# Patient Record
Sex: Male | Born: 1960 | Race: White | Hispanic: No | Smoking: Current some day smoker
Health system: Southern US, Community
[De-identification: ages and names within clinical notes are randomized; demographics above are authoritative.]

## PROBLEM LIST (undated history)

## (undated) DIAGNOSIS — G5600 Carpal tunnel syndrome, unspecified upper limb: Secondary | ICD-10-CM

## (undated) DIAGNOSIS — M1990 Unspecified osteoarthritis, unspecified site: Secondary | ICD-10-CM

## (undated) DIAGNOSIS — M5136 Other intervertebral disc degeneration, lumbar region: Secondary | ICD-10-CM

## (undated) DIAGNOSIS — M79606 Pain in leg, unspecified: Secondary | ICD-10-CM

## (undated) DIAGNOSIS — K219 Gastro-esophageal reflux disease without esophagitis: Secondary | ICD-10-CM

## (undated) DIAGNOSIS — F329 Major depressive disorder, single episode, unspecified: Secondary | ICD-10-CM

## (undated) DIAGNOSIS — G8929 Other chronic pain: Secondary | ICD-10-CM

## (undated) DIAGNOSIS — R519 Headache, unspecified: Secondary | ICD-10-CM

## (undated) DIAGNOSIS — I10 Essential (primary) hypertension: Secondary | ICD-10-CM

## (undated) DIAGNOSIS — M51369 Other intervertebral disc degeneration, lumbar region without mention of lumbar back pain or lower extremity pain: Secondary | ICD-10-CM

## (undated) DIAGNOSIS — G629 Polyneuropathy, unspecified: Secondary | ICD-10-CM

## (undated) DIAGNOSIS — K3184 Gastroparesis: Secondary | ICD-10-CM

## (undated) DIAGNOSIS — F32A Depression, unspecified: Secondary | ICD-10-CM

## (undated) DIAGNOSIS — E785 Hyperlipidemia, unspecified: Secondary | ICD-10-CM

## (undated) DIAGNOSIS — I639 Cerebral infarction, unspecified: Secondary | ICD-10-CM

## (undated) DIAGNOSIS — R51 Headache: Secondary | ICD-10-CM

## (undated) HISTORY — DX: Other chronic pain: G89.29

## (undated) HISTORY — DX: Carpal tunnel syndrome, unspecified upper limb: G56.00

## (undated) HISTORY — PX: CHOLECYSTECTOMY: SHX55

## (undated) HISTORY — DX: Pain in leg, unspecified: M79.606

## (undated) HISTORY — PX: BACK SURGERY: SHX140

## (undated) HISTORY — PX: ANKLE FRACTURE SURGERY: SHX122

## (undated) HISTORY — DX: Hyperlipidemia, unspecified: E78.5

---

## 2004-01-02 ENCOUNTER — Ambulatory Visit (HOSPITAL_COMMUNITY): Admission: RE | Admit: 2004-01-02 | Discharge: 2004-01-02 | Payer: Self-pay | Admitting: Orthopedic Surgery

## 2004-01-16 ENCOUNTER — Encounter: Admission: RE | Admit: 2004-01-16 | Discharge: 2004-01-16 | Payer: Self-pay | Admitting: Orthopedic Surgery

## 2004-02-06 ENCOUNTER — Encounter: Admission: RE | Admit: 2004-02-06 | Discharge: 2004-02-06 | Payer: Self-pay | Admitting: Orthopedic Surgery

## 2004-04-18 ENCOUNTER — Encounter: Admission: RE | Admit: 2004-04-18 | Discharge: 2004-04-18 | Payer: Self-pay | Admitting: Neurosurgery

## 2004-05-14 ENCOUNTER — Ambulatory Visit (HOSPITAL_COMMUNITY): Admission: AD | Admit: 2004-05-14 | Discharge: 2004-05-15 | Payer: Self-pay | Admitting: Neurosurgery

## 2004-06-16 ENCOUNTER — Emergency Department (HOSPITAL_COMMUNITY): Admission: EM | Admit: 2004-06-16 | Discharge: 2004-06-17 | Payer: Self-pay | Admitting: Emergency Medicine

## 2004-06-26 ENCOUNTER — Encounter: Admission: RE | Admit: 2004-06-26 | Discharge: 2004-06-26 | Payer: Self-pay | Admitting: Neurosurgery

## 2004-06-28 ENCOUNTER — Encounter: Admission: RE | Admit: 2004-06-28 | Discharge: 2004-06-28 | Payer: Self-pay | Admitting: Neurosurgery

## 2004-09-18 ENCOUNTER — Encounter: Admission: RE | Admit: 2004-09-18 | Discharge: 2004-09-18 | Payer: Self-pay | Admitting: Neurosurgery

## 2005-01-23 ENCOUNTER — Emergency Department (HOSPITAL_COMMUNITY): Admission: EM | Admit: 2005-01-23 | Discharge: 2005-01-23 | Payer: Self-pay | Admitting: Emergency Medicine

## 2006-08-17 IMAGING — RF DG LUMBAR SPINE 2-3V
1 series · 2 of 2 positions shown · non-contrast
Comparison: none

CLINICAL DATA: L4-5 laminectomy.  PLIF.  
 LUMBAR SPINE ? 2 VIEWS
 Two intraoperative spot fluoroscopic images were obtained and submitted postoperative.  During the film, Dr. Nelago positioned a clamp over the right aspect of the spine for localization.  The frontal projection shows an open surgical wound with retractors in place, bilateral pedicle screws, and interbody graft.  The lateral project demonstrates both pedicle screws in place.
 IMPRESSION
 L4-5 diskectomy with bilateral transfacet and pedicle screws extending into the L5 vertebral body.

[Series 0: selected · 2 of 2 slices shown]
[im 1/2]
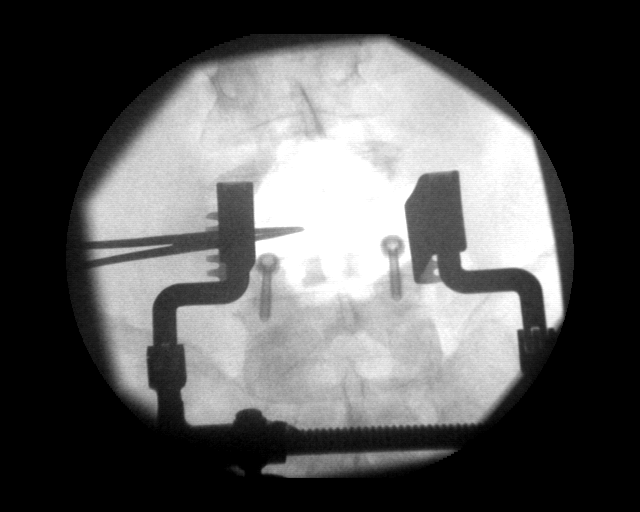
[im 2/2]
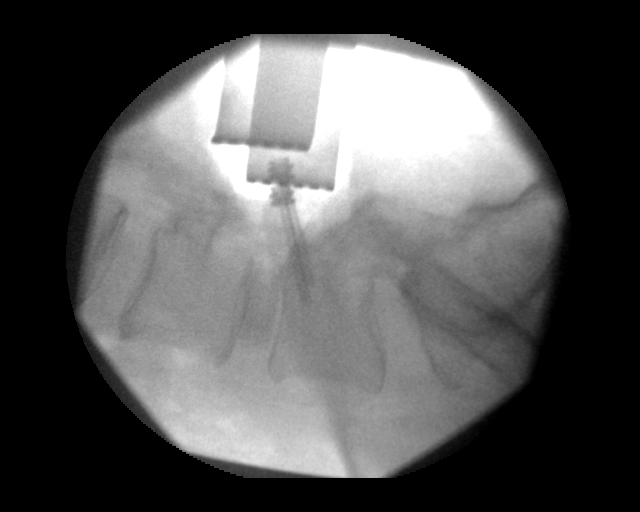

[2 of 2 positions shown; findings below may reference images not displayed]

## 2009-04-08 HISTORY — PX: ESOPHAGOGASTRODUODENOSCOPY: SHX1529

## 2009-04-08 HISTORY — PX: COLONOSCOPY: SHX174

## 2010-09-29 ENCOUNTER — Encounter: Payer: Self-pay | Admitting: Orthopedic Surgery

## 2011-04-17 ENCOUNTER — Other Ambulatory Visit: Payer: Self-pay | Admitting: Orthopedic Surgery

## 2011-04-17 DIAGNOSIS — M545 Low back pain: Secondary | ICD-10-CM

## 2011-04-17 DIAGNOSIS — M79605 Pain in left leg: Secondary | ICD-10-CM

## 2011-04-22 MED ORDER — DIAZEPAM 2 MG PO TABS
10.0000 mg | ORAL_TABLET | Freq: Once | ORAL | Status: AC
  Administered 2011-04-23: 10 mg via ORAL

## 2011-04-23 ENCOUNTER — Ambulatory Visit
Admission: RE | Admit: 2011-04-23 | Discharge: 2011-04-23 | Disposition: A | Discharge status: Home / Self Care | Payer: BC Managed Care – PPO | Source: Ambulatory Visit | Attending: Orthopedic Surgery | Admitting: Orthopedic Surgery

## 2011-04-23 DIAGNOSIS — M79605 Pain in left leg: Secondary | ICD-10-CM

## 2011-04-23 DIAGNOSIS — M545 Low back pain: Secondary | ICD-10-CM

## 2011-04-23 MED ORDER — IOHEXOL 180 MG/ML  SOLN
15.0000 mL | Freq: Once | INTRAMUSCULAR | Status: AC | PRN
  Administered 2011-04-23: 15 mL via INTRATHECAL

## 2011-04-23 MED ORDER — ONDANSETRON HCL 4 MG/2ML IJ SOLN
4.0000 mg | Freq: Once | INTRAMUSCULAR | Status: AC
  Administered 2011-04-23: 4 mg via INTRAMUSCULAR

## 2011-04-23 MED ORDER — HYDROMORPHONE HCL 2 MG/ML IJ SOLN
1.0000 mg | Freq: Once | INTRAMUSCULAR | Status: AC
  Administered 2011-04-23: 1.5 mg via INTRAMUSCULAR

## 2011-06-13 ENCOUNTER — Encounter (HOSPITAL_COMMUNITY)
Admission: RE | Admit: 2011-06-13 | Discharge: 2011-06-13 | Disposition: A | Discharge status: Home / Self Care | Payer: BC Managed Care – PPO | Source: Ambulatory Visit | Attending: Orthopedic Surgery | Admitting: Orthopedic Surgery

## 2011-06-13 ENCOUNTER — Other Ambulatory Visit (HOSPITAL_COMMUNITY): Payer: Self-pay | Admitting: Orthopedic Surgery

## 2011-06-13 DIAGNOSIS — S32009K Unspecified fracture of unspecified lumbar vertebra, subsequent encounter for fracture with nonunion: Secondary | ICD-10-CM

## 2011-06-13 LAB — DIFFERENTIAL
Eosinophils Absolute: 0.2 10*3/uL (ref 0.0–0.7)
Lymphs Abs: 2.6 10*3/uL (ref 0.7–4.0)
Monocytes Relative: 10 % (ref 3–12)
Neutro Abs: 5 10*3/uL (ref 1.7–7.7)
Neutrophils Relative %: 58 % (ref 43–77)

## 2011-06-13 LAB — COMPREHENSIVE METABOLIC PANEL
ALT: 39 U/L (ref 0–53)
Albumin: 3.8 g/dL (ref 3.5–5.2)
Alkaline Phosphatase: 91 U/L (ref 39–117)
BUN: 9 mg/dL (ref 6–23)
Potassium: 5 mEq/L (ref 3.5–5.1)
Sodium: 140 mEq/L (ref 135–145)
Total Protein: 7 g/dL (ref 6.0–8.3)

## 2011-06-13 LAB — URINALYSIS, ROUTINE W REFLEX MICROSCOPIC
Glucose, UA: >1000 mg/dL — AB
Leukocytes, UA: NEGATIVE
pH: 6 (ref 5.0–8.0)

## 2011-06-13 LAB — CBC
Hemoglobin: 14.7 g/dL (ref 13.0–17.0)
MCH: 29.8 pg (ref 26.0–34.0)
MCV: 84 fL (ref 78.0–100.0)
RBC: 4.94 MIL/uL (ref 4.22–5.81)

## 2011-06-13 LAB — TYPE AND SCREEN: Antibody Screen: NEGATIVE

## 2011-06-13 LAB — SURGICAL PCR SCREEN: MRSA, PCR: NEGATIVE

## 2011-06-13 LAB — URINE MICROSCOPIC-ADD ON

## 2011-06-17 ENCOUNTER — Inpatient Hospital Stay (HOSPITAL_COMMUNITY): Payer: BC Managed Care – PPO

## 2011-06-17 ENCOUNTER — Inpatient Hospital Stay (HOSPITAL_COMMUNITY)
Admission: RE | Admit: 2011-06-17 | Discharge: 2011-06-21 | DRG: 756 | Disposition: A | Discharge status: Home / Self Care | Payer: BC Managed Care – PPO | Source: Ambulatory Visit | Attending: Orthopedic Surgery | Admitting: Orthopedic Surgery

## 2011-06-17 DIAGNOSIS — Z794 Long term (current) use of insulin: Secondary | ICD-10-CM

## 2011-06-17 DIAGNOSIS — T84498A Other mechanical complication of other internal orthopedic devices, implants and grafts, initial encounter: Principal | ICD-10-CM | POA: Diagnosis present

## 2011-06-17 DIAGNOSIS — K219 Gastro-esophageal reflux disease without esophagitis: Secondary | ICD-10-CM | POA: Diagnosis present

## 2011-06-17 DIAGNOSIS — Y831 Surgical operation with implant of artificial internal device as the cause of abnormal reaction of the patient, or of later complication, without mention of misadventure at the time of the procedure: Secondary | ICD-10-CM | POA: Diagnosis present

## 2011-06-17 DIAGNOSIS — Z01810 Encounter for preprocedural cardiovascular examination: Secondary | ICD-10-CM

## 2011-06-17 DIAGNOSIS — E119 Type 2 diabetes mellitus without complications: Secondary | ICD-10-CM | POA: Diagnosis present

## 2011-06-17 DIAGNOSIS — Y92009 Unspecified place in unspecified non-institutional (private) residence as the place of occurrence of the external cause: Secondary | ICD-10-CM

## 2011-06-17 DIAGNOSIS — Z01818 Encounter for other preprocedural examination: Secondary | ICD-10-CM

## 2011-06-17 DIAGNOSIS — Z79899 Other long term (current) drug therapy: Secondary | ICD-10-CM

## 2011-06-17 DIAGNOSIS — I10 Essential (primary) hypertension: Secondary | ICD-10-CM | POA: Diagnosis present

## 2011-06-17 DIAGNOSIS — F17200 Nicotine dependence, unspecified, uncomplicated: Secondary | ICD-10-CM | POA: Diagnosis present

## 2011-06-17 DIAGNOSIS — Z01812 Encounter for preprocedural laboratory examination: Secondary | ICD-10-CM

## 2011-06-17 LAB — GLUCOSE, CAPILLARY
Glucose-Capillary: 263 mg/dL — ABNORMAL HIGH (ref 70–99)
Glucose-Capillary: 161 mg/dL — ABNORMAL HIGH (ref 70–99)

## 2011-06-18 LAB — GLUCOSE, CAPILLARY: Glucose-Capillary: 244 mg/dL — ABNORMAL HIGH (ref 70–99)

## 2011-06-18 LAB — HEMOGLOBIN A1C: Mean Plasma Glucose: 269 mg/dL — ABNORMAL HIGH (ref <117)

## 2011-06-19 LAB — GLUCOSE, CAPILLARY
Glucose-Capillary: 207 mg/dL — ABNORMAL HIGH (ref 70–99)
Glucose-Capillary: 198 mg/dL — ABNORMAL HIGH (ref 70–99)
Glucose-Capillary: 209 mg/dL — ABNORMAL HIGH (ref 70–99)

## 2011-06-20 LAB — GLUCOSE, CAPILLARY
Glucose-Capillary: 182 mg/dL — ABNORMAL HIGH (ref 70–99)
Glucose-Capillary: 160 mg/dL — ABNORMAL HIGH (ref 70–99)
Glucose-Capillary: 156 mg/dL — ABNORMAL HIGH (ref 70–99)

## 2011-06-20 NOTE — Op Note (Signed)
NAMEDICKSON, BARFIELD NO.:  [id number]  MEDICAL RECORD NO.:  [id number]  LOCATION:  [id number]                         FACILITY:  [facility]  PHYSICIAN:  Dimas Alexandria, MD      DATE OF BIRTH:  1961/01/06  DATE OF PROCEDURE:  06/17/2011 DATE OF DISCHARGE:                              OPERATIVE REPORT   SURGEON:  Dimas Alexandria, MD  ASSISTANT:  Gerri Lins, PA-C  PREPROCEDURE DIAGNOSIS:  Pseudoarthrosis L4-5, status post facet joint fusion.  POSTPROCEDURE DIAGNOSIS:  Pseudoarthrosis L4-5, status post facet joint fusion.  PROCEDURE:  Removal of previously placed facet screws (unusual service based upon the fact the facet screws were broken and required considerable effort to remove), L4-5 posterior fusion utilizing pedicle screws, rods, autogenous local bone graft, and infuse.  OPERATIVE NOTE:  The patient was placed under general endotracheal anesthesia.  Vancomycin was infused intravenously.  Sequential compression devices were placed on both lower extremities.  Neuro monitoring electrodes were attached by the technician.  Foley catheter was placed in the bladder.  He was positioned prone on a Jackson frame. Care was taken to position the upper extremities so as to avoid hyperflexion and abduction of the shoulders and so as to avoid hyperflexion of the elbows.  Upper extremities were padded with foam axilla to hands.  Thighs, knees, shins, and ankles were supported on pillows.  Hair was clipped from the lumbar area.  The previously made incision was marked with a skin marker.  The lumbar area was prepped with DuraPrep and draped in rectangular fashion.  The drapes were secured with Ioban.  A time-out was held during which the usual information was confirmed/discussed.  Subcutaneous tissue in line with the incision was injected with a mixture of 0.25% Marcaine and 1% lidocaine with epinephrine.  Elliptical incision was made around the previous incision and  was excised. Dissection was carried down to the spinous process above and below the level of the previous fusion.  There was dense scar.  The scar and paraspinal muscles were mobilized bilaterally to reveal hypotrophic bone in the region of the previous facet joints.  The screw heads were not initially visible.  Bone was removed using osteotomes and a rongeur until the heads of both screws, which entered the inferior articular processes bilaterally of L4, were visualized.  We then removed the screws, but in fact it was apparent, not on x-rays taken preoperatively, that the screws had broken where they traversed the facet joint from inferior into superior articular process.  This meant that the distal part of each screw, the fully threaded section, was encased in bone in the inferior articular process and pedicle of the L5 vertebra bilaterally.  Considerable effort was made in removing the screws.  Bone overlying the broken shaft of the screw was removed using high-speed bur and osteotomes and then bone from around the threads removed.  The screwswere cannulated.  On the patient's left side, I was able to insert an easy out into the hole in the screw and to back it out.  On the other side, the easy out broke off in the cannulated screw.  I was able, after removing more bone  around the circumference of the threaded section of the screw to remove it with another __________ version of an easy out.  We then began the process of pedicle screw placement.  This was extremely difficult at L5 bilaterally because of the distortion of the anatomy brought about by the overgrowth of bone around the facet joint. Ultimately a screw hole was placed on the left side at the junction of the superior articular process of L5 and the transverse process, really actually more by identifying the base of the transverse process.  At L4 proximally, it was not difficult to identify the L4 transverse process and the  screw hole was made at the base of the superior articular process and the hole angled somewhat proximally.  Both holes were carefully palpated with a ball-tip probe to make sure that they were circumferentially intact.  They were sounded for depth and screw length was chosen on the basis of the depth measurement.  Each screw hole was tapped with a 6.5 mm tap and then 6.5 mm K2 and pedicle screws inserted, with the length based on our depth measurements.  Each screw was then stimulated and EMG activity distally was elicited at current level which made the likelihood of direct screw thread nerve contact extremely unlikely.  We then contoured a titanium rod and attached it between the screws and torqued the facet screws.  I then moved to the right side of the table where essentially the same procedure was carried out.  The anatomy was again difficult at the L5 level because of the previously placed facet screw and the necessity of removing a great deal of bone from around it in order to extricate it. The pedicle screw holes were made in the L5 and L4 pedicles again, and carefully palpated with a ball-tip probe to make sure they were circumferentially intact and sounded for depth.  Each hole was tapped with a 6.5 mm tap and a 6.5 mm K2M pedicle screw of appropriate length inserted.  Again these screws were stimulated and the current required to elicit distal EMG activity was in a safe zone.  The rod was contoured, coupled, and the couplings torqued.  In the course of removing the facet screws, a fairly radical facet joint preparation have been carried out suitable for fusion.  A good deal of bone graft was harvested and put through a bone mill.  I did aspirate 10 mL of bone marrow from the right posterior iliac crest and mixed it with the milled autograft.  We then placed half of a small package of BMP on either side in the interpedicular area and placed graft on top of that. Gelfoam was  then placed over the graft to prevent it migrating into the wound.  Cross-table lateral radiograph showed satisfactory position of screws. The screw on the left at L5 is directed somewhat inferiorly, but I feel confident that the position is adequate based upon the EMG testing.  On multiple occasions throughout the procedure, antibiotic solution was irrigated through the wound.  A 15-gauge Blake drain was placed subfascially and brought out through the skin to the right side where it was secured with a 2-0 nylon in basket weave fashion.  The thoracolumbar fascia was then closed using interrupted #1 Vicryl sutures.  An 8 inch Hemovac drain was placed in the subcutaneous layer, brought out through the skin to the right side and secured with a 2-0 nylon suture.  The subcutaneous fat was then closed using inverted Vicryl sutures.  The skin was closed using a subcuticular 3-0 undyed Vicryl suture in running fashion.  The skin edges were reinforced with Dermabond.  Mepilex dressing was placed. The sponge and needle counts were correct.  My estimation of blood loss is about 500 mL.  There were no intraoperative complications.  At the time of dictation, I have not yet examined the patient in the recovery room, but will do so as soon as the dictation is complete.     Dimas Alexandria, MD     MT/MEDQ  D:  06/17/2011  T:  06/17/2011  Job:  [id number]  Electronically Signed by Dimas Alexandria MD on 06/20/2011 02:15:39 PM

## 2011-06-21 LAB — GLUCOSE, CAPILLARY

## 2011-06-27 NOTE — Discharge Summary (Signed)
  William Schroeder, William Schroeder NO.:  [id number]  MEDICAL RECORD NO.:  [id number]  LOCATION:  [id number]                         FACILITY:  [facility]  PHYSICIAN:  Dimas Alexandria, MD      DATE OF BIRTH:  [birth year]  DATE OF ADMISSION:  06/17/2011 DATE OF DISCHARGE:  06/21/2011                              DISCHARGE SUMMARY   DISCHARGE DIAGNOSIS:  Pseudoarthrosis L4-5 status post prior fusion.  This man was taken to the operating room the day of admission where a repair pseudoarthrosis and revision of fixation at L4-5 was carried out. Postoperatively, his symptoms improved, not withstanding his postoperative pain.  He had an ileus which persisted until about the third day at which point he had bowel sounds and a very small amount of flatus.  He has had a bowel movement today and hence may go home.  He is ambulatory with a walker.  His incision is stable.  His condition is stable and improved.  He will be seen in the office in 4 weeks' time.  He is to avoid bending and lifting.  He is to report any persistent wound drainage or fever to me. He is being supplied for prescriptions for fentanyl patch, 25 mcg, 1 q.3 days for 1 month; gabapentin 600 mg p.o. b.i.d., 1 month supply repeatable x2; methocarbamol 500 mg, 1-2 every 6 hours as necessary for spasm, 100 tablets; hydrocodone/acetaminophen 10/325 1-2 q.4 hours p.r.n. for breakthrough pain, 120 tablets supplied.     Dimas Alexandria, MD     MT/MEDQ  D:  06/21/2011  T:  06/21/2011  Job:  [id number]  Electronically Signed by Dimas Alexandria MD on 06/27/2011 06:00:16 AM

## 2011-12-29 ENCOUNTER — Other Ambulatory Visit: Payer: Self-pay | Admitting: Orthopedic Surgery

## 2011-12-29 DIAGNOSIS — M549 Dorsalgia, unspecified: Secondary | ICD-10-CM

## 2012-01-01 ENCOUNTER — Ambulatory Visit
Admission: RE | Admit: 2012-01-01 | Discharge: 2012-01-01 | Disposition: A | Discharge status: Home / Self Care | Payer: BC Managed Care – PPO | Source: Ambulatory Visit | Attending: Orthopedic Surgery | Admitting: Orthopedic Surgery

## 2012-01-01 VITALS — BP 160/88 | HR 87

## 2012-01-01 DIAGNOSIS — M549 Dorsalgia, unspecified: Secondary | ICD-10-CM

## 2012-01-01 MED ORDER — DIAZEPAM 5 MG PO TABS
10.0000 mg | ORAL_TABLET | Freq: Once | ORAL | Status: AC
  Administered 2012-01-01: 10 mg via ORAL

## 2012-01-01 MED ORDER — IOHEXOL 180 MG/ML  SOLN
15.0000 mL | Freq: Once | INTRAMUSCULAR | Status: AC | PRN
  Administered 2012-01-01: 15 mL via INTRATHECAL

## 2012-01-01 NOTE — Discharge Instructions (Signed)

## 2012-01-16 ENCOUNTER — Encounter (HOSPITAL_BASED_OUTPATIENT_CLINIC_OR_DEPARTMENT_OTHER)
Admission: RE | Disposition: A | Discharge status: Home / Self Care | Payer: Self-pay | Source: Ambulatory Visit | Attending: Orthopedic Surgery

## 2012-01-16 ENCOUNTER — Ambulatory Visit (HOSPITAL_BASED_OUTPATIENT_CLINIC_OR_DEPARTMENT_OTHER)
Admission: RE | Admit: 2012-01-16 | Discharge: 2012-01-16 | Disposition: A | Discharge status: Home / Self Care | Payer: BC Managed Care – PPO | Source: Ambulatory Visit | Attending: Orthopedic Surgery | Admitting: Orthopedic Surgery

## 2012-01-16 ENCOUNTER — Encounter (HOSPITAL_BASED_OUTPATIENT_CLINIC_OR_DEPARTMENT_OTHER): Payer: Self-pay | Admitting: *Deleted

## 2012-01-16 ENCOUNTER — Ambulatory Visit (HOSPITAL_COMMUNITY): Payer: BC Managed Care – PPO

## 2012-01-16 DIAGNOSIS — M48061 Spinal stenosis, lumbar region without neurogenic claudication: Secondary | ICD-10-CM | POA: Insufficient documentation

## 2012-01-16 DIAGNOSIS — M4807 Spinal stenosis, lumbosacral region: Secondary | ICD-10-CM

## 2012-01-16 DIAGNOSIS — Z981 Arthrodesis status: Secondary | ICD-10-CM | POA: Insufficient documentation

## 2012-01-16 HISTORY — PX: STERIOD INJECTION: SHX5046

## 2012-01-16 HISTORY — DX: Gastro-esophageal reflux disease without esophagitis: K21.9

## 2012-01-16 HISTORY — DX: Unspecified osteoarthritis, unspecified site: M19.90

## 2012-01-16 HISTORY — DX: Essential (primary) hypertension: I10

## 2012-01-16 SURGERY — MINOR STEROID INJECTION
Anesthesia: LOCAL | Site: Back | Laterality: Right | Wound class: Clean

## 2012-01-16 MED ORDER — IOHEXOL 300 MG/ML  SOLN
INTRAMUSCULAR | Status: DC | PRN
  Administered 2012-01-16: 3 mL via EPIDURAL

## 2012-01-16 MED ORDER — METHYLPREDNISOLONE ACETATE 40 MG/ML IJ SUSP
INTRAMUSCULAR | Status: DC | PRN
  Administered 2012-01-16: 17:00:00 via EPIDURAL

## 2012-01-16 SURGICAL SUPPLY — 17 items
BANDAGE ADHESIVE 1X3 (GAUZE/BANDAGES/DRESSINGS) ×2 IMPLANT
CHLORAPREP W/TINT 26ML (MISCELLANEOUS) ×2 IMPLANT
GLOVE BIO SURGEON STRL SZ7 (GLOVE) ×2 IMPLANT
GLOVE SS BIOGEL STRL SZ 8.5 (GLOVE) ×1 IMPLANT
GLOVE SUPERSENSE BIOGEL SZ 8.5 (GLOVE) ×2
NDL SAFETY ECLIPSE 18X1.5 (NEEDLE) IMPLANT
NDL SPNL 22GX3.5 QUINCKE BK (NEEDLE) ×1 IMPLANT
NDL SPNL 22GX5 LNG QUINC BK (NEEDLE) IMPLANT
NDL SPNL 22GX7 QUINCKE BK (NEEDLE) IMPLANT
NEEDLE HYPO 18GX1.5 SHARP (NEEDLE)
NEEDLE SPNL 22GX3.5 QUINCKE BK (NEEDLE) ×2 IMPLANT
NEEDLE SPNL 22GX5 LNG QUINC BK (NEEDLE) IMPLANT
NEEDLE SPNL 22GX7 QUINCKE BK (NEEDLE) IMPLANT
SYR 3ML 23GX1 SAFETY (SYRINGE) ×2 IMPLANT
SYR 5ML LL (SYRINGE) ×2 IMPLANT
SYR CONTROL 10ML LL (SYRINGE) IMPLANT
TOWEL OR 17X24 6PK STRL BLUE (TOWEL DISPOSABLE) ×2 IMPLANT

## 2012-01-16 NOTE — Op Note (Signed)
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 The lumbar area was prepped with chlorhexidine. 2 mL of preservative-free plain quarter percent bupivacaine were mixed with 2 cc of plain preservative-free 1% lidocaine and 40 mg of Depo-Medrol and 5 cc syringe. 3 cc of Omnipaque were drawn into a 3 cc syringe.  Biplane fluoroscopic guidance, the tip of a 22-gauge spinal needle was placed at the axes of the right L3-4 neural foramen. Omnipaque was injected and flowed into the epidural space. 1.5 cc of injectate was delivered. The needle was withdrawn and repositioned at the exit some of the right L5-S1 neural foramen. Omnipaque was injected and flowed distally, but not medially. The tip was repositioned and eventually flow established, as demonstrated by an epidural  blus. 1.5 cc of injectate was delivered. The needle was withdrawn. Digital pressure was applied to the needle tract which bled a small amount. There are no complications.  Injections were done proximally and distal to the L4-5 level because of the posterolateral fusion making it impossible to inject directly L4-5.

## 2012-01-19 ENCOUNTER — Encounter (HOSPITAL_BASED_OUTPATIENT_CLINIC_OR_DEPARTMENT_OTHER): Payer: Self-pay | Admitting: Orthopedic Surgery

## 2012-04-08 ENCOUNTER — Telehealth (HOSPITAL_COMMUNITY): Payer: Self-pay | Admitting: Dietician

## 2012-04-08 NOTE — Telephone Encounter (Signed)
Records reviewed. Pt last attended diabetes class at [facility] on Tuesday, 09/21/2007 at 10:00 AM.  There have been no recent visits for one-on-one counseling with the RD or attendance at [facility] diabetes class. There are no future appointments scheduled.

## 2012-04-08 NOTE — Telephone Encounter (Signed)
Received fax from [place] from [facility] inquiring if pt has been scheduled for an appointment.

## 2012-04-08 NOTE — Telephone Encounter (Signed)
Faxed information to [place] at [facility].

## 2012-07-27 ENCOUNTER — Other Ambulatory Visit (HOSPITAL_COMMUNITY): Payer: Self-pay | Admitting: Family Medicine

## 2012-07-27 DIAGNOSIS — R109 Unspecified abdominal pain: Secondary | ICD-10-CM

## 2012-07-30 ENCOUNTER — Ambulatory Visit (HOSPITAL_COMMUNITY)
Admission: RE | Admit: 2012-07-30 | Discharge: 2012-07-30 | Disposition: A | Discharge status: Home / Self Care | Payer: BC Managed Care – PPO | Source: Ambulatory Visit | Attending: Family Medicine | Admitting: Family Medicine

## 2012-07-30 DIAGNOSIS — R109 Unspecified abdominal pain: Secondary | ICD-10-CM

## 2012-08-02 ENCOUNTER — Ambulatory Visit (INDEPENDENT_AMBULATORY_CARE_PROVIDER_SITE_OTHER): Payer: BC Managed Care – PPO | Admitting: Gastroenterology

## 2012-08-02 ENCOUNTER — Encounter: Payer: Self-pay | Admitting: Gastroenterology

## 2012-08-02 VITALS — BP 158/89 | HR 89 | Temp 98.2°F | Ht 70.0 in | Wt 216.4 lb

## 2012-08-02 DIAGNOSIS — R630 Anorexia: Secondary | ICD-10-CM

## 2012-08-02 DIAGNOSIS — R1013 Epigastric pain: Secondary | ICD-10-CM

## 2012-08-02 DIAGNOSIS — Z800 Family history of malignant neoplasm of digestive organs: Secondary | ICD-10-CM

## 2012-08-02 DIAGNOSIS — R194 Change in bowel habit: Secondary | ICD-10-CM

## 2012-08-02 DIAGNOSIS — R198 Other specified symptoms and signs involving the digestive system and abdomen: Secondary | ICD-10-CM

## 2012-08-02 DIAGNOSIS — R6881 Early satiety: Secondary | ICD-10-CM

## 2012-08-02 DIAGNOSIS — F1120 Opioid dependence, uncomplicated: Secondary | ICD-10-CM

## 2012-08-02 DIAGNOSIS — F1190 Opioid use, unspecified, uncomplicated: Secondary | ICD-10-CM | POA: Insufficient documentation

## 2012-08-02 DIAGNOSIS — K5900 Constipation, unspecified: Secondary | ICD-10-CM | POA: Insufficient documentation

## 2012-08-02 MED ORDER — PEG 3350-KCL-NA BICARB-NACL 420 G PO SOLR: 4000.0000 mL | ORAL | Status: DC

## 2012-08-02 MED ORDER — DEXLANSOPRAZOLE 60 MG PO CPDR: 60.0000 mg | DELAYED_RELEASE_CAPSULE | Freq: Every day | ORAL | Status: DC

## 2012-08-02 MED ORDER — POLYETHYLENE GLYCOL 3350 17 GM/SCOOP PO POWD: ORAL | Status: DC

## 2012-08-02 NOTE — Assessment & Plan Note (Signed)
Two month h/o significant change in bowels with worsening constipation. One BM weekly at the most. Likely due to change in his pain medications. Colonoscopy in near future. Phenergan to augment conscious sedation.  I have discussed the risks, alternatives, benefits with regards to but not limited to the risk of reaction to medication, bleeding, infection, perforation and the patient is agreeable to proceed. Written consent to be obtained.  Start Miralax 17g BID until soft stool, then daily.

## 2012-08-02 NOTE — Progress Notes (Signed)
Faxed to PCP

## 2012-08-02 NOTE — Progress Notes (Signed)
Primary Care Physician:  Leonides Grills, MD  Primary Gastroenterologist:  Garfield Cornea, MD   Chief Complaint  Patient presents with  . Abdominal Pain    HPI:  William Schroeder is a 51 y.o. male here at the request of Dr. Orson Ape for further evaluation of 2 month h/o epigastric pain and change in bowels. He states he was switched to oxycodone in 04/2012 from hydrocodone. He has h/o two back surgeries and after the last one in 2012 he has had chronic leg pain. He reports being on narcotics for year. Over the past couple of months, he has had abrupt change in bowels. He has only one BM weekly. Really not taking anything regularly for constipation. Took "double" dose of Miralax yesterday and had small BM this AM. Pain in epigastric pain/luq sore all the time but worse at other. Sometimes worse with food. No heartburn/indigestion. Not hungry. Early satiety. No dysphagia. No vomiting. Eats a lot of cereal because he tolerates it best. Start Reglan last week. Only taking at bedtime. Did not start Dexilant as prescribed. Denies NSAIDs/ASA use. Brother/sister both pancreatic cancer in their late 69s. No melena, brbpr.   Last colonoscopy/EGD in [place]. Had polyps. Good sedation.   Intentional weight loss over past 8 years since his first back surgery. Used to weight 297lb. 250lb last year. Now 216lb.   Current Outpatient Prescriptions  Medication Sig Dispense Refill  . gabapentin (NEURONTIN) 600 MG tablet Take 600 mg by mouth 2 (two) times daily.      . insulin glargine (LANTUS) 100 UNIT/ML injection Inject 80 Units into the skin at bedtime.      Marland Kitchen lisinopril (PRINIVIL,ZESTRIL) 20 MG tablet Take 20 mg by mouth daily.      . metFORMIN (GLUCOPHAGE) 500 MG tablet Take 500 mg by mouth 2 (two) times daily with a meal.      . metoCLOPramide (REGLAN) 10 MG tablet Take 10 mg by mouth 4 (four) times daily.       Marland Kitchen oxyCODONE (ROXICODONE) 15 MG immediate release tablet Take 15 mg by mouth every 6 (six) hours as  needed.       . simvastatin (ZOCOR) 40 MG tablet Take 40 mg by mouth daily.        Allergies as of 08/02/2012  . (No Known Allergies)    Past Medical History  Diagnosis Date  . Hypertension   . Diabetes mellitus   . GERD (gastroesophageal reflux disease)     not on Nexium for years  . Arthritis   . Chronic leg pain     since last back surgery  . Hyperlipidemia     Past Surgical History  Procedure Date  . Steriod injection 01/16/2012    Procedure: MINOR STEROID INJECTION;  Surgeon: Lowella Grip, MD;  Location: [facility];  Service: Orthopedics;  Laterality: Right;  Epidural Steroid Injection  Right Lumbar 3-4 and Right Lumbar 5 - Sacral 1  . Gallbladder surgery   . Back surgery     two, last one 06/2011    Family History  Problem Relation Age of Onset  . Colon cancer Neg Hx   . Pancreatic cancer Sister   . Pancreatic cancer Brother   . Diabetes Mother   . Heart disease Mother   . Kidney disease Mother   . Diabetes Other     9 siblings    History   Social History  . Marital Status: Married    Spouse Name: N/A  Number of Children: 2  . Years of Education: N/A   Occupational History  . saw [employer]    Social History Main Topics  . Smoking status: Former Smoker -- 1.0 packs/day for 20 years    Types: Cigarettes    Quit date: 12/31/1988  . Smokeless tobacco: Current User  . Alcohol Use: No  . Drug Use: No  . Sexually Active: Not on file   Other Topics Concern  . Not on file   Social History Narrative  . No narrative on file      ROS:  General: Negative for anorexia, weight loss, fever, chills, fatigue, weakness. Eyes: Negative for vision changes.  ENT: Negative for hoarseness, difficulty swallowing , nasal congestion. CV: Negative for chest pain, angina, palpitations, dyspnea on exertion, peripheral edema.  Respiratory: Negative for dyspnea at rest, dyspnea on exertion, cough, sputum, wheezing.  GI: See history  of present illness. GU:  Negative for dysuria, hematuria, urinary incontinence, urinary frequency, nocturnal urination.  MS: Chronic leg/low back pain.  Derm: Negative for rash or itching.  Neuro: Negative for weakness, abnormal sensation, seizure, frequent headaches, memory loss, confusion.  Psych: Negative for anxiety, depression, suicidal ideation, hallucinations.  Endo: Negative for unusual weight change.  Heme: Negative for bruising or bleeding. Allergy: Negative for rash or hives.    Physical Examination:  BP 158/89  Pulse 89  Temp 98.2 F (36.8 C) (Temporal)  Ht 5\' 10"  (1.778 m)  Wt 216 lb 6.4 oz (98.158 kg)  BMI 31.05 kg/m2   General: Well-nourished, well-developed in no acute distress.  Head: Normocephalic, atraumatic.   Eyes: Conjunctiva pink, no icterus. Mouth: Oropharyngeal mucosa moist and pink , no lesions erythema or exudate. Neck: Supple without thyromegaly, masses, or lymphadenopathy.  Lungs: Clear to auscultation bilaterally.  Heart: Regular rate and rhythm, no murmurs rubs or gallops.  Abdomen: Bowel sounds are normal, mild RLQ and epigastric tenderness, nondistended, no hepatosplenomegaly or masses, no abdominal bruits or    hernia , no rebound or guarding.   Rectal: defer Extremities: No lower extremity edema. No clubbing or deformities.  Neuro: Alert and oriented x 4 , grossly normal neurologically.  Skin: Warm and dry, no rash or jaundice.   Psych: Alert and cooperative, normal mood and affect.  Labs: none  Imaging Studies: US Abdomen Complete  07/30/2012  *RADIOLOGY REPORT*  Clinical Data:  Abdominal pain  ABDOMINAL ULTRASOUND COMPLETE  Comparison:  None.  Findings:  Gallbladder:  Surgically absent.  Common Bile Duct:  Within normal limits in caliber.  Liver: Diffusely increased in echogenicity without focal mass.  IVC:  Appears normal.  Pancreas:  No abnormality identified.  Spleen:  Within normal limits in size and echotexture.  Right kidney:  Normal  in size and parenchymal echogenicity.  No evidence of mass or hydronephrosis.  Left kidney:  Normal in size and parenchymal echogenicity.  No evidence of mass or hydronephrosis.  Abdominal Aorta:  Atherosclerotic plaque is noted.  Maximal diameter is 2.5 cm.  IMPRESSION: No acute intra-abdominal pathology.  Diffuse increased echogenicity of the liver likely due to diffuse hepatic steatosis.   Original Report Authenticated By: Marybelle Killings, M.D.

## 2012-08-02 NOTE — Assessment & Plan Note (Addendum)
Two month h/o epigastric pain, early satiety, anorexia. Noted since change from hydrocodone to oxycodone for chronic leg/back pain. On Reglan (once daily) for one week without any noted difference but really not on therapeutic dose. Patient has not been on PPI recently, used to take Nexium chronically. No recent NSAIDS/ASA. He has FH of pancreatic cancer in two siblings in their 78s, which really concernes him.   Recommend EGD for further evaluation. Phenergan 25mg  IV 30 mins before procedure to augment conscious sedation. Will retrieve copy of last EGD/TCS as patient reports he had adequate sedation at that time.  I have discussed the risks, alternatives, benefits with regards to but not limited to the risk of reaction to medication, bleeding, infection, perforation and the patient is agreeable to proceed. Written consent to be obtained.  May ultimately need CT A/P, await EGD findings.  Stop Reglan. CMET, lipase, CBC. Dexilant 60mg  daily before breakfast. Samples provided.

## 2012-08-02 NOTE — Patient Instructions (Signed)
We have scheduled you for an upper endoscopy and colonoscopy with Dr. Gala Romney. Please see separate instructions. Today take her bowel prep, you will need to take half dose of your insulin and metformin. According to medication list you provided today, this would indicate that you should drop your Lantus to 40 units at bedtime, Glucophage to 250 mg twice a day on the day of the bowel prep.  Start MiraLax for your constipation. You should take 17 g or 1 capful twice daily until your bowel movements are soft, then you may back down to once daily dosing. If the medication is not working please let us know as soon as possible.  Please take Dexilant 60mg  once daily before breakfast at least until your procedures. We have provided you will samples today.   Please stop Reglan (metoclopramide) for now.

## 2012-08-03 ENCOUNTER — Encounter: Payer: Self-pay | Admitting: Gastroenterology

## 2012-08-03 ENCOUNTER — Ambulatory Visit: Payer: BC Managed Care – PPO | Admitting: Urology

## 2012-08-03 NOTE — Progress Notes (Addendum)
Reviewed copy of EGD/colonoscopy by Dr. Laural Golden done in 04/2009. Updated PSH. Indication for procedure listed as h/o colonic adenocarcinoma with last exam in 2002.   Spoke with patient. He denies any h/o colon cancer. States he had polyps. We will try to track down 2002 colonoscopy and path in case he had carcinoma in situ and unaware of this.   Called [facility] and [facility] no colonoscopy found around 2002.

## 2012-08-04 ENCOUNTER — Encounter (HOSPITAL_COMMUNITY): Payer: Self-pay | Admitting: Pharmacy Technician

## 2012-08-07 LAB — COMPREHENSIVE METABOLIC PANEL
AST: 17 U/L (ref 0–37)
Alkaline Phosphatase: 95 U/L (ref 39–117)
BUN: 11 mg/dL (ref 6–23)
Creat: 0.83 mg/dL (ref 0.50–1.35)
Glucose, Bld: 257 mg/dL — ABNORMAL HIGH (ref 70–99)
Total Bilirubin: 0.4 mg/dL (ref 0.3–1.2)

## 2012-08-07 LAB — CBC WITH DIFFERENTIAL/PLATELET
Basophils Relative: 1 % (ref 0–1)
Eosinophils Relative: 1 % (ref 0–5)
Hemoglobin: 15.4 g/dL (ref 13.0–17.0)
Lymphocytes Relative: 30 % (ref 12–46)
MCH: 29.2 pg (ref 26.0–34.0)
Monocytes Relative: 2 % — ABNORMAL LOW (ref 3–12)
Neutrophils Relative %: 66 % (ref 43–77)
Platelets: 289 10*3/uL (ref 150–400)
RBC: 5.27 MIL/uL (ref 4.22–5.81)
WBC: 12.2 10*3/uL — ABNORMAL HIGH (ref 4.0–10.5)

## 2012-08-10 NOTE — Progress Notes (Signed)
Quick Note:  WBC up just a little. Has he had any fever or worsening of abdominal pain? Repeat CBC in four weeks.  TCS/EGD as planned. ______

## 2012-08-11 ENCOUNTER — Other Ambulatory Visit: Payer: Self-pay | Admitting: Gastroenterology

## 2012-08-11 MED ORDER — LUBIPROSTONE 24 MCG PO CAPS: 24.0000 ug | ORAL_CAPSULE | Freq: Two times a day (BID) | ORAL | Status: DC

## 2012-08-11 NOTE — Progress Notes (Signed)
Quick Note:  Don't forget to order the CBC in four weeks.  I sent RX for Amitiza 53mcg BID to [facility]. He should start Amitiza BID. Once good BMs, then taper off Miralax and use it only prn. ______

## 2012-08-12 ENCOUNTER — Other Ambulatory Visit: Payer: Self-pay | Admitting: Gastroenterology

## 2012-08-12 ENCOUNTER — Other Ambulatory Visit: Payer: Self-pay

## 2012-08-12 DIAGNOSIS — D72829 Elevated white blood cell count, unspecified: Secondary | ICD-10-CM

## 2012-08-17 MED ORDER — SODIUM CHLORIDE 0.45 % IV SOLN
INTRAVENOUS | Status: DC
  Administered 2012-08-18: 08:00:00 via INTRAVENOUS

## 2012-08-18 ENCOUNTER — Other Ambulatory Visit: Payer: Self-pay | Admitting: Internal Medicine

## 2012-08-18 ENCOUNTER — Encounter (HOSPITAL_COMMUNITY): Payer: Self-pay | Admitting: *Deleted

## 2012-08-18 ENCOUNTER — Encounter (HOSPITAL_COMMUNITY)
Admission: RE | Disposition: A | Discharge status: Home / Self Care | Payer: Self-pay | Source: Ambulatory Visit | Attending: Internal Medicine

## 2012-08-18 ENCOUNTER — Ambulatory Visit (HOSPITAL_COMMUNITY)
Admission: RE | Admit: 2012-08-18 | Discharge: 2012-08-18 | Disposition: A | Discharge status: Home / Self Care | Payer: BC Managed Care – PPO | Source: Ambulatory Visit | Attending: Internal Medicine | Admitting: Internal Medicine

## 2012-08-18 DIAGNOSIS — R194 Change in bowel habit: Secondary | ICD-10-CM

## 2012-08-18 DIAGNOSIS — K219 Gastro-esophageal reflux disease without esophagitis: Secondary | ICD-10-CM | POA: Insufficient documentation

## 2012-08-18 DIAGNOSIS — R1013 Epigastric pain: Secondary | ICD-10-CM

## 2012-08-18 DIAGNOSIS — R933 Abnormal findings on diagnostic imaging of other parts of digestive tract: Secondary | ICD-10-CM

## 2012-08-18 DIAGNOSIS — K449 Diaphragmatic hernia without obstruction or gangrene: Secondary | ICD-10-CM | POA: Insufficient documentation

## 2012-08-18 DIAGNOSIS — K5900 Constipation, unspecified: Secondary | ICD-10-CM

## 2012-08-18 DIAGNOSIS — R198 Other specified symptoms and signs involving the digestive system and abdomen: Secondary | ICD-10-CM | POA: Insufficient documentation

## 2012-08-18 DIAGNOSIS — R630 Anorexia: Secondary | ICD-10-CM

## 2012-08-18 DIAGNOSIS — K5730 Diverticulosis of large intestine without perforation or abscess without bleeding: Secondary | ICD-10-CM

## 2012-08-18 DIAGNOSIS — R6881 Early satiety: Secondary | ICD-10-CM

## 2012-08-18 DIAGNOSIS — E119 Type 2 diabetes mellitus without complications: Secondary | ICD-10-CM | POA: Insufficient documentation

## 2012-08-18 DIAGNOSIS — I10 Essential (primary) hypertension: Secondary | ICD-10-CM | POA: Insufficient documentation

## 2012-08-18 DIAGNOSIS — R634 Abnormal weight loss: Secondary | ICD-10-CM

## 2012-08-18 DIAGNOSIS — Z86010 Personal history of colonic polyps: Secondary | ICD-10-CM

## 2012-08-18 DIAGNOSIS — Z800 Family history of malignant neoplasm of digestive organs: Secondary | ICD-10-CM

## 2012-08-18 DIAGNOSIS — F1190 Opioid use, unspecified, uncomplicated: Secondary | ICD-10-CM

## 2012-08-18 HISTORY — PX: COLONOSCOPY WITH ESOPHAGOGASTRODUODENOSCOPY (EGD): SHX5779

## 2012-08-18 LAB — GLUCOSE, CAPILLARY

## 2012-08-18 SURGERY — COLONOSCOPY WITH ESOPHAGOGASTRODUODENOSCOPY (EGD)
Anesthesia: Moderate Sedation

## 2012-08-18 MED ORDER — PROMETHAZINE HCL 25 MG/ML IJ SOLN
INTRAMUSCULAR | Status: AC
  Filled 2012-08-18: qty 1

## 2012-08-18 MED ORDER — SODIUM CHLORIDE 0.9 % IJ SOLN
INTRAMUSCULAR | Status: AC
  Filled 2012-08-18: qty 10

## 2012-08-18 MED ORDER — PROMETHAZINE HCL 25 MG/ML IJ SOLN
25.0000 mg | Freq: Once | INTRAMUSCULAR | Status: AC
  Administered 2012-08-18: 25 mg via INTRAVENOUS

## 2012-08-18 MED ORDER — STERILE WATER FOR IRRIGATION IR SOLN
Status: DC | PRN
  Administered 2012-08-18: 09:00:00

## 2012-08-18 MED ORDER — MIDAZOLAM HCL 5 MG/5ML IJ SOLN
INTRAMUSCULAR | Status: DC | PRN
  Administered 2012-08-18 (×3): 1 mg via INTRAVENOUS
  Administered 2012-08-18: 2 mg via INTRAVENOUS
  Administered 2012-08-18: 1 mg via INTRAVENOUS

## 2012-08-18 MED ORDER — MEPERIDINE HCL 100 MG/ML IJ SOLN
INTRAMUSCULAR | Status: AC
  Filled 2012-08-18: qty 2

## 2012-08-18 MED ORDER — MEPERIDINE HCL 100 MG/ML IJ SOLN
INTRAMUSCULAR | Status: DC | PRN
  Administered 2012-08-18: 50 mg via INTRAVENOUS
  Administered 2012-08-18 (×2): 25 mg via INTRAVENOUS

## 2012-08-18 MED ORDER — MIDAZOLAM HCL 5 MG/5ML IJ SOLN
INTRAMUSCULAR | Status: AC
  Filled 2012-08-18: qty 10

## 2012-08-18 MED ORDER — BUTAMBEN-TETRACAINE-BENZOCAINE 2-2-14 % EX AERO
INHALATION_SPRAY | CUTANEOUS | Status: DC | PRN
  Administered 2012-08-18: 2 via TOPICAL

## 2012-08-18 NOTE — Op Note (Signed)
[facility] [address] [place], [address]   ENDOSCOPY PROCEDURE REPORT  PATIENT: William, Schroeder  MR#: [id number] BIRTHDATE: 1961/01/02 , 30  yrs. old GENDER: Male ENDOSCOPIST: R.  Garfield Cornea, MD FACP FACG REFERRED BY:  Elsie Lincoln, M.D. PROCEDURE DATE:  08/18/2012 PROCEDURE:     EGD with esophageal and gastric biopsy  INDICATIONS:    Early satiety; weight loss. GERD. Reported hemoglobin is A1c recently 9 and 13 (per patient report)  INFORMED CONSENT:   The risks, benefits, limitations, alternatives and imponderables have been discussed.  The potential for biopsy, esophogeal dilation, etc. have also been reviewed.  Questions have been answered.  All parties agreeable.  Please see the history and physical in the medical record for more information.  MEDICATIONS:   Versed 5 mg IV and Demerol 75 mg IV in divided doses. Cetacaine spray.  DESCRIPTION OF PROCEDURE:   The [id number]  endoscope was introduced through the mouth and advanced to the second portion of the duodenum without difficulty or limitations.  The mucosal surfaces were surveyed very carefully during advancement of the scope and upon withdrawal.  Retroflexion view of the proximal stomach and esophagogastric junction was performed.      FINDINGS: 3 cm "tongue"of salmon-colored epithelium coming up above the GE junction. No esophagitis. Tubular esophagus patent throughout its course. Stomach empty. Small hiatal hernia. Gastric erosions involving the antrum and the body.Pylorus patent. Examination bulb and second portion revealed bulbar erosions only. No ulcer or infiltrating process seen.  THERAPEUTIC / DIAGNOSTIC MANEUVERS PERFORMED:  The abnormal distal esophagus was biopsied for histologic study. Subsequently, the abnormal gastric because was also biopsied.   COMPLICATIONS:  None  IMPRESSION:  Abnormal distal esophagus-query short segment Barrett's esophagus-status post  biopsy. Hiatal hernia. Gastric and bulbar erosions-status post biopsy.  RECOMMENDATIONS:  Followup on pathology.  See colonoscopy report.   _______________________________ R. Garfield Cornea, MD FACP [facility] eSigned:  R. Garfield Cornea, MD FACP [facility] 08/18/2012 9:18 AM     CC:

## 2012-08-18 NOTE — Interval H&P Note (Signed)
History and Physical Interval Note:  08/18/2012 8:42 AM  William Schroeder  has presented today for surgery, with the diagnosis of EPIGASTRIC PAIN, CHANGE IN BOWEL HABITS, ANOREXIA , EARLY SATIETY  The various methods of treatment have been discussed with the patient and family. After consideration of risks, benefits and other options for treatment, the patient has consented to  Procedure(s) (LRB) with comments: COLONOSCOPY WITH ESOPHAGOGASTRODUODENOSCOPY (EGD) (N/A) - 8:30/GIVE PHENERGAN 25MG  IV 30MINS PRIOR TO PROCEDURE as a surgical intervention .  The patient's history has been reviewed, patient examined, no change in status, stable for surgery.  I have reviewed the patient's chart and labs.  Questions were answered to the patient's satisfaction.     Manus Rudd  As above. Patient reports last hemoglobin A1c at 9 the one before that 13 EGD and colonoscopy per plan.The risks, benefits, limitations, imponderables and alternatives regarding both EGD and colonoscopy have been reviewed with the patient. Questions have been answered. All parties agreeable.

## 2012-08-18 NOTE — Op Note (Signed)
[facility] [address] [place], [address]   COLONOSCOPY PROCEDURE REPORT  PATIENT: William, Schroeder  MR#:         [id number] BIRTHDATE: Jan 12, 1961 , 38  yrs. old GENDER: Male ENDOSCOPIST: R.  Garfield Cornea, MD FACP FACG REFERRED BY:  Elsie Lincoln, M.D. PROCEDURE DATE:  08/18/2012 PROCEDURE:     Colonoscopy-diagnostic  INDICATIONS: Change in bowel habits-history of colonic polyps  INFORMED CONSENT:  The risks, benefits, alternatives and imponderables including but not limited to bleeding, perforation as well as the possibility of a missed lesion have been reviewed.  The potential for biopsy, lesion removal, etc. have also been discussed.  Questions have been answered.  All parties agreeable. Please see the history and physical in the medical record for more information.   Medications:  Versed 6 mg IV and Demerol 100 mg IV in divided doses. DESCRIPTION OF PROCEDURE:  After a digital rectal exam was performed, the [id number] and Pentax Colonoscope [id number] colonoscope was advanced from the anus through the rectum and colon to the area of the cecum, ileocecal valve and appendiceal orifice. The cecum was deeply intubated.  These structures were well-seen and photographed for the record.  From the level of the cecum and ileocecal valve, the scope was slowly and cautiously withdrawn. The mucosal surfaces were carefully surveyed utilizing scope tip deflection to facilitate fold flattening as needed.  The scope was pulled down into the rectum where a thorough examination including retroflexion was performed.    FINDINGS:  Adequate preparation. Normal rectum. Few scattered pancolonic diverticula; the remainder of the colonic mucosa appeared normal. The distal 10 cm of terminal ileal mucosa appeared normal.  THERAPEUTIC / DIAGNOSTIC MANEUVERS PERFORMED:  None  COMPLICATIONS: None  CECAL WITHDRAWAL TIME:  14 minutes  IMPRESSION:  Colonic  diverticulosis-few  RECOMMENDATIONS: Repeat colonoscopy for surveillance purposes in 5 years. See EGD report. We'll stop Amitiza; begin Linzess 290 mcg capsule daily.   _______________________________ eSigned:  R. Garfield Cornea, MD FACP [facility] 08/18/2012 9:50 AM   CC:    PATIENT NAME:  William, Schroeder MR#: [id number]

## 2012-08-18 NOTE — H&P (View-Only) (Signed)
Primary Care Physician:  Leonides Grills, MD  Primary Gastroenterologist:  Garfield Cornea, MD   Chief Complaint  Patient presents with  . Abdominal Pain    HPI:  William Schroeder is a 51 y.o. male here at the request of Dr. Orson Ape for further evaluation of 2 month h/o epigastric pain and change in bowels. He states he was switched to oxycodone in 04/2012 from hydrocodone. He has h/o two back surgeries and after the last one in 2012 he has had chronic leg pain. He reports being on narcotics for year. Over the past couple of months, he has had abrupt change in bowels. He has only one BM weekly. Really not taking anything regularly for constipation. Took "double" dose of Miralax yesterday and had small BM this AM. Pain in epigastric pain/luq sore all the time but worse at other. Sometimes worse with food. No heartburn/indigestion. Not hungry. Early satiety. No dysphagia. No vomiting. Eats a lot of cereal because he tolerates it best. Start Reglan last week. Only taking at bedtime. Did not start Dexilant as prescribed. Denies NSAIDs/ASA use. Brother/sister both pancreatic cancer in their late 76s. No melena, brbpr.   Last colonoscopy/EGD in [place]. Had polyps. Good sedation.   Intentional weight loss over past 8 years since his first back surgery. Used to weight 297lb. 250lb last year. Now 216lb.   Current Outpatient Prescriptions  Medication Sig Dispense Refill  . gabapentin (NEURONTIN) 600 MG tablet Take 600 mg by mouth 2 (two) times daily.      . insulin glargine (LANTUS) 100 UNIT/ML injection Inject 80 Units into the skin at bedtime.      Marland Kitchen lisinopril (PRINIVIL,ZESTRIL) 20 MG tablet Take 20 mg by mouth daily.      . metFORMIN (GLUCOPHAGE) 500 MG tablet Take 500 mg by mouth 2 (two) times daily with a meal.      . metoCLOPramide (REGLAN) 10 MG tablet Take 10 mg by mouth 4 (four) times daily.       Marland Kitchen oxyCODONE (ROXICODONE) 15 MG immediate release tablet Take 15 mg by mouth every 6 (six) hours as  needed.       . simvastatin (ZOCOR) 40 MG tablet Take 40 mg by mouth daily.        Allergies as of 08/02/2012  . (No Known Allergies)    Past Medical History  Diagnosis Date  . Hypertension   . Diabetes mellitus   . GERD (gastroesophageal reflux disease)     not on Nexium for years  . Arthritis   . Chronic leg pain     since last back surgery  . Hyperlipidemia     Past Surgical History  Procedure Date  . Steriod injection 01/16/2012    Procedure: MINOR STEROID INJECTION;  Surgeon: Lowella Grip, MD;  Location: [facility];  Service: Orthopedics;  Laterality: Right;  Epidural Steroid Injection  Right Lumbar 3-4 and Right Lumbar 5 - Sacral 1  . Gallbladder surgery   . Back surgery     two, last one 06/2011    Family History  Problem Relation Age of Onset  . Colon cancer Neg Hx   . Pancreatic cancer Sister   . Pancreatic cancer Brother   . Diabetes Mother   . Heart disease Mother   . Kidney disease Mother   . Diabetes Other     9 siblings    History   Social History  . Marital Status: Married    Spouse Name: N/A  Number of Children: 2  . Years of Education: N/A   Occupational History  . saw [employer]    Social History Main Topics  . Smoking status: Former Smoker -- 1.0 packs/day for 20 years    Types: Cigarettes    Quit date: 12/31/1988  . Smokeless tobacco: Current User  . Alcohol Use: No  . Drug Use: No  . Sexually Active: Not on file   Other Topics Concern  . Not on file   Social History Narrative  . No narrative on file      ROS:  General: Negative for anorexia, weight loss, fever, chills, fatigue, weakness. Eyes: Negative for vision changes.  ENT: Negative for hoarseness, difficulty swallowing , nasal congestion. CV: Negative for chest pain, angina, palpitations, dyspnea on exertion, peripheral edema.  Respiratory: Negative for dyspnea at rest, dyspnea on exertion, cough, sputum, wheezing.  GI: See history  of present illness. GU:  Negative for dysuria, hematuria, urinary incontinence, urinary frequency, nocturnal urination.  MS: Chronic leg/low back pain.  Derm: Negative for rash or itching.  Neuro: Negative for weakness, abnormal sensation, seizure, frequent headaches, memory loss, confusion.  Psych: Negative for anxiety, depression, suicidal ideation, hallucinations.  Endo: Negative for unusual weight change.  Heme: Negative for bruising or bleeding. Allergy: Negative for rash or hives.    Physical Examination:  BP 158/89  Pulse 89  Temp 98.2 F (36.8 C) (Temporal)  Ht 5\' 10"  (1.778 m)  Wt 216 lb 6.4 oz (98.158 kg)  BMI 31.05 kg/m2   General: Well-nourished, well-developed in no acute distress.  Head: Normocephalic, atraumatic.   Eyes: Conjunctiva pink, no icterus. Mouth: Oropharyngeal mucosa moist and pink , no lesions erythema or exudate. Neck: Supple without thyromegaly, masses, or lymphadenopathy.  Lungs: Clear to auscultation bilaterally.  Heart: Regular rate and rhythm, no murmurs rubs or gallops.  Abdomen: Bowel sounds are normal, mild RLQ and epigastric tenderness, nondistended, no hepatosplenomegaly or masses, no abdominal bruits or    hernia , no rebound or guarding.   Rectal: defer Extremities: No lower extremity edema. No clubbing or deformities.  Neuro: Alert and oriented x 4 , grossly normal neurologically.  Skin: Warm and dry, no rash or jaundice.   Psych: Alert and cooperative, normal mood and affect.  Labs: none  Imaging Studies: US Abdomen Complete  07/30/2012  *RADIOLOGY REPORT*  Clinical Data:  Abdominal pain  ABDOMINAL ULTRASOUND COMPLETE  Comparison:  None.  Findings:  Gallbladder:  Surgically absent.  Common Bile Duct:  Within normal limits in caliber.  Liver: Diffusely increased in echogenicity without focal mass.  IVC:  Appears normal.  Pancreas:  No abnormality identified.  Spleen:  Within normal limits in size and echotexture.  Right kidney:  Normal  in size and parenchymal echogenicity.  No evidence of mass or hydronephrosis.  Left kidney:  Normal in size and parenchymal echogenicity.  No evidence of mass or hydronephrosis.  Abdominal Aorta:  Atherosclerotic plaque is noted.  Maximal diameter is 2.5 cm.  IMPRESSION: No acute intra-abdominal pathology.  Diffuse increased echogenicity of the liver likely due to diffuse hepatic steatosis.   Original Report Authenticated By: Marybelle Killings, M.D.

## 2012-08-19 ENCOUNTER — Ambulatory Visit (HOSPITAL_COMMUNITY): Payer: BC Managed Care – PPO

## 2012-08-23 ENCOUNTER — Encounter (HOSPITAL_COMMUNITY): Payer: Self-pay | Admitting: Internal Medicine

## 2012-08-26 ENCOUNTER — Encounter: Payer: Self-pay | Admitting: Internal Medicine

## 2012-08-26 ENCOUNTER — Encounter: Payer: Self-pay | Admitting: *Deleted

## 2012-10-04 ENCOUNTER — Telehealth: Payer: Self-pay | Admitting: Urgent Care

## 2012-10-04 NOTE — Telephone Encounter (Signed)
Please call pt & remind him to get CBC. Thanks

## 2012-10-07 NOTE — Telephone Encounter (Signed)
Tried to call pt- LMOM 

## 2012-10-11 NOTE — Telephone Encounter (Signed)
Mailed letter to pt

## 2012-10-26 ENCOUNTER — Encounter: Payer: Self-pay | Admitting: Gastroenterology

## 2012-10-26 ENCOUNTER — Ambulatory Visit (INDEPENDENT_AMBULATORY_CARE_PROVIDER_SITE_OTHER): Payer: BC Managed Care – PPO | Admitting: Gastroenterology

## 2012-10-26 ENCOUNTER — Other Ambulatory Visit: Payer: Self-pay | Admitting: Gastroenterology

## 2012-10-26 VITALS — BP 126/70 | HR 87 | Temp 97.9°F | Ht 70.0 in | Wt 218.8 lb

## 2012-10-26 DIAGNOSIS — R630 Anorexia: Secondary | ICD-10-CM

## 2012-10-26 DIAGNOSIS — R1013 Epigastric pain: Secondary | ICD-10-CM

## 2012-10-26 DIAGNOSIS — K2270 Barrett's esophagus without dysplasia: Secondary | ICD-10-CM

## 2012-10-26 DIAGNOSIS — K5900 Constipation, unspecified: Secondary | ICD-10-CM

## 2012-10-26 DIAGNOSIS — R6881 Early satiety: Secondary | ICD-10-CM

## 2012-10-26 DIAGNOSIS — Z800 Family history of malignant neoplasm of digestive organs: Secondary | ICD-10-CM

## 2012-10-26 MED ORDER — LUBIPROSTONE 24 MCG PO CAPS: 24.0000 ug | ORAL_CAPSULE | Freq: Two times a day (BID) | ORAL | Status: DC

## 2012-10-26 MED ORDER — PANTOPRAZOLE SODIUM 40 MG PO TBEC: 40.0000 mg | DELAYED_RELEASE_TABLET | Freq: Two times a day (BID) | ORAL | Status: DC

## 2012-10-26 NOTE — Patient Instructions (Addendum)
For reflux: It is important you take Protonix twice a day for reflux. You will need another upper endoscopy in June 2014.  For constipation: Start taking Amitiza 1 capsule WITH FOOD twice a day. I have sent refills to your pharmacy and provided samples.  Decrease the Reglan to twice a day for now; hopefully we can taper this off if you do well with Protonix twice a day.   I have scheduled a CT scan of your belly. DO NOT TAKE METFORMIN the day of your scan OR for 48 hours following the scan. You may resume it after 48 hours has passed.

## 2012-10-26 NOTE — Progress Notes (Signed)
Referring Provider: Elsie Lincoln, MD Primary Care Physician:  Leonides Grills, MD Primary Gastroenterologist: Dr. Gala Romney   Chief Complaint  Patient presents with  . Abdominal Pain    6-7 weeks  . Emesis    HPI:   William Schroeder is a 52 year old male who presents today in follow-up after colonoscopy and upper endoscopy. Found to have biopsy-proven Barrett's, and he will need surveillance in June 2014. Not currently taking any PPI. States Reglan increased by PCP on Friday to QID, improved N/V since then. Prior to that, N/V several times per day. Notes epigastric pain, LUQ soreness, noted as constant. States he is only able to eat sweets. Would love to eat a steak. Has taken Nexium many years ago. Not taking Amitiza. Not taking Linzess. Pt poor historian. Appears non-compliance may be secondary to miscommunication, not understanding plan of care. BM "not much", maybe once a week. Did not complete CT scan that appears to have been ordered around time of procedures.    Past Medical History  Diagnosis Date  . Hypertension   . Diabetes mellitus   . GERD (gastroesophageal reflux disease)     not on Nexium for years  . Arthritis   . Chronic leg pain     since last back surgery  . Hyperlipidemia     Past Surgical History  Procedure Laterality Date  . Steriod injection  01/16/2012    Procedure: MINOR STEROID INJECTION;  Surgeon: Lowella Grip, MD;  Location: [facility];  Service: Orthopedics;  Laterality: Right;  Epidural Steroid Injection  Right Lumbar 3-4 and Right Lumbar 5 - Sacral 1  . Gallbladder surgery    . Back surgery      two, last one 06/2011  . Esophagogastroduodenoscopy  04/2009    Dr. Jearld Shines 8mg IV/Demerol 50mg IV. erosive reflux esophagitis with single patch of salmon colored mucosa at distal esophagus. bx-no definite Barrett's  . Colonoscopy  04/2009    Dr. Hulda Humphrey diverticulum at ascending colon  . Colonoscopy with  esophagogastroduodenoscopy (egd)  08/18/2012    Dr. Gala Romney: TCS with few colonic diverticulosis, , EGD with short segment biopsy-proven Barrett's, gastric and bulbar erosions. Negative H.pylori. Low-grade dysplasia of Barrett's.     Current Outpatient Prescriptions  Medication Sig Dispense Refill  . CARAFATE 1 GM/10ML suspension Take by mouth 2 (two) times daily.      Marland Kitchen gabapentin (NEURONTIN) 600 MG tablet Take 600 mg by mouth 2 (two) times daily.      . insulin glargine (LANTUS) 100 UNIT/ML injection Inject 80 Units into the skin at bedtime.      Marland Kitchen lisinopril (PRINIVIL,ZESTRIL) 20 MG tablet Take 20 mg by mouth daily.      . metFORMIN (GLUCOPHAGE) 500 MG tablet Take 500 mg by mouth 2 (two) times daily with a meal.      . metoCLOPramide (REGLAN) 10 MG tablet Take 10 mg by mouth 2 (two) times daily.      Marland Kitchen oxyCODONE (ROXICODONE) 15 MG immediate release tablet Take 15 mg by mouth every 6 (six) hours as needed. Pain.      . polyethylene glycol powder (GLYCOLAX/MIRALAX) powder Take 17 g by mouth daily. 17g po bid until soft stools and then daily.      . simvastatin (ZOCOR) 40 MG tablet Take 40 mg by mouth daily.      Marland Kitchen lubiprostone (AMITIZA) 24 MCG capsule Take 1 capsule (24 mcg total) by mouth 2 (two) times daily with a meal.  60 capsule  5  .  polyethylene glycol-electrolytes (TRILYTE) 420 G solution Take 4,000 mLs by mouth as directed.  4000 mL  0   No current facility-administered medications for this visit.    Allergies as of 10/26/2012  . (No Known Allergies)    Family History  Problem Relation Age of Onset  . Colon cancer Other     uncle  . Pancreatic cancer Sister   . Pancreatic cancer Brother   . Diabetes Mother   . Heart disease Mother   . Kidney disease Mother   . Diabetes Other     9 siblings    History   Social History  . Marital Status: Married    Spouse Name: N/A    Number of Children: 2  . Years of Education: N/A   Occupational History  . saw [employer]    Social History Main Topics  . Smoking status: Former Smoker -- 1.00 packs/day for 20 years    Types: Cigarettes    Quit date: 12/31/1988  . Smokeless tobacco: Current User    Types: Snuff  . Alcohol Use: No  . Drug Use: No  . Sexually Active: None   Other Topics Concern  . None   Social History Narrative  . None    Review of Systems: Negative unless mentioned in HPI  Physical Exam: BP 126/70  Pulse 87  Temp(Src) 97.9 F (36.6 C) (Oral)  Ht 5\' 10"  (1.778 m)  Wt 218 lb 12.8 oz (99.247 kg)  BMI 31.39 kg/m2 General:   Alert and oriented. No distress noted. Pleasant and cooperative.  Head:  Normocephalic and atraumatic. Eyes:  Conjuctiva clear without scleral icterus. Mouth:  Oral mucosa pink and moist. Good dentition. No lesions. Heart:  S1, S2 present without murmurs, rubs, or gallops. Regular rate and rhythm. Abdomen:  +BS, soft, non-tender and non-distended. No rebound or guarding. No HSM or masses noted. Msk:  Symmetrical without gross deformities. Normal posture. Extremities:  Without edema. Neurologic:  Alert and  oriented x4;  grossly normal neurologically. Skin:  Intact without significant lesions or rashes. Psych:  Alert and cooperative. Normal mood and affect.

## 2012-10-27 LAB — CREATININE, SERUM: Creat: 0.84 mg/dL (ref 0.50–1.35)

## 2012-10-27 NOTE — Assessment & Plan Note (Signed)
52 year old with epigastric and LUQ soreness, described as constant. Also notes N/V several times per day improved with Reglan. No GES on file, Reglan provided by PCP. He is not on a PPI, although he was recently found to have Barrett's esophagus on EGD Dec 2013 and this was prescribed. Compliance is an issue. May have underlying diabetic gastroparesis. Need to restart PPI at BID.   CT abd/pelvis as previously ordered PPI BID Decrease Reglan to BID, hopefully wean in near future

## 2012-10-27 NOTE — Assessment & Plan Note (Signed)
Pt poor historian. Unclear if he tried Amitiza or Linzess.   Trial of Amitiza 24 mcg BID May follow-up Next colonoscopy in 2018

## 2012-10-27 NOTE — Progress Notes (Signed)
Faxed to PCP

## 2012-10-27 NOTE — Assessment & Plan Note (Signed)
Surveillance EGD June 2014. Return in May for follow-up.

## 2012-10-27 NOTE — Assessment & Plan Note (Signed)
Due to continued abdominal pain, CT abd/pelvis as previously recommended.

## 2012-10-29 ENCOUNTER — Other Ambulatory Visit: Payer: Self-pay | Admitting: Internal Medicine

## 2012-10-29 ENCOUNTER — Ambulatory Visit (HOSPITAL_COMMUNITY)
Admission: RE | Admit: 2012-10-29 | Discharge: 2012-10-29 | Disposition: A | Discharge status: Home / Self Care | Payer: BC Managed Care – PPO | Source: Ambulatory Visit | Attending: Internal Medicine | Admitting: Internal Medicine

## 2012-10-29 DIAGNOSIS — R1013 Epigastric pain: Secondary | ICD-10-CM

## 2012-10-29 DIAGNOSIS — R630 Anorexia: Secondary | ICD-10-CM

## 2012-10-29 DIAGNOSIS — R6881 Early satiety: Secondary | ICD-10-CM | POA: Insufficient documentation

## 2012-10-29 DIAGNOSIS — R160 Hepatomegaly, not elsewhere classified: Secondary | ICD-10-CM | POA: Insufficient documentation

## 2012-10-29 MED ORDER — IOHEXOL 300 MG/ML  SOLN
100.0000 mL | Freq: Once | INTRAMUSCULAR | Status: AC | PRN
  Administered 2012-10-29: 100 mL via INTRAVENOUS

## 2013-01-20 ENCOUNTER — Emergency Department (HOSPITAL_COMMUNITY)
Admission: EM | Admit: 2013-01-20 | Discharge: 2013-01-20 | Disposition: A | Discharge status: Home / Self Care | Payer: BC Managed Care – PPO | Attending: Emergency Medicine | Admitting: Emergency Medicine

## 2013-01-20 ENCOUNTER — Emergency Department (HOSPITAL_COMMUNITY): Payer: BC Managed Care – PPO

## 2013-01-20 ENCOUNTER — Encounter (HOSPITAL_COMMUNITY): Payer: Self-pay | Admitting: Cardiology

## 2013-01-20 DIAGNOSIS — G8929 Other chronic pain: Secondary | ICD-10-CM | POA: Insufficient documentation

## 2013-01-20 DIAGNOSIS — I10 Essential (primary) hypertension: Secondary | ICD-10-CM | POA: Insufficient documentation

## 2013-01-20 DIAGNOSIS — R109 Unspecified abdominal pain: Secondary | ICD-10-CM

## 2013-01-20 DIAGNOSIS — Z794 Long term (current) use of insulin: Secondary | ICD-10-CM | POA: Insufficient documentation

## 2013-01-20 DIAGNOSIS — K219 Gastro-esophageal reflux disease without esophagitis: Secondary | ICD-10-CM | POA: Insufficient documentation

## 2013-01-20 DIAGNOSIS — R1011 Right upper quadrant pain: Secondary | ICD-10-CM | POA: Insufficient documentation

## 2013-01-20 DIAGNOSIS — M129 Arthropathy, unspecified: Secondary | ICD-10-CM | POA: Insufficient documentation

## 2013-01-20 DIAGNOSIS — R112 Nausea with vomiting, unspecified: Secondary | ICD-10-CM | POA: Insufficient documentation

## 2013-01-20 DIAGNOSIS — E785 Hyperlipidemia, unspecified: Secondary | ICD-10-CM | POA: Insufficient documentation

## 2013-01-20 DIAGNOSIS — M79609 Pain in unspecified limb: Secondary | ICD-10-CM | POA: Insufficient documentation

## 2013-01-20 DIAGNOSIS — E119 Type 2 diabetes mellitus without complications: Secondary | ICD-10-CM | POA: Insufficient documentation

## 2013-01-20 DIAGNOSIS — Z87891 Personal history of nicotine dependence: Secondary | ICD-10-CM | POA: Insufficient documentation

## 2013-01-20 DIAGNOSIS — Z9089 Acquired absence of other organs: Secondary | ICD-10-CM | POA: Insufficient documentation

## 2013-01-20 DIAGNOSIS — Z79899 Other long term (current) drug therapy: Secondary | ICD-10-CM | POA: Insufficient documentation

## 2013-01-20 LAB — URINALYSIS, ROUTINE W REFLEX MICROSCOPIC
Bilirubin Urine: NEGATIVE
Glucose, UA: NEGATIVE mg/dL
Ketones, ur: NEGATIVE mg/dL
Leukocytes, UA: NEGATIVE
Nitrite: NEGATIVE
Protein, ur: 30 mg/dL — AB
pH: 7 (ref 5.0–8.0)

## 2013-01-20 LAB — COMPREHENSIVE METABOLIC PANEL
Albumin: 3.9 g/dL (ref 3.5–5.2)
Alkaline Phosphatase: 89 U/L (ref 39–117)
BUN: 7 mg/dL (ref 6–23)
CO2: 29 mEq/L (ref 19–32)
Chloride: 99 mEq/L (ref 96–112)
Creatinine, Ser: 0.71 mg/dL (ref 0.50–1.35)
GFR calc non Af Amer: >90 mL/min (ref >90)
Glucose, Bld: 197 mg/dL — ABNORMAL HIGH (ref 70–99)
Potassium: 3.9 mEq/L (ref 3.5–5.1)
Total Bilirubin: 0.5 mg/dL (ref 0.3–1.2)

## 2013-01-20 LAB — CBC WITH DIFFERENTIAL/PLATELET
Basophils Relative: 0 % (ref 0–1)
HCT: 39.2 % (ref 39.0–52.0)
Hemoglobin: 13.8 g/dL (ref 13.0–17.0)
Lymphocytes Relative: 35 % (ref 12–46)
Lymphs Abs: 2.8 10*3/uL (ref 0.7–4.0)
MCHC: 35.2 g/dL (ref 30.0–36.0)
Monocytes Absolute: 0.8 10*3/uL (ref 0.1–1.0)
Monocytes Relative: 9 % (ref 3–12)
Neutro Abs: 4.4 10*3/uL (ref 1.7–7.7)
Neutrophils Relative %: 55 % (ref 43–77)
RBC: 4.74 MIL/uL (ref 4.22–5.81)
WBC: 8.2 10*3/uL (ref 4.0–10.5)

## 2013-01-20 LAB — URINE MICROSCOPIC-ADD ON

## 2013-01-20 LAB — LIPASE, BLOOD: Lipase: 15 U/L (ref 11–59)

## 2013-01-20 MED ORDER — ONDANSETRON HCL 4 MG/2ML IJ SOLN
4.0000 mg | Freq: Once | INTRAMUSCULAR | Status: AC
  Administered 2013-01-20: 4 mg via INTRAVENOUS
  Filled 2013-01-20: qty 2

## 2013-01-20 MED ORDER — HYDROMORPHONE HCL PF 1 MG/ML IJ SOLN
0.5000 mg | INTRAMUSCULAR | Status: DC | PRN
  Administered 2013-01-20 (×2): 0.5 mg via INTRAVENOUS
  Filled 2013-01-20 (×2): qty 1

## 2013-01-20 MED ORDER — METOCLOPRAMIDE HCL 10 MG PO TABS: 10.0000 mg | ORAL_TABLET | Freq: Four times a day (QID) | ORAL | Status: DC | PRN

## 2013-01-20 MED ORDER — SODIUM CHLORIDE 0.9 % IV SOLN
1000.0000 mL | INTRAVENOUS | Status: DC
  Administered 2013-01-20: 1000 mL via INTRAVENOUS

## 2013-01-20 MED ORDER — SODIUM CHLORIDE 0.9 % IV SOLN
1000.0000 mL | Freq: Once | INTRAVENOUS | Status: AC
  Administered 2013-01-20: 1000 mL via INTRAVENOUS

## 2013-01-20 NOTE — ED Notes (Signed)
Patient aware urine specimen is needed. Unable to provide one at the moment

## 2013-01-20 NOTE — ED Notes (Signed)
Pt reports generalized abd pain that radiates around to his lower back for the past couple of months. States he has also been having episodes of n/v. States hx of diabetes.

## 2013-01-20 NOTE — ED Provider Notes (Signed)
History   CSN: [id number] Arrival date & time 01/20/13  1659 First MD Initiated Contact with Patient 01/20/13 1836      Chief Complaint  Patient presents with  . Abdominal Pain  . Emesis    HPI Comments: Pt has had it for months.  He saw his doctor and was told it could be neuropathy.   He has not had any blood testing but he did have a colonsocopy for endoscopy a few months ago.  He also had an ultrasound of his abdomen.  He has had his gallbladder removed.  Pt came into the ED because the symptoms were worse.  Patient is a 52 y.o. male presenting with abdominal pain and vomiting. The history is provided by the patient.  Abdominal Pain Pain location:  RUQ Pain quality: burning   Pain severity:  Moderate Onset quality:  Gradual Timing:  Constant Chronicity:  Chronic Associated symptoms: nausea and vomiting   Associated symptoms: no chest pain, no cough, no fever and no shortness of breath   Emesis Associated symptoms: abdominal pain     Past Medical History  Diagnosis Date  . Hypertension   . Diabetes mellitus   . GERD (gastroesophageal reflux disease)     not on Nexium for years  . Arthritis   . Chronic leg pain     since last back surgery  . Hyperlipidemia     Past Surgical History  Procedure Laterality Date  . Steriod injection  01/16/2012    Procedure: MINOR STEROID INJECTION;  Surgeon: Lowella Grip, MD;  Location: [facility];  Service: Orthopedics;  Laterality: Right;  Epidural Steroid Injection  Right Lumbar 3-4 and Right Lumbar 5 - Sacral 1  . Gallbladder surgery    . Back surgery      two, last one 06/2011  . Esophagogastroduodenoscopy  04/2009    Dr. Jearld Shines 8mg IV/Demerol 50mg IV. erosive reflux esophagitis with single patch of salmon colored mucosa at distal esophagus. bx-no definite Barrett's  . Colonoscopy  04/2009    Dr. Hulda Humphrey diverticulum at ascending colon  . Colonoscopy with esophagogastroduodenoscopy (egd)   08/18/2012    Dr. Gala Romney: TCS with few colonic diverticulosis, , EGD with short segment biopsy-proven Barrett's, gastric and bulbar erosions. Negative H.pylori. Low-grade dysplasia of Barrett's.     Family History  Problem Relation Age of Onset  . Colon cancer Other     uncle  . Pancreatic cancer Sister   . Pancreatic cancer Brother   . Diabetes Mother   . Heart disease Mother   . Kidney disease Mother   . Diabetes Other     9 siblings    History  Substance Use Topics  . Smoking status: Former Smoker -- 1.00 packs/day for 20 years    Types: Cigarettes    Quit date: 12/31/1988  . Smokeless tobacco: Current User    Types: Snuff  . Alcohol Use: No      Review of Systems  Constitutional: Negative for fever.  Respiratory: Negative for cough and shortness of breath.   Cardiovascular: Negative for chest pain.  Gastrointestinal: Positive for nausea, vomiting and abdominal pain.  All other systems reviewed and are negative.    Allergies  Review of patient's allergies indicates no known allergies.  Home Medications   Current Outpatient Rx  Name  Route  Sig  Dispense  Refill  . gabapentin (NEURONTIN) 300 MG capsule   Oral   Take 300 mg by mouth 2 (two) times daily.         Marland Kitchen  HYDROcodone-acetaminophen (NORCO) 10-325 MG per tablet   Oral   Take 1 tablet by mouth 2 (two) times daily as needed for pain.         Marland Kitchen insulin glargine (LANTUS) 100 UNIT/ML injection   Subcutaneous   Inject 80 Units into the skin at bedtime.         Marland Kitchen lisinopril (PRINIVIL,ZESTRIL) 20 MG tablet   Oral   Take 20 mg by mouth daily.         Marland Kitchen lubiprostone (AMITIZA) 24 MCG capsule   Oral   Take 24 mcg by mouth 2 (two) times daily with a meal.         . metFORMIN (GLUCOPHAGE) 500 MG tablet   Oral   Take 1,000 mg by mouth 2 (two) times daily with a meal.          . pantoprazole (PROTONIX) 40 MG tablet   Oral   Take 40 mg by mouth at bedtime.         . simvastatin (ZOCOR) 40 MG  tablet   Oral   Take 40 mg by mouth daily.         . metoCLOPramide (REGLAN) 10 MG tablet   Oral   Take 1 tablet (10 mg total) by mouth 4 (four) times daily as needed.   20 tablet   1     BP 148/72  Pulse 55  Temp(Src) 98.2 F (36.8 C) (Oral)  Resp 16  SpO2 99%  Physical Exam  Nursing note and vitals reviewed. Constitutional: He appears well-developed and well-nourished. No distress.  HENT:  Head: Normocephalic and atraumatic.  Right Ear: External ear normal.  Left Ear: External ear normal.  Eyes: Conjunctivae are normal. Right eye exhibits no discharge. Left eye exhibits no discharge. No scleral icterus.  Neck: Neck supple. No tracheal deviation present.  Cardiovascular: Normal rate, regular rhythm and intact distal pulses.   Pulmonary/Chest: Effort normal and breath sounds normal. No stridor. No respiratory distress. He has no wheezes. He has no rales.  Abdominal: Soft. Bowel sounds are normal. He exhibits no distension. There is no tenderness. There is no rebound and no guarding.  Musculoskeletal: He exhibits no edema and no tenderness.  Neurological: He is alert. He has normal strength. No sensory deficit. Cranial nerve deficit:  no gross defecits noted. He exhibits normal muscle tone. He displays no seizure activity. Coordination normal.  Skin: Skin is warm and dry. No rash noted.  Psychiatric: He has a normal mood and affect.    ED Course  Procedures (including critical care time)  Labs Reviewed  COMPREHENSIVE METABOLIC PANEL - Abnormal; Notable for the following:    Glucose, Bld 197 (*)    All other components within normal limits  URINALYSIS, ROUTINE W REFLEX MICROSCOPIC - Abnormal; Notable for the following:    Protein, ur 30 (*)    All other components within normal limits  CBC WITH DIFFERENTIAL  LIPASE, BLOOD  URINE MICROSCOPIC-ADD ON   Dg Abd Acute W/chest  01/20/2013   *RADIOLOGY REPORT*  Clinical Data: Abdominal pain and vomiting.  ACUTE ABDOMEN  SERIES (ABDOMEN 2 VIEW & CHEST 1 VIEW)  Comparison: PA and lateral chest 06/13/2011.  Findings: Single view of the chest demonstrates clear lungs and normal heart size.  No pneumothorax or pleural effusion.  Two views of the abdomen show no free intraperitoneal air.  The bowel gas pattern is normal.  The patient is status post L4-5 fusion.  IMPRESSION: No acute finding chest or  abdomen.   Original Report Authenticated By: Orlean Patten, M.D.     1. Abdominal pain       MDM  The patient's abdominal exam is benign. His laboratory testing and x-ray testing are unremarkable. The symptoms may be related to possibly. He is well hydrated in no distress here in emergent apartment.  he has had a cholecystectomy. At this time and do not feel that further abdominal imaging is necessary. We'll discharge him home with prescription for antibiotics. I recommended followup with a gastroenterologist       Kathalene Frames, MD 01/20/13 2210

## 2013-01-20 NOTE — ED Notes (Signed)
Patient given discharge instructions for abdominal pain. rx for reglan. Advised to follow up with primary care or to return to this department if condition worsens. Patient voiced understanding of all instructions and had no further questions. Patient ambulated to front lobby.

## 2013-02-22 ENCOUNTER — Encounter: Payer: Self-pay | Admitting: Internal Medicine

## 2013-04-11 ENCOUNTER — Emergency Department (HOSPITAL_COMMUNITY): Payer: BC Managed Care – PPO

## 2013-04-11 ENCOUNTER — Encounter (HOSPITAL_COMMUNITY): Payer: Self-pay

## 2013-04-11 ENCOUNTER — Observation Stay (HOSPITAL_COMMUNITY)
Admission: EM | Admit: 2013-04-11 | Discharge: 2013-04-12 | Disposition: A | Discharge status: Home / Self Care | Payer: BC Managed Care – PPO | Attending: Internal Medicine | Admitting: Internal Medicine

## 2013-04-11 DIAGNOSIS — R339 Retention of urine, unspecified: Secondary | ICD-10-CM | POA: Insufficient documentation

## 2013-04-11 DIAGNOSIS — E119 Type 2 diabetes mellitus without complications: Secondary | ICD-10-CM | POA: Diagnosis present

## 2013-04-11 DIAGNOSIS — W19XXXA Unspecified fall, initial encounter: Secondary | ICD-10-CM | POA: Insufficient documentation

## 2013-04-11 DIAGNOSIS — F1110 Opioid abuse, uncomplicated: Secondary | ICD-10-CM | POA: Insufficient documentation

## 2013-04-11 DIAGNOSIS — E860 Dehydration: Secondary | ICD-10-CM | POA: Diagnosis present

## 2013-04-11 DIAGNOSIS — R55 Syncope and collapse: Principal | ICD-10-CM | POA: Diagnosis present

## 2013-04-11 DIAGNOSIS — K3184 Gastroparesis: Secondary | ICD-10-CM

## 2013-04-11 DIAGNOSIS — S0990XA Unspecified injury of head, initial encounter: Secondary | ICD-10-CM | POA: Insufficient documentation

## 2013-04-11 DIAGNOSIS — K5900 Constipation, unspecified: Secondary | ICD-10-CM

## 2013-04-11 DIAGNOSIS — F1190 Opioid use, unspecified, uncomplicated: Secondary | ICD-10-CM | POA: Diagnosis present

## 2013-04-11 HISTORY — DX: Gastroparesis: K31.84

## 2013-04-11 LAB — COMPREHENSIVE METABOLIC PANEL
Albumin: 4.1 g/dL (ref 3.5–5.2)
Alkaline Phosphatase: 92 U/L (ref 39–117)
BUN: 17 mg/dL (ref 6–23)
Calcium: 10.8 mg/dL — ABNORMAL HIGH (ref 8.4–10.5)
Creatinine, Ser: 1.6 mg/dL — ABNORMAL HIGH (ref 0.50–1.35)
GFR calc Af Amer: 55 mL/min — ABNORMAL LOW (ref >90)
Glucose, Bld: 136 mg/dL — ABNORMAL HIGH (ref 70–99)
Potassium: 4 mEq/L (ref 3.5–5.1)
Total Protein: 7.7 g/dL (ref 6.0–8.3)

## 2013-04-11 LAB — CBC
HCT: 43.4 % (ref 39.0–52.0)
Hemoglobin: 15.2 g/dL (ref 13.0–17.0)
MCHC: 35 g/dL (ref 30.0–36.0)
MCV: 86.3 fL (ref 78.0–100.0)
RDW: 13.3 % (ref 11.5–15.5)

## 2013-04-11 LAB — TROPONIN I: Troponin I: <0.3 ng/mL (ref <0.30)

## 2013-04-11 MED ORDER — ASPIRIN 81 MG PO CHEW
324.0000 mg | CHEWABLE_TABLET | Freq: Once | ORAL | Status: AC
  Administered 2013-04-11: 324 mg via ORAL
  Filled 2013-04-11: qty 4

## 2013-04-11 NOTE — ED Notes (Signed)
Patient attempting to void. Unable to at this time.

## 2013-04-11 NOTE — H&P (Addendum)
PCP:   Leonides Grills, MD   Chief Complaint:  Passed out  HPI: 52 year old male with a history of diabetes mellitus who was brought to the hospital after patient had syncopal episode at home. As per patient's wife this morning patient did not eat his breakfast and then mowed the yard and after that he started feeling very hungry, he ate lunch around 3 PM and also took 14 units of HumaLog. Patient did not check his blood sugar before lunch.  Since then patient was feeling sick, had headache, visual changes also had some nausea vomiting and finally he passed out around 6 PM. Patient also had some garbled speech, and ataxia. All the above symptoms have resolved, the only symptom patient complains is weakness. He denies chest pain shortness of breath, complains of constipation, positive hesitancy of urination.  Allergies:  No Known Allergies    Past Medical History  Diagnosis Date  . Hypertension   . Diabetes mellitus   . GERD (gastroesophageal reflux disease)     not on Nexium for years  . Arthritis   . Chronic leg pain     since last back surgery  . Hyperlipidemia   . Gastroparesis     Past Surgical History  Procedure Laterality Date  . Steriod injection  01/16/2012    Procedure: MINOR STEROID INJECTION;  Surgeon: Lowella Grip, MD;  Location: [place];  Service: Orthopedics;  Laterality: Right;  Epidural Steroid Injection  Right Lumbar 3-4 and Right Lumbar 5 - Sacral 1  . Gallbladder surgery    . Back surgery      two, last one 06/2011  . Esophagogastroduodenoscopy  04/2009    Dr. Jearld Shines 8mg IV/Demerol 50mg IV. erosive reflux esophagitis with single patch of salmon colored mucosa at distal esophagus. bx-no definite Barrett's  . Colonoscopy  04/2009    Dr. Hulda Humphrey diverticulum at ascending colon  . Colonoscopy with esophagogastroduodenoscopy (egd)  08/18/2012    Dr. Gala Romney: TCS with few colonic diverticulosis, , EGD with short segment  biopsy-proven Barrett's, gastric and bulbar erosions. Negative H.pylori. Low-grade dysplasia of Barrett's.     Prior to Admission medications   Medication Sig Start Date End Date Taking? Authorizing Provider  gabapentin (NEURONTIN) 300 MG capsule Take 600 mg by mouth 2 (two) times daily.    Yes Historical Provider, MD  insulin glargine (LANTUS) 100 UNIT/ML injection Inject 49-50 Units into the skin at bedtime.    Yes Historical Provider, MD  insulin lispro (HUMALOG) 100 UNIT/ML injection Inject 14 Units into the skin 3 (three) times daily before meals.   Yes Historical Provider, MD  lisinopril (PRINIVIL,ZESTRIL) 20 MG tablet Take 20 mg by mouth daily.   Yes Historical Provider, MD  lubiprostone (AMITIZA) 24 MCG capsule Take 24 mcg by mouth 2 (two) times daily with a meal. 10/26/12  Yes Orvil Feil, NP  oxyCODONE (ROXICODONE) 15 MG immediate release tablet Take 15 mg by mouth 3 (three) times daily as needed for pain.   Yes Historical Provider, MD  simvastatin (ZOCOR) 40 MG tablet Take 40 mg by mouth daily. 07/20/12  Yes Historical Provider, MD    Social History:  reports that he quit smoking about 24 years ago. His smoking use included Cigarettes. He has a 20 pack-year smoking history. His smokeless tobacco use includes Snuff. He reports that he does not drink alcohol or use illicit drugs.  Family History  Problem Relation Age of Onset  . Colon cancer Other     uncle  .  Pancreatic cancer Sister   . Pancreatic cancer Brother   . Diabetes Mother   . Heart disease Mother   . Kidney disease Mother   . Diabetes Other     9 siblings     All the positives are listed in BOLD  Review of Systems:  HEENT: Headache, blurred vision, runny nose, sore throat Neck: Hypothyroidism, hyperthyroidism,,lymphadenopathy Chest : Shortness of breath, history of COPD, Asthma Heart : Chest pain, history of coronary arterey disease GI:  Nausea, vomiting, diarrhea, constipation, GERD, gastroparesis GU:  Dysuria, urgency, frequency of urination, hematuria, hesitancy Neuro: Stroke, seizures, syncope Psych: Depression, anxiety, hallucinations   Physical Exam: Blood pressure 148/72, pulse 87, temperature 97.5 F (36.4 C), temperature source Oral, resp. rate 20, height 5\' 10"  (1.778 m), weight 88.905 kg (196 lb), SpO2 98.00%. Constitutional:   Patient is a well-developed and well-nourished male* in no acute distress and cooperative with exam. Head: Normocephalic and atraumatic Mouth: Mucus membranes moist Eyes: PERRL, EOMI, conjunctivae normal Neck: Supple, No Thyromegaly Cardiovascular: RRR, S1 normal, S2 normal Pulmonary/Chest: CTAB, no wheezes, rales, or rhonchi Abdominal: Soft. Non-tender, non-distended, bowel sounds are normal, no masses, organomegaly, or guarding present.  Neurological: A&O x3, Strenght is normal and symmetric bilaterally, cranial nerve II-XII are grossly intact, no focal motor deficit, sensory intact to light touch bilaterally.  Extremities : No Cyanosis, Clubbing or Edema   Labs on Admission:  Results for orders placed during the hospital encounter of 04/11/13 (from the past 48 hour(s))  GLUCOSE, CAPILLARY     Status: Abnormal   Collection Time    04/11/13  9:13 PM      Result Value Range   Glucose-Capillary 126 (*) 70 - 99 mg/dL  COMPREHENSIVE METABOLIC PANEL     Status: Abnormal   Collection Time    04/11/13  9:20 PM      Result Value Range   Sodium 138  135 - 145 mEq/L   Potassium 4.0  3.5 - 5.1 mEq/L   Chloride 97  96 - 112 mEq/L   CO2 31  19 - 32 mEq/L   Glucose, Bld 136 (*) 70 - 99 mg/dL   BUN 17  6 - 23 mg/dL   Creatinine, Ser 1.60 (*) 0.50 - 1.35 mg/dL   Calcium 10.8 (*) 8.4 - 10.5 mg/dL   Total Protein 7.7  6.0 - 8.3 g/dL   Albumin 4.1  3.5 - 5.2 g/dL   AST 24  0 - 37 U/L   ALT 22  0 - 53 U/L   Alkaline Phosphatase 92  39 - 117 U/L   Total Bilirubin 0.6  0.3 - 1.2 mg/dL   GFR calc non Af Amer 47 (*) >90 mL/min   GFR calc Af Amer 55 (*) >90  mL/min   Comment:            The eGFR has been calculated     using the CKD EPI equation.     This calculation has not been     validated in all clinical     situations.     eGFR's persistently     <90 mL/min signify     possible Chronic Kidney Disease.  TROPONIN I     Status: None   Collection Time    04/11/13  9:20 PM      Result Value Range   Troponin I <0.30  <0.30 ng/mL   Comment:            Due to the release  kinetics of cTnI,     a negative result within the first hours     of the onset of symptoms does not rule out     myocardial infarction with certainty.     If myocardial infarction is still suspected,     repeat the test at appropriate intervals.  CBC     Status: None   Collection Time    04/11/13  9:20 PM      Result Value Range   WBC 8.8  4.0 - 10.5 K/uL   RBC 5.03  4.22 - 5.81 MIL/uL   Hemoglobin 15.2  13.0 - 17.0 g/dL   HCT 43.4  39.0 - 52.0 %   MCV 86.3  78.0 - 100.0 fL   MCH 30.2  26.0 - 34.0 pg   MCHC 35.0  30.0 - 36.0 g/dL   RDW 13.3  11.5 - 15.5 %   Platelets 242  150 - 400 K/uL    Radiological Exams on Admission: Ct Head Wo Contrast  04/11/2013   *RADIOLOGY REPORT*  Clinical Data: Syncope.  Fall with a blow to the head.  CT HEAD WITHOUT CONTRAST  Technique:  Contiguous axial images were obtained from the base of the skull through the vertex without contrast.  Comparison: None.  Findings: No evidence of acute intracranial abnormality including infarction, hemorrhage, mass lesion, mass effect, midline shift or abnormal extra-axial fluid collection is identified.  No hydrocephalus or pneumocephalus.  The calvarium is intact.  IMPRESSION: Negative examination.   Original Report Authenticated By: Orlean Patten, M.D.    Assessment/Plan Active Problems:   Constipation   Chronic, continuous use of opioids   Syncope   Diabetes mellitus  Acute kidney injury  Syncope versus TIA Patient had a syncopal episode around 6 PM, also had neurological symptoms of  ataxia, slurred speech all of which have resolved at this time. Patient's blood glucose was elevated around 6 PM as per wife it was 303. CT scan head in the ED shows no acute stroke. Will admit the patient to telemetry and obtain 2-D echocardiogram. We'll obtain MRI of brain in the morning. We'll check neurochecks every 4 hours. We'll obtain 3 sets of cardiac enzymes. Also check carotid Dopplers.  All the above symptoms also could have been due to hypoglycemia since patient did not check the blood sugar at 3 PM before he took Humalog 14 units. And also the fact that patient mowed the yard without eating anything around 3 PM.   Diabetes mellitus Will continue Lantus, and start sliding scale insulin We'll obtain  nutrition consult in a.m. for diabetic teaching.  Constipation Secondary to chronic use of opiates We'll give Dulcolax suppository Colace 100 mg by mouth twice a day  Nausea vomiting Patient has a history of gastroparesis Will continue Zofran when necessary Also start Reglan when necessary Also start Protonix 40 mg IV daily   Acute kidney injury Patient's creatinine is elevated We'll start IV normal saline and follow BMP in the morning  DVT prophylaxis  Lovenox  Code status: Full code   Family discussion: Discussed with wife at bedside   Time Spent on Admission: 57 min  [facility] Pager: [phone] 04/11/2013, 11:28 PM  If 7PM-7AM, please contact night-coverage  [email or web address]  Password [email or web address]

## 2013-04-11 NOTE — ED Provider Notes (Signed)
CSN: [id number]     Arrival date & time 04/11/13  2049 History  This chart was scribed for Orlie Dakin, MD by Jenne Campus, ED Scribe. This patient was seen in room APA02/APA02 and the patient's care was started at 9:48 PM.   First MD Initiated Contact with Patient 04/11/13 2121     Chief Complaint  Patient presents with  . Weakness    Patient is a 52 y.o. male presenting with fall. The history is provided by the patient. No language interpreter was used.  Fall This is a new problem. The current episode started 3 to 5 hours ago. Episode frequency: once. The problem has been resolved. Nothing aggravates the symptoms. Nothing relieves the symptoms. He has tried nothing for the symptoms.    HPI Comments: William Schroeder is a 52 y.o. male who presents to the Emergency Department complaining of a fall with head trauma that occurred around 5 PM tonight. Pt states that he had been working on a house when his vision went white around 1 PM. He then became dizzy and started stumbling around 2 PM. Pt has a h/o DM and thought that eating would improve his symptoms. CBG was 126 in the ED. patient's wife reported that his blood sugar was checked while he was experiencing weakness at home which. [place] was approximately 300 during episode of weakness. However, around 3 PM he developed right-sided facial pain with tingling. Wife states that she got the pt to take a nap and upon waking around 6 PM he started vomiting. Wife reports 2 incidents of emesis.  After the vomiting, the pt then became incoherent and fell around 7 PM. Pt reports that he thinks his head hit the side of a bar and then his right face on a piece of concrete in his garage although he doesn't remember the fall. He c/o generalized weakness currently. He states that currently vision has improved but is not back to baseline and wife states that the pt is back to his normal mentation baseline. He admits to having prior episodes of vision  disturbances with no known cause. He denies following up with PCP for the symptoms. He denies having symptoms upon waking. He denies numbness and extremity weakness. He admits to chewing tobacco but denies smoking and alcohol use. TD is UTD.  Dr. Orson Ape is PCP.  Past Medical History  Diagnosis Date  . Hypertension   . Diabetes mellitus   . GERD (gastroesophageal reflux disease)     not on Nexium for years  . Arthritis   . Chronic leg pain     since last back surgery  . Hyperlipidemia   . Gastroparesis    Past Surgical History  Procedure Laterality Date  . Steriod injection  01/16/2012    Procedure: MINOR STEROID INJECTION;  Surgeon: Lowella Grip, MD;  Location: [place];  Service: Orthopedics;  Laterality: Right;  Epidural Steroid Injection  Right Lumbar 3-4 and Right Lumbar 5 - Sacral 1  . Gallbladder surgery    . Back surgery      two, last one 06/2011  . Esophagogastroduodenoscopy  04/2009    Dr. Jearld Shines 8mg IV/Demerol 50mg IV. erosive reflux esophagitis with single patch of salmon colored mucosa at distal esophagus. bx-no definite Barrett's  . Colonoscopy  04/2009    Dr. Hulda Humphrey diverticulum at ascending colon  . Colonoscopy with esophagogastroduodenoscopy (egd)  08/18/2012    Dr. Gala Romney: TCS with few colonic diverticulosis, , EGD with short segment biopsy-proven Barrett's, gastric  and bulbar erosions. Negative H.pylori. Low-grade dysplasia of Barrett's.    Family History  Problem Relation Age of Onset  . Colon cancer Other     uncle  . Pancreatic cancer Sister   . Pancreatic cancer Brother   . Diabetes Mother   . Heart disease Mother   . Kidney disease Mother   . Diabetes Other     9 siblings   History  Substance Use Topics  . Smoking status: Former Smoker -- 1.00 packs/day for 20 years    Types: Cigarettes    Quit date: 12/31/1988  . Smokeless tobacco: Current User    Types: Snuff  . Alcohol Use: No    Review of Systems   HENT: Negative.   Eyes: Positive for visual disturbance.  Respiratory: Negative.   Cardiovascular: Negative.   Gastrointestinal: Positive for vomiting.  Musculoskeletal: Negative.   Skin: Positive for wound.       Abrasion at left cheek  Neurological: Positive for dizziness and weakness.  Hematological: Negative.   Psychiatric/Behavioral: Negative.     Allergies  Review of patient's allergies indicates no known allergies.  Home Medications   Current Outpatient Rx  Name  Route  Sig  Dispense  Refill  . gabapentin (NEURONTIN) 300 MG capsule   Oral   Take 300 mg by mouth 2 (two) times daily.         Marland Kitchen HYDROcodone-acetaminophen (NORCO) 10-325 MG per tablet   Oral   Take 1 tablet by mouth 2 (two) times daily as needed for pain.         Marland Kitchen insulin glargine (LANTUS) 100 UNIT/ML injection   Subcutaneous   Inject 80 Units into the skin at bedtime.         Marland Kitchen lisinopril (PRINIVIL,ZESTRIL) 20 MG tablet   Oral   Take 20 mg by mouth daily.         Marland Kitchen lubiprostone (AMITIZA) 24 MCG capsule   Oral   Take 24 mcg by mouth 2 (two) times daily with a meal.         . metFORMIN (GLUCOPHAGE) 500 MG tablet   Oral   Take 1,000 mg by mouth 2 (two) times daily with a meal.          . metoCLOPramide (REGLAN) 10 MG tablet   Oral   Take 1 tablet (10 mg total) by mouth 4 (four) times daily as needed.   20 tablet   1   . pantoprazole (PROTONIX) 40 MG tablet   Oral   Take 40 mg by mouth at bedtime.         . simvastatin (ZOCOR) 40 MG tablet   Oral   Take 40 mg by mouth daily.          Triage Vitals: BP 115/62  Pulse 84  Temp(Src) 97.5 F (36.4 C) (Oral)  Resp 16  Ht 5\' 10"  (1.778 m)  Wt 196 lb (88.905 kg)  BMI 28.12 kg/m2  SpO2 98%  Physical Exam  Nursing note and vitals reviewed. Constitutional: He is oriented to person, place, and time. He appears well-developed and well-nourished.  HENT:  Head: Normocephalic.  abraision to the left cheek  Eyes: Conjunctivae  are normal. Pupils are equal, round, and reactive to light.  Neck: Neck supple. No tracheal deviation present. No thyromegaly present.  Cardiovascular: Normal rate and regular rhythm.   No murmur heard. Pulmonary/Chest: Effort normal and breath sounds normal.  Abdominal: Soft. Bowel sounds are normal. He exhibits no distension. There  is no tenderness.  Musculoskeletal: Normal range of motion. He exhibits no edema and no tenderness.  Neurological: He is alert and oriented to person, place, and time. He has normal reflexes. No cranial nerve deficit. He exhibits normal muscle tone. Coordination normal.  normal finger to nose bilaterally, normal heel to shin bilaterally  Skin: Skin is warm and dry. No rash noted.  Psychiatric: He has a normal mood and affect.    ED Course   Procedures (including critical care time)  DIAGNOSTIC STUDIES: Oxygen Saturation is 98% on room air, normal by my interpretation.    COORDINATION OF CARE: 9:50 PM-Discussed treatment plan which includes CT of head, CBC panel, CMP and UA with pt at bedside and pt agreed to plan.   Labs Reviewed  GLUCOSE, CAPILLARY - Abnormal; Notable for the following:    Glucose-Capillary 126 (*)    All other components within normal limits  COMPREHENSIVE METABOLIC PANEL  TROPONIN I  URINE RAPID DRUG SCREEN (HOSP PERFORMED)  URINALYSIS, ROUTINE W REFLEX MICROSCOPIC  CBC   No results found. No diagnosis found.  Date: 04/11/2013  Rate: 70  Rhythm: normal sinus rhythm  QRS Axis: normal  Intervals: normal  ST/T Wave abnormalities: normal  Conduction Disutrbances:none  Narrative Interpretation:   Old EKG Reviewed: unchanged Unchanged from October of 2012 interpreted by me Results for orders placed during the hospital encounter of 04/11/13  GLUCOSE, CAPILLARY      Result Value Range   Glucose-Capillary 126 (*) 70 - 99 mg/dL  COMPREHENSIVE METABOLIC PANEL      Result Value Range   Sodium 138  135 - 145 mEq/L   Potassium  4.0  3.5 - 5.1 mEq/L   Chloride 97  96 - 112 mEq/L   CO2 31  19 - 32 mEq/L   Glucose, Bld 136 (*) 70 - 99 mg/dL   BUN 17  6 - 23 mg/dL   Creatinine, Ser 1.60 (*) 0.50 - 1.35 mg/dL   Calcium 10.8 (*) 8.4 - 10.5 mg/dL   Total Protein 7.7  6.0 - 8.3 g/dL   Albumin 4.1  3.5 - 5.2 g/dL   AST 24  0 - 37 U/L   ALT 22  0 - 53 U/L   Alkaline Phosphatase 92  39 - 117 U/L   Total Bilirubin 0.6  0.3 - 1.2 mg/dL   GFR calc non Af Amer 47 (*) >90 mL/min   GFR calc Af Amer 55 (*) >90 mL/min  TROPONIN I      Result Value Range   Troponin I <0.30  <0.30 ng/mL  CBC      Result Value Range   WBC 8.8  4.0 - 10.5 K/uL   RBC 5.03  4.22 - 5.81 MIL/uL   Hemoglobin 15.2  13.0 - 17.0 g/dL   HCT 43.4  39.0 - 52.0 %   MCV 86.3  78.0 - 100.0 fL   MCH 30.2  26.0 - 34.0 pg   MCHC 35.0  30.0 - 36.0 g/dL   RDW 13.3  11.5 - 15.5 %   Platelets 242  150 - 400 K/uL   Ct Head Wo Contrast  04/11/2013   *RADIOLOGY REPORT*  Clinical Data: Syncope.  Fall with a blow to the head.  CT HEAD WITHOUT CONTRAST  Technique:  Contiguous axial images were obtained from the base of the skull through the vertex without contrast.  Comparison: None.  Findings: No evidence of acute intracranial abnormality including infarction, hemorrhage, mass lesion, mass effect,  midline shift or abnormal extra-axial fluid collection is identified.  No hydrocephalus or pneumocephalus.  The calvarium is intact.  IMPRESSION: Negative examination.   Original Report Authenticated By: Orlean Patten, M.D.   11pm pt alert, approoriate, gcs 15 MDM  In light of visual changes, syncope or near syncope, unsteady gait I feel the patient should be observed for 24 hours, MRI brain, neurochecks, aspirin possible TIA Spoke with Dr.Lama Plan telemetry, Diagnosis #1 near syncope #2  Weakness #3 renal insufficiency   I personally performed the services described in this documentation, which was scribed in my presence. The recorded information has been reviewed  and considered.    Orlie Dakin, MD 04/11/13 [phone]

## 2013-04-11 NOTE — ED Notes (Signed)
Dizziness and "white" vision onset 1pm, then vomiting,   left sided facial tingling episode @5pm . Pt got up and went into garage and passed out, hitting left side of face on concrete

## 2013-04-11 NOTE — ED Notes (Signed)
Per family member, "he seems disoriented. It started right before he passed out."

## 2013-04-12 ENCOUNTER — Encounter (HOSPITAL_COMMUNITY): Payer: Self-pay | Admitting: *Deleted

## 2013-04-12 ENCOUNTER — Observation Stay (HOSPITAL_COMMUNITY): Payer: BC Managed Care – PPO

## 2013-04-12 DIAGNOSIS — K3184 Gastroparesis: Secondary | ICD-10-CM | POA: Diagnosis present

## 2013-04-12 DIAGNOSIS — I519 Heart disease, unspecified: Secondary | ICD-10-CM

## 2013-04-12 DIAGNOSIS — E860 Dehydration: Secondary | ICD-10-CM | POA: Diagnosis present

## 2013-04-12 LAB — CBC
HCT: 39.1 % (ref 39.0–52.0)
Hemoglobin: 13.5 g/dL (ref 13.0–17.0)
MCH: 29.7 pg (ref 26.0–34.0)
MCV: 85.9 fL (ref 78.0–100.0)
RBC: 4.55 MIL/uL (ref 4.22–5.81)
WBC: 7.4 10*3/uL (ref 4.0–10.5)

## 2013-04-12 LAB — COMPREHENSIVE METABOLIC PANEL
ALT: 17 U/L (ref 0–53)
CO2: 30 mEq/L (ref 19–32)
Calcium: 9.6 mg/dL (ref 8.4–10.5)
Chloride: 102 mEq/L (ref 96–112)
Creatinine, Ser: 1.04 mg/dL (ref 0.50–1.35)
GFR calc Af Amer: >90 mL/min (ref >90)
GFR calc non Af Amer: 80 mL/min — ABNORMAL LOW (ref >90)
Glucose, Bld: 177 mg/dL — ABNORMAL HIGH (ref 70–99)
Total Bilirubin: 0.7 mg/dL (ref 0.3–1.2)

## 2013-04-12 LAB — URINALYSIS, ROUTINE W REFLEX MICROSCOPIC
Glucose, UA: 100 mg/dL — AB
Hgb urine dipstick: NEGATIVE
Ketones, ur: NEGATIVE mg/dL
Leukocytes, UA: NEGATIVE
pH: 5.5 (ref 5.0–8.0)

## 2013-04-12 LAB — URINE MICROSCOPIC-ADD ON

## 2013-04-12 LAB — GLUCOSE, CAPILLARY: Glucose-Capillary: 177 mg/dL — ABNORMAL HIGH (ref 70–99)

## 2013-04-12 LAB — LIPID PANEL
Cholesterol: 155 mg/dL (ref 0–200)
HDL: 37 mg/dL — ABNORMAL LOW (ref >39)
Total CHOL/HDL Ratio: 4.2 RATIO
VLDL: 21 mg/dL (ref 0–40)

## 2013-04-12 LAB — RAPID URINE DRUG SCREEN, HOSP PERFORMED
Amphetamines: NOT DETECTED
Benzodiazepines: NOT DETECTED
Cocaine: NOT DETECTED
Opiates: NOT DETECTED

## 2013-04-12 MED ORDER — LISINOPRIL 10 MG PO TABS
20.0000 mg | ORAL_TABLET | Freq: Every day | ORAL | Status: DC
  Administered 2013-04-12: 20 mg via ORAL
  Filled 2013-04-12: qty 2

## 2013-04-12 MED ORDER — ONDANSETRON HCL 4 MG/2ML IJ SOLN: 4.0000 mg | Freq: Four times a day (QID) | INTRAMUSCULAR | Status: DC | PRN

## 2013-04-12 MED ORDER — LUBIPROSTONE 24 MCG PO CAPS
24.0000 ug | ORAL_CAPSULE | Freq: Two times a day (BID) | ORAL | Status: DC
  Administered 2013-04-12: 24 ug via ORAL
  Filled 2013-04-12 (×3): qty 1

## 2013-04-12 MED ORDER — ENOXAPARIN SODIUM 40 MG/0.4ML ~~LOC~~ SOLN
40.0000 mg | SUBCUTANEOUS | Status: DC
  Administered 2013-04-12: 40 mg via SUBCUTANEOUS
  Filled 2013-04-12: qty 0.4

## 2013-04-12 MED ORDER — INSULIN ASPART 100 UNIT/ML ~~LOC~~ SOLN: 0.0000 [IU] | Freq: Every day | SUBCUTANEOUS | Status: DC

## 2013-04-12 MED ORDER — SODIUM CHLORIDE 0.9 % IV SOLN
INTRAVENOUS | Status: DC
  Administered 2013-04-12: 06:00:00 via INTRAVENOUS

## 2013-04-12 MED ORDER — INSULIN GLARGINE 100 UNIT/ML ~~LOC~~ SOLN
20.0000 [IU] | Freq: Every day | SUBCUTANEOUS | Status: DC
  Filled 2013-04-12: qty 0.2

## 2013-04-12 MED ORDER — ASPIRIN EC 325 MG PO TBEC
325.0000 mg | DELAYED_RELEASE_TABLET | Freq: Every day | ORAL | Status: DC
  Administered 2013-04-12: 325 mg via ORAL
  Filled 2013-04-12: qty 1

## 2013-04-12 MED ORDER — SODIUM CHLORIDE 0.9 % IJ SOLN
3.0000 mL | Freq: Two times a day (BID) | INTRAMUSCULAR | Status: DC
  Administered 2013-04-12: 3 mL via INTRAVENOUS

## 2013-04-12 MED ORDER — OXYCODONE HCL 5 MG PO TABS: 15.0000 mg | ORAL_TABLET | Freq: Three times a day (TID) | ORAL | Status: DC | PRN

## 2013-04-12 MED ORDER — SODIUM CHLORIDE 0.9 % IV BOLUS (SEPSIS)
500.0000 mL | Freq: Once | INTRAVENOUS | Status: AC
  Administered 2013-04-12: 500 mL via INTRAVENOUS

## 2013-04-12 MED ORDER — ONDANSETRON HCL 4 MG PO TABS: 4.0000 mg | ORAL_TABLET | Freq: Four times a day (QID) | ORAL | Status: DC | PRN

## 2013-04-12 MED ORDER — BISACODYL 10 MG RE SUPP: 10.0000 mg | Freq: Every day | RECTAL | Status: DC | PRN

## 2013-04-12 MED ORDER — DOCUSATE SODIUM 100 MG PO CAPS
100.0000 mg | ORAL_CAPSULE | Freq: Two times a day (BID) | ORAL | Status: DC
  Administered 2013-04-12: 100 mg via ORAL
  Filled 2013-04-12: qty 1

## 2013-04-12 MED ORDER — SIMVASTATIN 20 MG PO TABS: 40.0000 mg | ORAL_TABLET | Freq: Every day | ORAL | Status: DC

## 2013-04-12 MED ORDER — INSULIN GLARGINE 100 UNIT/ML ~~LOC~~ SOLN
49.0000 [IU] | Freq: Every day | SUBCUTANEOUS | Status: DC
  Filled 2013-04-12: qty 0.5

## 2013-04-12 MED ORDER — INSULIN ASPART 100 UNIT/ML ~~LOC~~ SOLN
0.0000 [IU] | Freq: Three times a day (TID) | SUBCUTANEOUS | Status: DC
  Administered 2013-04-12 (×2): 3 [IU] via SUBCUTANEOUS

## 2013-04-12 MED ORDER — SODIUM CHLORIDE 0.9 % IV SOLN
250.0000 mL | INTRAVENOUS | Status: DC | PRN
Start: 2013-04-12 — End: 2013-04-12

## 2013-04-12 MED ORDER — GABAPENTIN 300 MG PO CAPS
600.0000 mg | ORAL_CAPSULE | Freq: Two times a day (BID) | ORAL | Status: DC
  Administered 2013-04-12 (×2): 600 mg via ORAL
  Filled 2013-04-12 (×2): qty 2

## 2013-04-12 MED ORDER — SODIUM CHLORIDE 0.9 % IJ SOLN: 3.0000 mL | INTRAMUSCULAR | Status: DC | PRN

## 2013-04-12 MED ORDER — PANTOPRAZOLE SODIUM 40 MG IV SOLR
40.0000 mg | Freq: Every day | INTRAVENOUS | Status: DC
  Administered 2013-04-12: 40 mg via INTRAVENOUS
  Filled 2013-04-12: qty 40

## 2013-04-12 MED ORDER — SODIUM CHLORIDE 0.9 % IV SOLN: 250.0000 mL | INTRAVENOUS | Status: DC | PRN

## 2013-04-12 MED ORDER — INSULIN GLARGINE 100 UNIT/ML ~~LOC~~ SOLN: 50.0000 [IU] | Freq: Every day | SUBCUTANEOUS | Status: DC

## 2013-04-12 MED ORDER — METOCLOPRAMIDE HCL 5 MG/ML IJ SOLN: 10.0000 mg | Freq: Three times a day (TID) | INTRAMUSCULAR | Status: DC | PRN

## 2013-04-12 NOTE — Progress Notes (Signed)
Utilization Review Complete  

## 2013-04-12 NOTE — Progress Notes (Signed)
*  PRELIMINARY RESULTS* Echocardiogram 2D Echocardiogram has been performed.  Tera Partridge 04/12/2013, 11:36 AM

## 2013-04-12 NOTE — Progress Notes (Signed)
  RD consulted for nutrition education regarding diabetes.   Lab Results  Component Value Date   HGBA1C 11.0* 06/18/2011    RD provided "Carbohydrate Counting for People with Diabetes" handout from the Academy of Nutrition and Dietetics. Discussed different food groups and their effects on blood sugar, emphasizing carbohydrate-containing foods. Provided list of carbohydrates and recommended serving sizes of common foods.  Discussed importance of controlled and consistent carbohydrate intake throughout the day. Provided examples of ways to balance meals/snacks and encouraged intake of high-fiber, whole grain complex carbohydrates. Teach back method used.  Expect fair compliance.  Body mass index is 29.01 kg/(m^2). Pt meets criteria for overweight based on current BMI.  Current diet order is Heart Healthy, patient is consuming approximately 75-100% of meals at this time. Labs and medications reviewed. No further nutrition interventions warranted at this time. RD contact information provided. If additional nutrition issues arise, please re-consult RD.  Colman Cater MS,RD,LDN,CSG Office: [phone] Pager: [phone]

## 2013-04-12 NOTE — Discharge Summary (Signed)
Physician Discharge Summary  ARGLE MICA [id number] DOB: 15-Jul-1961 DOA: 04/11/2013  PCP: Leonides Grills, MD  Admit date: 04/11/2013 Discharge date: 04/12/2013  Time spent: 45 minutes  Recommendations for Outpatient Follow-up:  1. Follow up with PCP 1 week. Follow results of echo, HgA1c and TSH.  Discharge Diagnoses:  Active Problems:   Constipation   Chronic, continuous use of opioids   Syncope   Diabetes mellitus   Urinary retention   Discharge Condition: stable  Diet recommendation: carb modified  Filed Weights   04/11/13 2102 04/12/13 0025  Weight: 196 lb (88.905 kg) 202 lb 3.2 oz (91.717 kg)    History of present illness:  52 year old male with a history of diabetes mellitus who was brought to the hospital on 04/11/13 after patient had syncopal episode at home. As per patient's wife patient did not eat his breakfast and then mowed the yard and after that he started feeling very hungry, he ate lunch around 3 PM and also took 14 units of HumaLog. Patient did not check his blood sugar before lunch. Since then patient was feeling sick, had headache, visual changes also had some nausea vomiting and finally he passed out around 6 PM. Patient also had some garbled speech, and ataxia. All the above symptoms resolved at admission exam, the only symptom patient complained of was weakness. He denied chest pain shortness of breath, complained of constipation and hesitancy of urination.      Hospital Course:  Syncope related to dehydration Patient had a syncopal episode around 6 PM 04/11/13 with neurological symptoms of ataxia, slurred speech all of which resolved quickly. Patient's blood glucose was elevated around 6 PM. CT scan head in the ED shows no acute stroke. MRI negative and carotid doppler yield <50% diameter stenosis bilaterally. No events on tele. Cardiac enzymes neg x3. EKG with NSR. Orthostatic VS negative. TSH pending at discharge.  All the above symptoms also could have  been due to hypoglycemia (since patient did not check the blood sugar at 3 PM before he took Humalog 14 units) and or dehydration from mowing the yard without eating anything around 3 PM. All symptoms resolved at discharge.   Diabetes mellitus   Admitted with home Lantus, and start sliding scale insulin. CBG range 126-177.  Seen by diabetic coordinator. HgA1c pending at discharge. Recommend OP follow up   Constipation  Secondary to chronic use of opiates  Given Dulcolax suppository with fair results. Started colace 100 mg by mouth twice a day   Nausea vomiting  Patient has a history of gastroparesis . Resolved at discharge. Continue PPI   Acute kidney injury  Likely related to dehydration. Resolved with IV fluids. Continue IV normal saline.   Urinary hesitancy: low urine output related to dehydration.  Improved after hydration and patient passed urine  DVT prophylaxis  Lovenox    Procedures:  none  Consultations:  none  Discharge Exam: Filed Vitals:   04/12/13 0536  BP: 102/69  Pulse: 77  Temp:   Resp:     General: well nourished NAD Cardiovascular: RRR No MGR No LE edema Respiratory: normal effort BS clear bilaterally no wheeze no rhonchi Neuro: cranial nerve II-XII intact. Speech clear. Facial symmetry.  Discharge Instructions     Medication List         gabapentin 300 MG capsule  Commonly known as:  NEURONTIN  Take 600 mg by mouth 2 (two) times daily.     insulin glargine 100 UNIT/ML injection  Commonly known as:  LANTUS  Inject 49-50 Units into the skin at bedtime.     insulin lispro 100 UNIT/ML injection  Commonly known as:  HUMALOG  Inject 14 Units into the skin 3 (three) times daily before meals.     lisinopril 20 MG tablet  Commonly known as:  PRINIVIL,ZESTRIL  Take 20 mg by mouth daily.     lubiprostone 24 MCG capsule  Commonly known as:  AMITIZA  Take 24 mcg by mouth 2 (two) times daily with a meal.     oxyCODONE 15 MG immediate release  tablet  Commonly known as:  ROXICODONE  Take 15 mg by mouth 3 (three) times daily as needed for pain.     simvastatin 40 MG tablet  Commonly known as:  ZOCOR  Take 40 mg by mouth daily.       No Known Allergies     Follow-up Information   Follow up with Leonides Grills, MD. Schedule an appointment as soon as possible for a visit in 1 week. (evaluation of symptoms)    Contact information:   [id number] [place] [address] [phone]        The results of significant diagnostics from this hospitalization (including imaging, microbiology, ancillary and laboratory) are listed below for reference.    Significant Diagnostic Studies: Ct Head Wo Contrast  04/11/2013   *RADIOLOGY REPORT*  Clinical Data: Syncope.  Fall with a blow to the head.  CT HEAD WITHOUT CONTRAST  Technique:  Contiguous axial images were obtained from the base of the skull through the vertex without contrast.  Comparison: None.  Findings: No evidence of acute intracranial abnormality including infarction, hemorrhage, mass lesion, mass effect, midline shift or abnormal extra-axial fluid collection is identified.  No hydrocephalus or pneumocephalus.  The calvarium is intact.  IMPRESSION: Negative examination.   Original Report Authenticated By: Orlean Patten, M.D.   Mr Brain Wo Contrast  04/12/2013   *RADIOLOGY REPORT*  Clinical Data: Syncope  MRI HEAD WITHOUT CONTRAST  Technique:  Multiplanar, multiecho pulse sequences of the brain and surrounding structures were obtained according to standard protocol without intravenous contrast.  Comparison: CT 04/11/2013  Findings: Ventricle size is normal.  Negative for Chiari malformation.  Pituitary normal in size.  Negative for acute infarct.  No significant chronic ischemic change.  Negative for demyelinating disease.  The cerebral white matter is normal.  Brainstem and cerebellum are normal.  Negative for intracranial hemorrhage.  Negative for  infarct mass or fluid collection.  Paranasal sinuses are clear.  Image quality degraded by mild motion.  IMPRESSION: Negative   Original Report Authenticated By: Carl Best, M.D.   US Carotid Duplex Bilateral  04/12/2013   *RADIOLOGY REPORT*  Clinical Data: TIA, history hypertension, diabetes, smoking, hyperlipidemia  BILATERAL CAROTID DUPLEX ULTRASOUND  Technique: Gray scale imaging, color Doppler and duplex ultrasound were performed of bilateral carotid and vertebral arteries in the neck.  Comparison:  None  Criteria:  Quantification of carotid stenosis is based on velocity parameters that correlate the residual internal carotid diameter with NASCET-based stenosis levels, using the diameter of the distal internal carotid lumen as the denominator for stenosis measurement.  The following velocity measurements were obtained:                   PEAK SYSTOLIC/END DIASTOLIC RIGHT ICA:                        94/27cm/sec CCA:  [id number] SYSTOLIC ICA/CCA RATIO:     [id number] DIASTOLIC ICA/CCA RATIO:    1.58 ECA:                        111cm/sec  LEFT ICA:                        92/34cm/sec CCA:                        [id number] SYSTOLIC ICA/CCA RATIO:     [id number] DIASTOLIC ICA/CCA RATIO:    1.56 ECA:                        134cm/sec  Findings:  RIGHT CAROTID ARTERY: Intimal thickening right CCA. A combination of hypoechoic and echogenic shadowing plaque at right carotid bulb extending into proximal right ICA.  Laminar flow on color Doppler imaging though mild spectral broadening is seen on waveform analysis of the proximal right ICA.  No high velocity jets.  RIGHT VERTEBRAL ARTERY:  Patent, antegrade  LEFT CAROTID ARTERY: Intimal thickening left CCA.  Calcified shadowing and nonshadowing plaque at left carotid bulb into origin of the left ICA.  Minimally turbulent blood flow on color Doppler imaging was spectral broadening on waveform analysis at proximal left ICA.  Left ICA is mildly tortuous.  No  high velocity jets.  LEFT VERTEBRAL ARTERY:  Patent, antegrade  IMPRESSION: Plaque formation at carotid bifurcations bilaterally.  Velocity measurements correspond to less than 50% diameter stenoses bilaterally.   Original Report Authenticated By: Lavonia Dana, M.D.    Microbiology: No results found for this or any previous visit (from the past 240 hour(s)).   Labs: Basic Metabolic Panel:  Recent Labs Lab 04/11/13 2120 04/12/13 0611  NA 138 138  K 4.0 3.7  CL 97 102  CO2 31 30  GLUCOSE 136* 177*  BUN 17 16  CREATININE 1.60* 1.04  CALCIUM 10.8* 9.6   Liver Function Tests:  Recent Labs Lab 04/11/13 2120 04/12/13 0611  AST 24 19  ALT 22 17  ALKPHOS 92 79  BILITOT 0.6 0.7  PROT 7.7 6.6  ALBUMIN 4.1 3.3*   No results found for this basename: LIPASE, AMYLASE,  in the last 168 hours No results found for this basename: AMMONIA,  in the last 168 hours CBC:  Recent Labs Lab 04/11/13 2120 04/12/13 0611  WBC 8.8 7.4  HGB 15.2 13.5  HCT 43.4 39.1  MCV 86.3 85.9  PLT 242 205   Cardiac Enzymes:  Recent Labs Lab 04/11/13 2120 04/12/13 0022 04/12/13 0613  TROPONINI <0.30 <0.30 <0.30   BNP: BNP (last 3 results) No results found for this basename: PROBNP,  in the last 8760 hours CBG:  Recent Labs Lab 04/11/13 2113 04/12/13 0037 04/12/13 0853  GLUCAP 126* 134* 177*       Signed:  Dyanne Carrel M  [facility] 04/12/2013, 10:37 AM  Attending note:  Patient seen and examined.  Above note reviewed and amended.  Patient was admitted with syncopal episode. This is likely related to dehydration and volume depletion. Creatinine was elevated on admission. MRI was negative for any acute stroke. He was aggressively hydrated with IV fluids. He is not orthostatic and has clinically improved. Renal function is now back at baseline. Patient was advised to keep himself hydrated. Remainder workup was also unremarkable. Patient will be discharged home  today.  Devereaux Grayson

## 2013-04-12 NOTE — Progress Notes (Signed)
[facility] PROGRESS NOTE  William Schroeder [id number] DOB: 10/07/1960 DOA: 04/11/2013 PCP: Leonides Grills, MD  Assessment/Plan: Syncope versus TIA  Patient had a syncopal episode around 6 PM yesterday with neurological symptoms of ataxia, slurred speech all of which resolved. Patient's blood glucose was elevated around 6 PM. CT scan head in the ED shows no acute stroke. MRI negative and carotid doppler yield <50% diameter stenosis bilaterally. No events on tele. Cardiac enzymes neg x3. Orthostatic VS negative. TSH pending.   All the above symptoms also could have been due to hypoglycemia (since patient did not check the blood sugar at 3 PM before he took Humalog 14 units) and or dehydration from mowing the yard without eating anything around 3 PM.   Diabetes mellitus  Will continue Lantus, and start sliding scale insulin. CBG range 126-177. Appreciate diabetic coordinator input.   Constipation  Secondary to chronic use of opiates  We'll give Dulcolax suppository  Colace 100 mg by mouth twice a day   Nausea vomiting  Patient has a history of gastroparesis . Improved. Will continue Zofran when necessary. continue Reglan when necessary and Protonix 40 mg.   Acute kidney injury  Likely related to dehydration. Resolved with IV fluids. Continue IV normal saline.   Urinary hesitancy: likely related to dehydration. Will give fluid bolus. Bladder scan yields >200. In and out cath requested.   DVT prophylaxis  Lovenox   Code Status: full Family Communication: wife at bedside tion Plan: home when ready hopefully this afternoon   Consultants:  none  Procedures:  none  Antibiotics:  HPI/Subjective: Awake. Reports continued weakness and urinary hesitancy  Objective: Filed Vitals:   04/12/13 0536  BP: 102/69  Pulse: 77  Temp:   Resp:     Intake/Output Summary (Last 24 hours) at 04/12/13 0940 Last data filed at 04/12/13 0616  Gross per 24 hour  Intake  782.5 ml   Output      0 ml  Net  782.5 ml   Filed Weights   04/11/13 2102 04/12/13 0025  Weight: 196 lb (88.905 kg) 202 lb 3.2 oz (91.717 kg)    Exam:   General:  Well nourished   Cardiovascular: RRR No MGR no LE edema  Respiratory: Normal effort BS clear bilaterally no wheeze no rhonchi  Abdomen: soft +BS Non tender to palpation   Musculoskeletal: no clubbing no cyanosis   Data Reviewed: Basic Metabolic Panel:  Recent Labs Lab 04/11/13 2120 04/12/13 0611  NA 138 138  K 4.0 3.7  CL 97 102  CO2 31 30  GLUCOSE 136* 177*  BUN 17 16  CREATININE 1.60* 1.04  CALCIUM 10.8* 9.6   Liver Function Tests:  Recent Labs Lab 04/11/13 2120 04/12/13 0611  AST 24 19  ALT 22 17  ALKPHOS 92 79  BILITOT 0.6 0.7  PROT 7.7 6.6  ALBUMIN 4.1 3.3*   No results found for this basename: LIPASE, AMYLASE,  in the last 168 hours No results found for this basename: AMMONIA,  in the last 168 hours CBC:  Recent Labs Lab 04/11/13 2120 04/12/13 0611  WBC 8.8 7.4  HGB 15.2 13.5  HCT 43.4 39.1  MCV 86.3 85.9  PLT 242 205   Cardiac Enzymes:  Recent Labs Lab 04/11/13 2120 04/12/13 0022 04/12/13 0613  TROPONINI <0.30 <0.30 <0.30   BNP (last 3 results) No results found for this basename: PROBNP,  in the last 8760 hours CBG:  Recent Labs Lab 04/11/13 2113 04/12/13 0037 04/12/13 [id number]  GLUCAP 126* 134* 177*    No results found for this or any previous visit (from the past 240 hour(s)).   Studies: Ct Head Wo Contrast  04/11/2013   *RADIOLOGY REPORT*  Clinical Data: Syncope.  Fall with a blow to the head.  CT HEAD WITHOUT CONTRAST  Technique:  Contiguous axial images were obtained from the base of the skull through the vertex without contrast.  Comparison: None.  Findings: No evidence of acute intracranial abnormality including infarction, hemorrhage, mass lesion, mass effect, midline shift or abnormal extra-axial fluid collection is identified.  No hydrocephalus or pneumocephalus.   The calvarium is intact.  IMPRESSION: Negative examination.   Original Report Authenticated By: Orlean Patten, M.D.   Mr Brain Wo Contrast  04/12/2013   *RADIOLOGY REPORT*  Clinical Data: Syncope  MRI HEAD WITHOUT CONTRAST  Technique:  Multiplanar, multiecho pulse sequences of the brain and surrounding structures were obtained according to standard protocol without intravenous contrast.  Comparison: CT 04/11/2013  Findings: Ventricle size is normal.  Negative for Chiari malformation.  Pituitary normal in size.  Negative for acute infarct.  No significant chronic ischemic change.  Negative for demyelinating disease.  The cerebral white matter is normal.  Brainstem and cerebellum are normal.  Negative for intracranial hemorrhage.  Negative for infarct mass or fluid collection.  Paranasal sinuses are clear.  Image quality degraded by mild motion.  IMPRESSION: Negative   Original Report Authenticated By: Carl Best, M.D.   US Carotid Duplex Bilateral  04/12/2013   *RADIOLOGY REPORT*  Clinical Data: TIA, history hypertension, diabetes, smoking, hyperlipidemia  BILATERAL CAROTID DUPLEX ULTRASOUND  Technique: Gray scale imaging, color Doppler and duplex ultrasound were performed of bilateral carotid and vertebral arteries in the neck.  Comparison:  None  Criteria:  Quantification of carotid stenosis is based on velocity parameters that correlate the residual internal carotid diameter with NASCET-based stenosis levels, using the diameter of the distal internal carotid lumen as the denominator for stenosis measurement.  The following velocity measurements were obtained:                   PEAK SYSTOLIC/END DIASTOLIC RIGHT ICA:                        94/27cm/sec CCA:                        [id number] SYSTOLIC ICA/CCA RATIO:     [id number] DIASTOLIC ICA/CCA RATIO:    1.58 ECA:                        111cm/sec  LEFT ICA:                        92/34cm/sec CCA:                        [id number] SYSTOLIC ICA/CCA RATIO:      99991111 DIASTOLIC ICA/CCA RATIO:    1.56 ECA:                        134cm/sec  Findings:  RIGHT CAROTID ARTERY: Intimal thickening right CCA. A combination of hypoechoic and echogenic shadowing plaque at right carotid bulb extending into proximal right ICA.  Laminar flow on color Doppler imaging though mild spectral broadening is seen on waveform analysis of the proximal right ICA.  No high velocity jets.  RIGHT VERTEBRAL ARTERY:  Patent, antegrade  LEFT CAROTID ARTERY: Intimal thickening left CCA.  Calcified shadowing and nonshadowing plaque at left carotid bulb into origin of the left ICA.  Minimally turbulent blood flow on color Doppler imaging was spectral broadening on waveform analysis at proximal left ICA.  Left ICA is mildly tortuous.  No high velocity jets.  LEFT VERTEBRAL ARTERY:  Patent, antegrade  IMPRESSION: Plaque formation at carotid bifurcations bilaterally.  Velocity measurements correspond to less than 50% diameter stenoses bilaterally.   Original Report Authenticated By: Lavonia Dana, M.D.    Scheduled Meds: . aspirin EC  325 mg Oral Daily  . docusate sodium  100 mg Oral BID  . enoxaparin (LOVENOX) injection  40 mg Subcutaneous Q24H  . gabapentin  600 mg Oral BID  . insulin aspart  0-15 Units Subcutaneous TID WC  . insulin aspart  0-5 Units Subcutaneous QHS  . insulin glargine  20 Units Subcutaneous QHS  . lisinopril  20 mg Oral Daily  . lubiprostone  24 mcg Oral BID WC  . pantoprazole (PROTONIX) IV  40 mg Intravenous QHS  . simvastatin  40 mg Oral QPC supper  . sodium chloride  3 mL Intravenous Q12H   Continuous Infusions: . sodium chloride 75 mL/hr at 04/12/13 [id number]    Active Problems:   Constipation   Chronic, continuous use of opioids   Syncope   Diabetes mellitus   Urinary retention    Time spent: 30 minutes    [facility] Pager [phone]. If 7PM-7AM, please contact night-coverage at [email or web address], password [facility] 04/12/2013, 9:40 AM  LOS: 1  day    Attending note:  Patient seen and examined.  See discharge summary from later today.  Janiene Aarons

## 2013-04-12 NOTE — Progress Notes (Signed)
Inpatient Diabetes Program Recommendations  AACE/ADA: New Consensus Statement on Inpatient Glycemic Control (2013)  Target Ranges:  Prepandial:   less than 140 mg/dL      Peak postprandial:   less than 180 mg/dL (1-2 hours)      Critically ill patients:  140 - 180 mg/dL  Results for OVIE, SITZES (MRN [id number]) as of 04/12/2013 09:09  Ref. Range 06/18/2011 06:40  Hemoglobin A1C Latest Range: <5.7 % 11.0 (H)  Results for SRIVATSA, MASHEK (MRN [id number]) as of 04/12/2013 09:09  Ref. Range 04/11/2013 21:20 04/12/2013 06:11  Glucose Latest Range: 70-99 mg/dL 136 (H) 177 (H)   Results for LENWOOD, LEHMER (MRN [id number]) as of 04/12/2013 09:09  Ref. Range 04/11/2013 21:13 04/12/2013 00:37  Glucose-Capillary Latest Range: 70-99 mg/dL 126 (H) 134 (H)   Inpatient Diabetes Program Recommendations Insulin - Basal: Please consider decreasing Lantus to 20 units QHS (as there was not any Lantus given on 04/11/13 and fasting glucose this morning was 177 mg/dl). A1C:  Please consider ordering an A1C to determine glycemic control over the past 2-3 months.   Note: Patient has a history of diabetes and takes  Lantus 49-50 units QHS and Humalog 14 units TID with meals at home for diabetes management.  Currently, patient is ordered to receive Latnus 50 units QHS, Novolog 0-15 units AC, and Novolog 0-5 units HS for inpatient glycemic control.  According to the home medication list, patient last took Lantus at home on 8/3 and no Lantus has been given since being admitted.  Despite not receiving any basal insulin on 8/4, fasting glucose this morning was 177 mg/dl.  Please consider ordering an A1C to determine glycemic control over the past 2-3 months (last A1C was 11.0% on 06/18/11).  Also, please consider decreasing Lantus to 20 units QHS and adjust accordingly. Will continue to follow.  Thanks, William Alderman, RN, MSN, CCRN Diabetes Coordinator Inpatient Diabetes Program [phone]

## 2014-05-16 ENCOUNTER — Ambulatory Visit (INDEPENDENT_AMBULATORY_CARE_PROVIDER_SITE_OTHER): Payer: BC Managed Care – PPO | Admitting: Urology

## 2014-05-16 DIAGNOSIS — N529 Male erectile dysfunction, unspecified: Secondary | ICD-10-CM

## 2014-06-20 ENCOUNTER — Ambulatory Visit (INDEPENDENT_AMBULATORY_CARE_PROVIDER_SITE_OTHER): Payer: BC Managed Care – PPO | Admitting: Urology

## 2014-06-20 DIAGNOSIS — N5201 Erectile dysfunction due to arterial insufficiency: Secondary | ICD-10-CM

## 2014-07-05 ENCOUNTER — Encounter (HOSPITAL_COMMUNITY): Payer: Self-pay | Admitting: Emergency Medicine

## 2014-07-05 ENCOUNTER — Emergency Department (HOSPITAL_COMMUNITY)
Admission: EM | Admit: 2014-07-05 | Discharge: 2014-07-06 | Disposition: A | Discharge status: Home / Self Care | Payer: BC Managed Care – PPO | Attending: Emergency Medicine | Admitting: Emergency Medicine

## 2014-07-05 DIAGNOSIS — Z79899 Other long term (current) drug therapy: Secondary | ICD-10-CM | POA: Insufficient documentation

## 2014-07-05 DIAGNOSIS — Z794 Long term (current) use of insulin: Secondary | ICD-10-CM | POA: Diagnosis not present

## 2014-07-05 DIAGNOSIS — G8929 Other chronic pain: Secondary | ICD-10-CM | POA: Diagnosis not present

## 2014-07-05 DIAGNOSIS — I10 Essential (primary) hypertension: Secondary | ICD-10-CM | POA: Insufficient documentation

## 2014-07-05 DIAGNOSIS — Z87891 Personal history of nicotine dependence: Secondary | ICD-10-CM | POA: Diagnosis not present

## 2014-07-05 DIAGNOSIS — F329 Major depressive disorder, single episode, unspecified: Secondary | ICD-10-CM | POA: Insufficient documentation

## 2014-07-05 DIAGNOSIS — E785 Hyperlipidemia, unspecified: Secondary | ICD-10-CM | POA: Insufficient documentation

## 2014-07-05 DIAGNOSIS — Z8739 Personal history of other diseases of the musculoskeletal system and connective tissue: Secondary | ICD-10-CM | POA: Insufficient documentation

## 2014-07-05 DIAGNOSIS — Z8719 Personal history of other diseases of the digestive system: Secondary | ICD-10-CM | POA: Insufficient documentation

## 2014-07-05 DIAGNOSIS — F32A Depression, unspecified: Secondary | ICD-10-CM

## 2014-07-05 DIAGNOSIS — E119 Type 2 diabetes mellitus without complications: Secondary | ICD-10-CM | POA: Diagnosis not present

## 2014-07-05 LAB — URINALYSIS, ROUTINE W REFLEX MICROSCOPIC
Bilirubin Urine: NEGATIVE
HGB URINE DIPSTICK: NEGATIVE
Ketones, ur: NEGATIVE mg/dL
Leukocytes, UA: NEGATIVE
Nitrite: NEGATIVE
PROTEIN: NEGATIVE mg/dL
SPECIFIC GRAVITY, URINE: 1.041 — AB (ref 1.005–1.030)
Urobilinogen, UA: 0.2 mg/dL (ref 0.0–1.0)
pH: 5.5 (ref 5.0–8.0)

## 2014-07-05 LAB — RAPID URINE DRUG SCREEN, HOSP PERFORMED
Amphetamines: NOT DETECTED
Barbiturates: NOT DETECTED
Benzodiazepines: NOT DETECTED
COCAINE: NOT DETECTED
OPIATES: NOT DETECTED
TETRAHYDROCANNABINOL: NOT DETECTED

## 2014-07-05 LAB — CBC
HEMATOCRIT: 43.2 % (ref 39.0–52.0)
Hemoglobin: 14.9 g/dL (ref 13.0–17.0)
MCH: 29 pg (ref 26.0–34.0)
MCHC: 34.5 g/dL (ref 30.0–36.0)
MCV: 84 fL (ref 78.0–100.0)
PLATELETS: 244 10*3/uL (ref 150–400)
RBC: 5.14 MIL/uL (ref 4.22–5.81)
RDW: 12.9 % (ref 11.5–15.5)
WBC: 7.1 10*3/uL (ref 4.0–10.5)

## 2014-07-05 LAB — COMPREHENSIVE METABOLIC PANEL
ALBUMIN: 3.7 g/dL (ref 3.5–5.2)
ALK PHOS: 128 U/L — AB (ref 39–117)
ALT: 22 U/L (ref 0–53)
ANION GAP: 12 (ref 5–15)
AST: 16 U/L (ref 0–37)
BUN: 10 mg/dL (ref 6–23)
CO2: 28 mEq/L (ref 19–32)
Calcium: 10.4 mg/dL (ref 8.4–10.5)
Chloride: 97 mEq/L (ref 96–112)
Creatinine, Ser: 0.87 mg/dL (ref 0.50–1.35)
GFR calc Af Amer: >90 mL/min (ref >90)
GFR calc non Af Amer: >90 mL/min (ref >90)
Glucose, Bld: 437 mg/dL — ABNORMAL HIGH (ref 70–99)
POTASSIUM: 3.9 meq/L (ref 3.7–5.3)
SODIUM: 137 meq/L (ref 137–147)
TOTAL PROTEIN: 7.9 g/dL (ref 6.0–8.3)
Total Bilirubin: 0.5 mg/dL (ref 0.3–1.2)

## 2014-07-05 LAB — CBG MONITORING, ED: Glucose-Capillary: 343 mg/dL — ABNORMAL HIGH (ref 70–99)

## 2014-07-05 LAB — URINE MICROSCOPIC-ADD ON

## 2014-07-05 LAB — ACETAMINOPHEN LEVEL

## 2014-07-05 LAB — SALICYLATE LEVEL: Salicylate Lvl: <2 mg/dL — ABNORMAL LOW (ref 2.8–20.0)

## 2014-07-05 LAB — ETHANOL: Alcohol, Ethyl (B): <11 mg/dL (ref 0–11)

## 2014-07-05 MED ORDER — NICOTINE 21 MG/24HR TD PT24: 21.0000 mg | MEDICATED_PATCH | Freq: Every day | TRANSDERMAL | Status: DC

## 2014-07-05 MED ORDER — INSULIN GLARGINE 100 UNIT/ML ~~LOC~~ SOLN
50.0000 [IU] | Freq: Every day | SUBCUTANEOUS | Status: DC
  Administered 2014-07-06: 50 [IU] via SUBCUTANEOUS
  Filled 2014-07-05 (×2): qty 0.5

## 2014-07-05 MED ORDER — GABAPENTIN 300 MG PO CAPS
600.0000 mg | ORAL_CAPSULE | Freq: Every day | ORAL | Status: DC
  Administered 2014-07-06 (×2): 600 mg via ORAL
  Filled 2014-07-05 (×2): qty 2

## 2014-07-05 MED ORDER — INSULIN LISPRO 100 UNIT/ML ~~LOC~~ SOLN: 14.0000 [IU] | Freq: Three times a day (TID) | SUBCUTANEOUS | Status: DC

## 2014-07-05 MED ORDER — ACETAMINOPHEN 325 MG PO TABS: 650.0000 mg | ORAL_TABLET | ORAL | Status: DC | PRN

## 2014-07-05 MED ORDER — ZOLPIDEM TARTRATE 5 MG PO TABS: 5.0000 mg | ORAL_TABLET | Freq: Every evening | ORAL | Status: DC | PRN

## 2014-07-05 MED ORDER — OXYCODONE HCL 5 MG PO TABS
15.0000 mg | ORAL_TABLET | Freq: Three times a day (TID) | ORAL | Status: DC | PRN
  Administered 2014-07-06 (×2): 15 mg via ORAL
  Filled 2014-07-05 (×2): qty 3

## 2014-07-05 MED ORDER — IBUPROFEN 400 MG PO TABS: 600.0000 mg | ORAL_TABLET | Freq: Three times a day (TID) | ORAL | Status: DC | PRN

## 2014-07-05 MED ORDER — SIMVASTATIN 40 MG PO TABS
40.0000 mg | ORAL_TABLET | Freq: Every day | ORAL | Status: DC
  Filled 2014-07-05: qty 1

## 2014-07-05 MED ORDER — INSULIN ASPART 100 UNIT/ML ~~LOC~~ SOLN
14.0000 [IU] | Freq: Three times a day (TID) | SUBCUTANEOUS | Status: DC
  Administered 2014-07-06: 14 [IU] via SUBCUTANEOUS
  Filled 2014-07-05 (×2): qty 1

## 2014-07-05 MED ORDER — LISINOPRIL 20 MG PO TABS
20.0000 mg | ORAL_TABLET | Freq: Every day | ORAL | Status: DC
  Administered 2014-07-06: 20 mg via ORAL
  Filled 2014-07-05 (×2): qty 1

## 2014-07-05 MED ORDER — LORAZEPAM 1 MG PO TABS: 1.0000 mg | ORAL_TABLET | Freq: Three times a day (TID) | ORAL | Status: DC | PRN

## 2014-07-05 MED ORDER — ONDANSETRON HCL 4 MG PO TABS: 4.0000 mg | ORAL_TABLET | Freq: Three times a day (TID) | ORAL | Status: DC | PRN

## 2014-07-05 MED ORDER — ALUM & MAG HYDROXIDE-SIMETH 200-200-20 MG/5ML PO SUSP: 30.0000 mL | ORAL | Status: DC | PRN

## 2014-07-05 NOTE — ED Notes (Signed)
Kourosh Lesperance (pt's ex-wife) [phone] (cell)   [phone] (Nguyen's cell); Kim stated "I'll have his phone with me and they can call me on his number as well"

## 2014-07-05 NOTE — ED Notes (Signed)
Pt has family at bedside along with [employer] cabin crew.

## 2014-07-05 NOTE — ED Notes (Addendum)
Pt presents to ER for evaluation of SI. Pt sts he has felt suicidal for 1 month because he has lost his whole family. Pt recently separated. Ex wife sts last week he put a gun in is mouth but just couldn't pull the trigger. And has since been talking about killing himself. Pt started taking oxycodone for back pain again and family sts his personality is different when he takes them

## 2014-07-05 NOTE — ED Provider Notes (Signed)
Medical screening examination/treatment/procedure(s) were performed by non-physician practitioner and as supervising physician I was immediately available for consultation/collaboration.   EKG Interpretation None        Hoy Morn, MD 07/05/14 2240

## 2014-07-05 NOTE — ED Notes (Signed)
Pt CBG 343, PA, RN notified.

## 2014-07-05 NOTE — ED Provider Notes (Signed)
CSN: [id number]     Arrival date & time 07/05/14  1809 History   First MD Initiated Contact with Patient 07/05/14 1932     Chief Complaint  Patient presents with  . Depression     (Consider location/radiation/quality/duration/timing/severity/associated sxs/prior Treatment) HPI  William Schroeder is a 53 y.o. male with insulin-dependent diabetes, hypertension and chronic low back pain accompanied by his ex-wife presenting for evaluation of suicidal ideation. Patient has been in a state of depression since he was divorced last June. As per his ex-wife he has written a suicide note which was given to her several days ago.  He also is written suicide notes to his children.He is very active in local gun club and has many firearms. States that he put a gun in his mouth about a month ago, but did not pull the trigger. Patient's wife, family members, coworkers and friends have taken away his firearms and try not to leave him alone. Patient had a bed at a psychiatric hospital in [place] but that was given away after the patient and his wife were stuck in traffic for several hours. He does not have any history of depression or suicide attempts. He denies alcohol or drug abuse, auditory or usual hallucinations, chest pain, shortness of breath, headache, abdominal pain, change in bowel or bladder habits. Patient does endorse frequent nausea and vomiting states that he has gastroparesis. His diabetes is brittle and hard to control. His last A1c was 14. Patient states that he never checks his blood sugar but states he is compliant with his insulin. States he loves little Debbie's. States his primary care physician recently retired.   Past Medical History  Diagnosis Date  . Hypertension   . Diabetes mellitus   . GERD (gastroesophageal reflux disease)     not on Nexium for years  . Arthritis   . Chronic leg pain     since last back surgery  . Hyperlipidemia   . Gastroparesis    Past Surgical History   Procedure Laterality Date  . Steriod injection  01/16/2012    Procedure: MINOR STEROID INJECTION;  Surgeon: Lowella Grip, MD;  Location: [facility];  Service: Orthopedics;  Laterality: Right;  Epidural Steroid Injection  Right Lumbar 3-4 and Right Lumbar 5 - Sacral 1  . Gallbladder surgery    . Back surgery      two, last one 06/2011  . Esophagogastroduodenoscopy  04/2009    Dr. Jearld Shines 8mg IV/Demerol 50mg IV. erosive reflux esophagitis with single patch of salmon colored mucosa at distal esophagus. bx-no definite Barrett's  . Colonoscopy  04/2009    Dr. Hulda Humphrey diverticulum at ascending colon  . Colonoscopy with esophagogastroduodenoscopy (egd)  08/18/2012    Dr. Gala Romney: TCS with few colonic diverticulosis, , EGD with short segment biopsy-proven Barrett's, gastric and bulbar erosions. Negative H.pylori. Low-grade dysplasia of Barrett's.    Family History  Problem Relation Age of Onset  . Colon cancer Other     uncle  . Pancreatic cancer Sister   . Pancreatic cancer Brother   . Diabetes Mother   . Heart disease Mother   . Kidney disease Mother   . Diabetes Other     9 siblings   History  Substance Use Topics  . Smoking status: Former Smoker -- 1.00 packs/day for 20 years    Types: Cigarettes    Quit date: 12/31/1988  . Smokeless tobacco: Former Systems developer    Types: Snuff  . Alcohol Use: No  Review of Systems  10 systems reviewed and found to be negative, except as noted in the HPI.   Allergies  Review of patient's allergies indicates no known allergies.  Home Medications   Prior to Admission medications   Medication Sig Start Date End Date Taking? Authorizing Provider  gabapentin (NEURONTIN) 300 MG capsule Take 600 mg by mouth daily.    Yes Historical Provider, MD  insulin glargine (LANTUS) 100 UNIT/ML injection Inject 50 Units into the skin at bedtime.    Yes Historical Provider, MD  lisinopril (PRINIVIL,ZESTRIL) 20 MG tablet Take 20 mg  by mouth daily.   Yes Historical Provider, MD  oxyCODONE (ROXICODONE) 15 MG immediate release tablet Take 15 mg by mouth 3 (three) times daily as needed for pain.   Yes Historical Provider, MD  simvastatin (ZOCOR) 40 MG tablet Take 40 mg by mouth daily. 07/20/12  Yes Historical Provider, MD  insulin lispro (HUMALOG) 100 UNIT/ML injection Inject 14 Units into the skin 3 (three) times daily before meals.    Historical Provider, MD   BP 128/84  Pulse 80  Temp(Src) 98 F (36.7 C) (Oral)  Resp 18  Ht 6\' 1"  (1.854 m)  Wt 205 lb (92.987 kg)  BMI 27.05 kg/m2  SpO2 98% Physical Exam  Nursing note and vitals reviewed. Constitutional: He is oriented to person, place, and time. He appears well-developed and well-nourished. No distress.  Holding hands with ex-wife  HENT:  Head: Normocephalic and atraumatic.  Mouth/Throat: Oropharynx is clear and moist.  Eyes: Conjunctivae and EOM are normal. Pupils are equal, round, and reactive to light.  Neck: Normal range of motion.  Cardiovascular: Normal rate, regular rhythm and intact distal pulses.   Pulmonary/Chest: Effort normal and breath sounds normal. No stridor. No respiratory distress. He has no wheezes. He has no rales. He exhibits no tenderness.  Abdominal: He exhibits no distension. There is no tenderness. There is no rebound and no guarding.  Musculoskeletal: Normal range of motion.  Neurological: He is alert and oriented to person, place, and time.  Psychiatric: He is slowed. Cognition and memory are normal. He exhibits a depressed mood. He expresses suicidal ideation. He expresses no homicidal ideation. He expresses suicidal plans. He expresses no homicidal plans. He is noncommunicative.  Quiet, noncommunicative and tearful.    ED Course  Procedures (including critical care time) Labs Review Labs Reviewed  COMPREHENSIVE METABOLIC PANEL - Abnormal; Notable for the following:    Glucose, Bld 437 (*)    Alkaline Phosphatase 128 (*)    All  other components within normal limits  SALICYLATE LEVEL - Abnormal; Notable for the following:    Salicylate Lvl 123456 (*)    All other components within normal limits  URINALYSIS, ROUTINE W REFLEX MICROSCOPIC - Abnormal; Notable for the following:    Specific Gravity, Urine 1.041 (*)    Glucose, UA >1000 (*)    All other components within normal limits  CBG MONITORING, ED - Abnormal; Notable for the following:    Glucose-Capillary 343 (*)    All other components within normal limits  ACETAMINOPHEN LEVEL  CBC  ETHANOL  URINE RAPID DRUG SCREEN (HOSP PERFORMED)  URINE MICROSCOPIC-ADD ON  CBG MONITORING, ED    Imaging Review No results found.   EKG Interpretation None      MDM   Final diagnoses:  None    Filed Vitals:   07/05/14 1817  BP: 128/84  Pulse: 80  Temp: 98 F (36.7 C)  TempSrc: Oral  Resp:  18  Height: 6\' 1"  (1.854 m)  Weight: 205 lb (92.987 kg)  SpO2: 98%    Medications  alum & mag hydroxide-simeth (MAALOX/MYLANTA) 200-200-20 MG/5ML suspension 30 mL (not administered)  ondansetron (ZOFRAN) tablet 4 mg (not administered)  nicotine (NICODERM CQ - dosed in mg/24 hours) patch 21 mg (not administered)  zolpidem (AMBIEN) tablet 5 mg (not administered)  ibuprofen (ADVIL,MOTRIN) tablet 600 mg (not administered)  acetaminophen (TYLENOL) tablet 650 mg (not administered)  LORazepam (ATIVAN) tablet 1 mg (not administered)  gabapentin (NEURONTIN) capsule 600 mg (not administered)  lisinopril (PRINIVIL,ZESTRIL) tablet 20 mg (not administered)  oxyCODONE (Oxy IR/ROXICODONE) immediate release tablet 15 mg (not administered)  simvastatin (ZOCOR) tablet 40 mg (not administered)    William Schroeder is a 53 y.o. male presenting to the ED voluntarily for suicidal ideation and possible attempt with a firearm. Patient has been given written suicide notes. Middle-aged man with access to firearms: He is very high risk for completing suicide attempt. Patient will need to be  involuntarily committed if he changes his mind and wants to leave the ED.  Patient's blood sugar is elevated at 350. He has no one anion gap. No ketones in his urine. Patient is noncompliant with dietary restrictions. I'm not going to order his home insulin regimen I think sliding scale will be more effective.  Patient is medically cleared for psychiatric evaluation will be transferred to the psych ED. TTS consulted, home meds and psych standard holding orders placed.    Monico Blitz, PA-C 07/05/14 2229

## 2014-07-05 NOTE — ED Notes (Signed)
Staffing notified of pt for sitter. Pt changed into scrubs and security at bedside to wand pt.

## 2014-07-05 NOTE — ED Notes (Signed)
Pt has changed into wine colored scrubs and non-slip socks; pt's personal street clothes and shoes have been placed into a personal belongings bag and handed to pt's ex-wife, Maudie Mercury, to take home; security has been called to wand pt and pt is being wanded at this time; Eme, RN is calling [facility] at this time for a sitter; Maudie Mercury has been given Pod C's visiting hours

## 2014-07-06 ENCOUNTER — Inpatient Hospital Stay (HOSPITAL_COMMUNITY)
Admission: AD | Admit: 2014-07-06 | Discharge: 2014-07-12 | DRG: 885 | Disposition: A | Discharge status: Home / Self Care | Payer: BC Managed Care – PPO | Source: Intra-hospital | Attending: Emergency Medicine | Admitting: Emergency Medicine

## 2014-07-06 ENCOUNTER — Inpatient Hospital Stay (HOSPITAL_COMMUNITY): Payer: BC Managed Care – PPO

## 2014-07-06 ENCOUNTER — Encounter (HOSPITAL_COMMUNITY): Payer: Self-pay | Admitting: *Deleted

## 2014-07-06 DIAGNOSIS — F329 Major depressive disorder, single episode, unspecified: Secondary | ICD-10-CM | POA: Diagnosis not present

## 2014-07-06 DIAGNOSIS — G4700 Insomnia, unspecified: Secondary | ICD-10-CM | POA: Diagnosis present

## 2014-07-06 DIAGNOSIS — Z79891 Long term (current) use of opiate analgesic: Secondary | ICD-10-CM | POA: Diagnosis not present

## 2014-07-06 DIAGNOSIS — E780 Pure hypercholesterolemia: Secondary | ICD-10-CM | POA: Diagnosis present

## 2014-07-06 DIAGNOSIS — F322 Major depressive disorder, single episode, severe without psychotic features: Secondary | ICD-10-CM

## 2014-07-06 DIAGNOSIS — Z87891 Personal history of nicotine dependence: Secondary | ICD-10-CM | POA: Diagnosis not present

## 2014-07-06 DIAGNOSIS — F17200 Nicotine dependence, unspecified, uncomplicated: Secondary | ICD-10-CM

## 2014-07-06 DIAGNOSIS — E1165 Type 2 diabetes mellitus with hyperglycemia: Secondary | ICD-10-CM | POA: Diagnosis present

## 2014-07-06 DIAGNOSIS — M1990 Unspecified osteoarthritis, unspecified site: Secondary | ICD-10-CM | POA: Diagnosis present

## 2014-07-06 DIAGNOSIS — R42 Dizziness and giddiness: Secondary | ICD-10-CM

## 2014-07-06 DIAGNOSIS — F419 Anxiety disorder, unspecified: Secondary | ICD-10-CM | POA: Diagnosis present

## 2014-07-06 DIAGNOSIS — E785 Hyperlipidemia, unspecified: Secondary | ICD-10-CM | POA: Diagnosis present

## 2014-07-06 DIAGNOSIS — K219 Gastro-esophageal reflux disease without esophagitis: Secondary | ICD-10-CM | POA: Diagnosis present

## 2014-07-06 DIAGNOSIS — I10 Essential (primary) hypertension: Secondary | ICD-10-CM | POA: Diagnosis present

## 2014-07-06 DIAGNOSIS — Z833 Family history of diabetes mellitus: Secondary | ICD-10-CM | POA: Diagnosis not present

## 2014-07-06 DIAGNOSIS — Z23 Encounter for immunization: Secondary | ICD-10-CM | POA: Diagnosis not present

## 2014-07-06 DIAGNOSIS — F32A Depression, unspecified: Secondary | ICD-10-CM | POA: Diagnosis present

## 2014-07-06 DIAGNOSIS — F332 Major depressive disorder, recurrent severe without psychotic features: Secondary | ICD-10-CM | POA: Diagnosis not present

## 2014-07-06 DIAGNOSIS — Z8249 Family history of ischemic heart disease and other diseases of the circulatory system: Secondary | ICD-10-CM

## 2014-07-06 DIAGNOSIS — Z794 Long term (current) use of insulin: Secondary | ICD-10-CM

## 2014-07-06 DIAGNOSIS — I959 Hypotension, unspecified: Secondary | ICD-10-CM

## 2014-07-06 DIAGNOSIS — R739 Hyperglycemia, unspecified: Secondary | ICD-10-CM

## 2014-07-06 DIAGNOSIS — R45851 Suicidal ideations: Secondary | ICD-10-CM | POA: Diagnosis present

## 2014-07-06 DIAGNOSIS — K3184 Gastroparesis: Secondary | ICD-10-CM | POA: Diagnosis present

## 2014-07-06 DIAGNOSIS — G8929 Other chronic pain: Secondary | ICD-10-CM | POA: Diagnosis present

## 2014-07-06 DIAGNOSIS — E119 Type 2 diabetes mellitus without complications: Secondary | ICD-10-CM | POA: Insufficient documentation

## 2014-07-06 DIAGNOSIS — Z800 Family history of malignant neoplasm of digestive organs: Secondary | ICD-10-CM | POA: Diagnosis not present

## 2014-07-06 DIAGNOSIS — F333 Major depressive disorder, recurrent, severe with psychotic symptoms: Secondary | ICD-10-CM

## 2014-07-06 DIAGNOSIS — Z720 Tobacco use: Secondary | ICD-10-CM

## 2014-07-06 LAB — COMPREHENSIVE METABOLIC PANEL
ALBUMIN: 3.3 g/dL — AB (ref 3.5–5.2)
ALK PHOS: 109 U/L (ref 39–117)
ALT: 22 U/L (ref 0–53)
AST: 16 U/L (ref 0–37)
Anion gap: 10 (ref 5–15)
BUN: 13 mg/dL (ref 6–23)
CHLORIDE: 101 meq/L (ref 96–112)
CO2: 28 mEq/L (ref 19–32)
Calcium: 10 mg/dL (ref 8.4–10.5)
Creatinine, Ser: 0.83 mg/dL (ref 0.50–1.35)
GFR calc Af Amer: >90 mL/min (ref >90)
GFR calc non Af Amer: >90 mL/min (ref >90)
Glucose, Bld: 206 mg/dL — ABNORMAL HIGH (ref 70–99)
Potassium: 4.1 mEq/L (ref 3.7–5.3)
SODIUM: 139 meq/L (ref 137–147)
TOTAL PROTEIN: 7.1 g/dL (ref 6.0–8.3)
Total Bilirubin: 0.4 mg/dL (ref 0.3–1.2)

## 2014-07-06 LAB — URINALYSIS, ROUTINE W REFLEX MICROSCOPIC
Bilirubin Urine: NEGATIVE
Hgb urine dipstick: NEGATIVE
Ketones, ur: NEGATIVE mg/dL
Leukocytes, UA: NEGATIVE
Nitrite: NEGATIVE
PH: 5.5 (ref 5.0–8.0)
Protein, ur: NEGATIVE mg/dL
Specific Gravity, Urine: 1.036 — ABNORMAL HIGH (ref 1.005–1.030)
Urobilinogen, UA: 1 mg/dL (ref 0.0–1.0)

## 2014-07-06 LAB — CBC
HCT: 40.6 % (ref 39.0–52.0)
HEMOGLOBIN: 14.2 g/dL (ref 13.0–17.0)
MCH: 29.5 pg (ref 26.0–34.0)
MCHC: 35 g/dL (ref 30.0–36.0)
MCV: 84.4 fL (ref 78.0–100.0)
Platelets: 234 10*3/uL (ref 150–400)
RBC: 4.81 MIL/uL (ref 4.22–5.81)
RDW: 12.9 % (ref 11.5–15.5)
WBC: 7.8 10*3/uL (ref 4.0–10.5)

## 2014-07-06 LAB — BLOOD GAS, VENOUS
Acid-Base Excess: 1.3 mmol/L (ref 0.0–2.0)
Bicarbonate: 27.5 mEq/L — ABNORMAL HIGH (ref 20.0–24.0)
FIO2: 0.21 %
O2 Saturation: 77.1 %
PCO2 VEN: 51.1 mmHg — AB (ref 45.0–50.0)
PH VEN: 7.348 — AB (ref 7.250–7.300)
Patient temperature: 98.1
TCO2: 24.5 mmol/L (ref 0–100)

## 2014-07-06 LAB — I-STAT TROPONIN, ED: Troponin i, poc: 0.01 ng/mL (ref 0.00–0.08)

## 2014-07-06 LAB — CBG MONITORING, ED
GLUCOSE-CAPILLARY: 205 mg/dL — AB (ref 70–99)
GLUCOSE-CAPILLARY: 293 mg/dL — AB (ref 70–99)
GLUCOSE-CAPILLARY: 180 mg/dL — AB (ref 70–99)
GLUCOSE-CAPILLARY: 237 mg/dL — AB (ref 70–99)

## 2014-07-06 LAB — GLUCOSE, CAPILLARY
Glucose-Capillary: 249 mg/dL — ABNORMAL HIGH (ref 70–99)
Glucose-Capillary: 420 mg/dL — ABNORMAL HIGH (ref 70–99)
Glucose-Capillary: 506 mg/dL — ABNORMAL HIGH (ref 70–99)

## 2014-07-06 LAB — URINE MICROSCOPIC-ADD ON: URINE-OTHER: NONE SEEN

## 2014-07-06 MED ORDER — ALUM & MAG HYDROXIDE-SIMETH 200-200-20 MG/5ML PO SUSP
30.0000 mL | ORAL | Status: DC | PRN
  Filled 2014-07-06: qty 30

## 2014-07-06 MED ORDER — GLUCERNA SHAKE PO LIQD
237.0000 mL | Freq: Three times a day (TID) | ORAL | Status: DC
  Administered 2014-07-06 – 2014-07-12 (×6): 237 mL via ORAL
  Filled 2014-07-06: qty 237

## 2014-07-06 MED ORDER — LISINOPRIL 20 MG PO TABS
20.0000 mg | ORAL_TABLET | Freq: Every day | ORAL | Status: DC
  Administered 2014-07-07 – 2014-07-11 (×5): 20 mg via ORAL
  Filled 2014-07-06 (×9): qty 1

## 2014-07-06 MED ORDER — SODIUM CHLORIDE 0.9 % IV BOLUS (SEPSIS)
1000.0000 mL | Freq: Once | INTRAVENOUS | Status: AC
  Administered 2014-07-06: 1000 mL via INTRAVENOUS

## 2014-07-06 MED ORDER — NICOTINE POLACRILEX 2 MG MT GUM
2.0000 mg | CHEWING_GUM | OROMUCOSAL | Status: DC | PRN
  Administered 2014-07-06 – 2014-07-11 (×9): 2 mg via ORAL
  Filled 2014-07-06 (×2): qty 1

## 2014-07-06 MED ORDER — SIMVASTATIN 40 MG PO TABS
40.0000 mg | ORAL_TABLET | Freq: Every day | ORAL | Status: DC
  Administered 2014-07-07 – 2014-07-08 (×2): 40 mg via ORAL
  Administered 2014-07-08: 20 mg via ORAL
  Administered 2014-07-09 – 2014-07-11 (×3): 40 mg via ORAL
  Filled 2014-07-06 (×5): qty 1
  Filled 2014-07-06: qty 2
  Filled 2014-07-06 (×3): qty 1

## 2014-07-06 MED ORDER — INSULIN ASPART 100 UNIT/ML ~~LOC~~ SOLN: 0.0000 [IU] | Freq: Three times a day (TID) | SUBCUTANEOUS | Status: DC

## 2014-07-06 MED ORDER — INFLUENZA VAC SPLIT QUAD 0.5 ML IM SUSY
0.5000 mL | PREFILLED_SYRINGE | INTRAMUSCULAR | Status: AC
  Administered 2014-07-09: 0.5 mL via INTRAMUSCULAR
  Filled 2014-07-06 (×2): qty 0.5

## 2014-07-06 MED ORDER — MUSCLE RUB 10-15 % EX CREA
TOPICAL_CREAM | CUTANEOUS | Status: DC | PRN
  Administered 2014-07-10: 09:00:00 via TOPICAL

## 2014-07-06 MED ORDER — TRAZODONE HCL 50 MG PO TABS
50.0000 mg | ORAL_TABLET | Freq: Every day | ORAL | Status: DC
  Administered 2014-07-07 – 2014-07-10 (×5): 50 mg via ORAL
  Filled 2014-07-06 (×8): qty 1

## 2014-07-06 MED ORDER — PNEUMOCOCCAL VAC POLYVALENT 25 MCG/0.5ML IJ INJ
0.5000 mL | INJECTION | INTRAMUSCULAR | Status: AC
  Administered 2014-07-09: 0.5 mL via INTRAMUSCULAR
  Filled 2014-07-06: qty 0.5

## 2014-07-06 MED ORDER — HYDROXYZINE HCL 25 MG PO TABS
25.0000 mg | ORAL_TABLET | Freq: Three times a day (TID) | ORAL | Status: DC | PRN
  Administered 2014-07-10: 25 mg via ORAL
  Filled 2014-07-06 (×2): qty 1

## 2014-07-06 MED ORDER — SERTRALINE HCL 25 MG PO TABS
25.0000 mg | ORAL_TABLET | Freq: Every day | ORAL | Status: DC
  Administered 2014-07-06: 25 mg via ORAL
  Filled 2014-07-06 (×3): qty 1

## 2014-07-06 MED ORDER — SERTRALINE HCL 25 MG PO TABS
25.0000 mg | ORAL_TABLET | Freq: Every day | ORAL | Status: DC
  Administered 2014-07-07 – 2014-07-10 (×4): 25 mg via ORAL
  Filled 2014-07-06 (×6): qty 1

## 2014-07-06 MED ORDER — HYDROCERIN EX CREA
TOPICAL_CREAM | Freq: Two times a day (BID) | CUTANEOUS | Status: DC
  Administered 2014-07-07 – 2014-07-11 (×8): via TOPICAL
  Administered 2014-07-12: 1 via TOPICAL
  Filled 2014-07-06: qty 113

## 2014-07-06 MED ORDER — MAGNESIUM HYDROXIDE 400 MG/5ML PO SUSP
30.0000 mL | Freq: Every day | ORAL | Status: DC | PRN
  Filled 2014-07-06: qty 30

## 2014-07-06 MED ORDER — INSULIN GLARGINE 100 UNIT/ML ~~LOC~~ SOLN
60.0000 [IU] | Freq: Every day | SUBCUTANEOUS | Status: DC
  Administered 2014-07-07 (×2): 60 [IU] via SUBCUTANEOUS
  Filled 2014-07-06: qty 0.6

## 2014-07-06 MED ORDER — ACETAMINOPHEN 325 MG PO TABS
650.0000 mg | ORAL_TABLET | Freq: Four times a day (QID) | ORAL | Status: DC | PRN
  Filled 2014-07-06 (×2): qty 2

## 2014-07-06 MED ORDER — INSULIN ASPART 100 UNIT/ML ~~LOC~~ SOLN
0.0000 [IU] | Freq: Three times a day (TID) | SUBCUTANEOUS | Status: DC
  Administered 2014-07-06: 15 [IU] via SUBCUTANEOUS
  Administered 2014-07-07: 3 [IU] via SUBCUTANEOUS
  Administered 2014-07-07: 5 [IU] via SUBCUTANEOUS
  Administered 2014-07-07: 8 [IU] via SUBCUTANEOUS
  Administered 2014-07-08 (×2): 5 [IU] via SUBCUTANEOUS
  Administered 2014-07-08 – 2014-07-09 (×2): 3 [IU] via SUBCUTANEOUS
  Administered 2014-07-09: 5 [IU] via SUBCUTANEOUS

## 2014-07-06 MED ORDER — OXYCODONE HCL 5 MG PO TABS
15.0000 mg | ORAL_TABLET | Freq: Three times a day (TID) | ORAL | Status: DC | PRN
  Administered 2014-07-06 – 2014-07-12 (×12): 15 mg via ORAL
  Filled 2014-07-06 (×13): qty 3

## 2014-07-06 MED ORDER — INSULIN GLARGINE 100 UNIT/ML ~~LOC~~ SOLN: 50.0000 [IU] | Freq: Every day | SUBCUTANEOUS | Status: DC

## 2014-07-06 NOTE — ED Notes (Signed)
RN notified CBG 205

## 2014-07-06 NOTE — ED Provider Notes (Signed)
Patient accepted to behavioral health. Accepting is Dr. Barbette Or, MD 07/06/14 [phone]

## 2014-07-06 NOTE — ED Provider Notes (Signed)
CSN: [id number]     Arrival date & time 07/06/14  1759 History   First MD Initiated Contact with Patient 07/06/14 1833     Chief Complaint  Patient presents with  . Hyperglycemia  . orthostatic Hypotension      (Consider location/radiation/quality/duration/timing/severity/associated sxs/prior Treatment) Patient is a 53 y.o. male presenting with hyperglycemia. The history is provided by the patient.  Hyperglycemia Blood sugar level PTA:  505 Severity:  Moderate Onset quality:  Gradual Timing:  Constant Progression:  Unchanged Chronicity:  New Diabetes status:  Controlled with insulin Context: not noncompliance and not recent illness   Relieved by:  Nothing Ineffective treatments:  None tried Associated symptoms: dizziness (with standing)   Associated symptoms: no chest pain, no confusion, no fever, no shortness of breath and no vomiting     Past Medical History  Diagnosis Date  . Hypertension   . Diabetes mellitus   . GERD (gastroesophageal reflux disease)     not on Nexium for years  . Arthritis   . Chronic leg pain     since last back surgery  . Hyperlipidemia   . Gastroparesis    Past Surgical History  Procedure Laterality Date  . Steriod injection  01/16/2012    Procedure: MINOR STEROID INJECTION;  Surgeon: Lowella Grip, MD;  Location: [facility];  Service: Orthopedics;  Laterality: Right;  Epidural Steroid Injection  Right Lumbar 3-4 and Right Lumbar 5 - Sacral 1  . Gallbladder surgery    . Back surgery      two, last one 06/2011  . Esophagogastroduodenoscopy  04/2009    Dr. Jearld Shines 8mg IV/Demerol 50mg IV. erosive reflux esophagitis with single patch of salmon colored mucosa at distal esophagus. bx-no definite Barrett's  . Colonoscopy  04/2009    Dr. Hulda Humphrey diverticulum at ascending colon  . Colonoscopy with esophagogastroduodenoscopy (egd)  08/18/2012    Dr. Gala Romney: TCS with few colonic diverticulosis, , EGD with short segment  biopsy-proven Barrett's, gastric and bulbar erosions. Negative H.pylori. Low-grade dysplasia of Barrett's.    Family History  Problem Relation Age of Onset  . Colon cancer Other     uncle  . Pancreatic cancer Sister   . Pancreatic cancer Brother   . Diabetes Mother   . Heart disease Mother   . Kidney disease Mother   . Diabetes Other     9 siblings   History  Substance Use Topics  . Smoking status: Former Smoker -- 1.00 packs/day for 20 years    Types: Cigarettes    Quit date: 12/31/1988  . Smokeless tobacco: Former Systems developer    Types: Snuff  . Alcohol Use: No    Review of Systems  Constitutional: Negative for fever and chills.  Respiratory: Negative for cough and shortness of breath.   Cardiovascular: Negative for chest pain.  Gastrointestinal: Negative for vomiting.  Neurological: Positive for dizziness (with standing).  Psychiatric/Behavioral: Negative for confusion.  All other systems reviewed and are negative.     Allergies  Review of patient's allergies indicates no known allergies.  Home Medications   Prior to Admission medications   Medication Sig Start Date End Date Taking? Authorizing Provider  esomeprazole (NEXIUM) 20 MG capsule 1 po qd 01/14/01  Yes Historical Provider, MD  gabapentin (NEURONTIN) 300 MG capsule Take 600 mg by mouth daily.    Yes Historical Provider, MD  insulin glargine (LANTUS) 100 UNIT/ML injection Inject 50 Units into the skin at bedtime.    Yes Historical Provider, MD  insulin  lispro (HUMALOG) 100 UNIT/ML injection Inject 14 Units into the skin 3 (three) times daily before meals.   Yes Historical Provider, MD  lisinopril (PRINIVIL,ZESTRIL) 20 MG tablet Take 20 mg by mouth daily.   Yes Historical Provider, MD  metFORMIN (GLUCOPHAGE) 1000 MG tablet 1 po bid 01/14/01  Yes Historical Provider, MD  oxyCODONE (ROXICODONE) 15 MG immediate release tablet Take 15 mg by mouth 3 (three) times daily as needed for pain.   Yes Historical Provider, MD   pioglitazone (ACTOS) 15 MG tablet 1 po q day 04/20/01  Yes Historical Provider, MD  ramipril (ALTACE) 5 MG capsule one po q day 04/20/01  Yes Historical Provider, MD  simvastatin (ZOCOR) 40 MG tablet Take 40 mg by mouth daily. 07/20/12  Yes Historical Provider, MD   BP 100/49  Pulse 60  Temp(Src) 98.1 F (36.7 C) (Oral)  Resp 20  Ht 5\' 9"  (1.753 m)  Wt 204 lb (92.534 kg)  BMI 30.11 kg/m2  SpO2 96% Physical Exam  Nursing note and vitals reviewed. Constitutional: He is oriented to person, place, and time. He appears well-developed and well-nourished. No distress.  HENT:  Head: Normocephalic and atraumatic.  Mouth/Throat: No oropharyngeal exudate.  Eyes: EOM are normal. Pupils are equal, round, and reactive to light.  Neck: Normal range of motion. Neck supple.  Cardiovascular: Normal rate and regular rhythm.  Exam reveals no friction rub.   No murmur heard. Pulmonary/Chest: Effort normal and breath sounds normal. No respiratory distress. He has no wheezes. He has no rales.  Abdominal: He exhibits no distension. There is no tenderness. There is no rebound.  Musculoskeletal: Normal range of motion. He exhibits no edema.  Neurological: He is alert and oriented to person, place, and time. No cranial nerve deficit. He exhibits normal muscle tone. Coordination normal.  Skin: No rash noted. He is not diaphoretic.    ED Course  Procedures (including critical care time) Labs Review Labs Reviewed  GLUCOSE, CAPILLARY - Abnormal; Notable for the following:    Glucose-Capillary 249 (*)    All other components within normal limits  GLUCOSE, CAPILLARY - Abnormal; Notable for the following:    Glucose-Capillary 506 (*)    All other components within normal limits  GLUCOSE, CAPILLARY - Abnormal; Notable for the following:    Glucose-Capillary 420 (*)    All other components within normal limits  CBG MONITORING, ED - Abnormal; Notable for the following:    Glucose-Capillary 293 (*)    All  other components within normal limits  HEMOGLOBIN A1C  CBC  COMPREHENSIVE METABOLIC PANEL  URINALYSIS, ROUTINE W REFLEX MICROSCOPIC  BLOOD GAS, VENOUS  I-STAT TROPOININ, ED    Imaging Review Dg Chest 2 View  07/06/2014   CLINICAL DATA:  Medical clearance, dizziness, diabetes  EXAM: CHEST  2 VIEW  COMPARISON:  01/20/2013  FINDINGS: Normal cardiac silhouette. No effusion, infiltrate, or pneumothorax. Degenerative osteophytosis of the thoracic spine. Cholecystectomy clips noted  IMPRESSION: No acute cardiopulmonary process.   Electronically Signed   By: Suzy Bouchard M.D.   On: 07/06/2014 19:27     EKG Interpretation None      MDM   Final diagnoses:  Dizzy  Hyperglycemia  Hypotension, unspecified hypotension type    19M presents with dizziness. Is currently at inpatient Psych, was dizzy, had some orthostatic hypotension. Has known very uncontrolled diabetes, states last A1C was around 14.  Denies CP, SOB, vomiting, abdominal pain. Will check labs, given fluids, recheck orthostatics.  Labs ok, sugars greatly  improving. BPs improving, however still orthostatic after 1 L. Not still orthostatic after 2nd liter. Stable for discharge.   Evelina Bucy, MD 07/07/14 Dyann Kief

## 2014-07-06 NOTE — Progress Notes (Signed)
Patient ID: William Schroeder, male   DOB: 1961-05-27, 53 y.o.   MRN: [id number] Non emergency 911 called to transfer patient to [facility] for further monitoring.  Patient has BP in 70's and CBG 506.

## 2014-07-06 NOTE — ED Notes (Signed)
Orthostatic vital signs obtained: Lying 115/63; 53 Sitting 104/65; 66 Standing 93/49; 68

## 2014-07-06 NOTE — Progress Notes (Signed)
Patient ID: William Schroeder, male   DOB: [birth year], 53 y.o.   MRN: [id number]  Patient's CBG elevated. NP was notified and RN advised to give 15 units of Novolog per sliding scale. On recheck patient's CBG went from 506 to 420. Patient will be transferred to hospital for increased CBG.

## 2014-07-06 NOTE — ED Notes (Signed)
Can of tobacco dip was seen on pts beside table upon entering room.  Asked pt to remove the tobacco from his mouth.  Belonging labeled to transfer with pt to [facility].

## 2014-07-06 NOTE — Progress Notes (Signed)
Inpatient Diabetes Program Recommendations  AACE/ADA: New Consensus Statement on Inpatient Glycemic Control (2013)  Target Ranges:  Prepandial:   less than 140 mg/dL      Peak postprandial:   less than 180 mg/dL (1-2 hours)      Critically ill patients:  140 - 180 mg/dL   Reason for Assessment: Diabetes Consult  Diabetes history: DM2 Outpatient Diabetes medications: Lantus 80 units QHS and Novolog 14 units tidwc Current orders for Inpatient glycemic control: Lantus 50 units QHS and Novolog moderate tidwc Results for William Schroeder, William Schroeder (MRN [id number]) as of 07/06/2014 14:53  Ref. Range 07/05/2014 18:23  Sodium Latest Range: 137-147 mEq/L 137  Potassium Latest Range: 3.7-5.3 mEq/L 3.9  Chloride Latest Range: 96-112 mEq/L 97  CO2 Latest Range: 19-32 mEq/L 28  BUN Latest Range: 6-23 mg/dL 10  Creatinine Latest Range: 0.50-1.35 mg/dL 0.87  Calcium Latest Range: 8.4-10.5 mg/dL 10.4  GFR calc non Af Amer Latest Range: >90 mL/min >90  GFR calc Af Amer Latest Range: >90 mL/min >90  Glucose Latest Range: 70-99 mg/dL 437 (H)  Anion gap Latest Range: 5-15  12  Results for William Schroeder, William Schroeder (MRN [id number]) as of 07/06/2014 14:53  Ref. Range 07/05/2014 19:51 07/06/2014 00:14 07/06/2014 07:16 07/06/2014 11:51  Glucose-Capillary Latest Range: 70-99 mg/dL 343 (H) 237 (H) 205 (H) 249 (H)   Hyperglycemia with blood sugars running in 200-300s. Received Lantus 50 units QHS last night.  Titrate Lantus until FBS < 180 mg/dL. Add Novolog moderate tidwc and hs. HgbA1C - pending  Will continue to follow. Thank you. Lorenda Peck, RD, LDN, CDE Inpatient Diabetes Coordinator [phone]

## 2014-07-06 NOTE — Progress Notes (Signed)
Patient ID: William Schroeder, male   DOB: [birth year], 53 y.o.   MRN: [id number] Pt sent per EMS to [facility] ED. Report given to EMS. Face sheet and providers orders given to EMS. Pt's belongings remain at [facility]. Elenore Rota, RN

## 2014-07-06 NOTE — BH Assessment (Signed)
Tele Assessment Note   William Schroeder is an 53 y.o. male presenting to ED with suicidal thoughts with planning, having written suicide notes to his ex-wife and children. Pt is alert and oriented times 4. Mood is depressed and anxious with congruent affect. Speech is logical and coherent, judgement partial. Pt denies HI, a/v hallucination, SA, self-harm, and abuse. Family hx is positive for substance abuse, mental health concerns and completed suicide.   Pt reports he has has mild depressive sx in the past, "Nothing major." Current depressive episode began when his divorce was finalized in June 2015 after 76 years of marriage. "It's not what I wanted." Reports he has been feeling suicidal. Last month he put a gun in his mouth, "I couldn't go through with it." Pt's ex wife now has his guns. Pt reports he has been staying with his ex wife for several weeks because he does not feel safe. Pt endorses the following sx of depression: crying spells, hopelessness, loss of pleasure, loss of motivation, feeling guilty, and more irritable.   Pt reports he worries some but denies panic attacks, phobias or sx of PTSD.   Pt reports he came to ED for help because he wants to get rid of his suicidal ideations. He is willing to participate in treatment. Pt has not prior MH treatment hx.   Axis I: 296.23 Major Depressive Disorder, Severe Axis II: Deferred Axis III:  Past Medical History  Diagnosis Date  . Hypertension   . Diabetes mellitus   . GERD (gastroesophageal reflux disease)     not on Nexium for years  . Arthritis   . Chronic leg pain     since last back surgery  . Hyperlipidemia   . Gastroparesis    Axis IV: economic problems and problems with primary support group Axis V: 35  Past Medical History:  Past Medical History  Diagnosis Date  . Hypertension   . Diabetes mellitus   . GERD (gastroesophageal reflux disease)     not on Nexium for years  . Arthritis   . Chronic leg pain     since  last back surgery  . Hyperlipidemia   . Gastroparesis     Past Surgical History  Procedure Laterality Date  . Steriod injection  01/16/2012    Procedure: MINOR STEROID INJECTION;  Surgeon: Lowella Grip, MD;  Location: [facility];  Service: Orthopedics;  Laterality: Right;  Epidural Steroid Injection  Right Lumbar 3-4 and Right Lumbar 5 - Sacral 1  . Gallbladder surgery    . Back surgery      two, last one 06/2011  . Esophagogastroduodenoscopy  04/2009    Dr. Jearld Shines 8mg IV/Demerol 50mg IV. erosive reflux esophagitis with single patch of salmon colored mucosa at distal esophagus. bx-no definite Barrett's  . Colonoscopy  04/2009    Dr. Hulda Humphrey diverticulum at ascending colon  . Colonoscopy with esophagogastroduodenoscopy (egd)  08/18/2012    Dr. Gala Romney: TCS with few colonic diverticulosis, , EGD with short segment biopsy-proven Barrett's, gastric and bulbar erosions. Negative H.pylori. Low-grade dysplasia of Barrett's.     Family History:  Family History  Problem Relation Age of Onset  . Colon cancer Other     uncle  . Pancreatic cancer Sister   . Pancreatic cancer Brother   . Diabetes Mother   . Heart disease Mother   . Kidney disease Mother   . Diabetes Other     9 siblings    Social History:  reports that he  quit smoking about 25 years ago. His smoking use included Cigarettes. He has a 20 pack-year smoking history. He has quit using smokeless tobacco. His smokeless tobacco use included Snuff. He reports that he does not drink alcohol or use illicit drugs.  Additional Social History:  Alcohol / Drug Use Pain Medications: SEE PTA, reports he takes Hydrocodone for back pain, but generallly takes less than prescribed, UDS negative Prescriptions: SEE PTA Over the Counter: SEE PTA History of alcohol / drug use?: No history of alcohol / drug abuse Longest period of sobriety (when/how long):  (NA) Negative Consequences of Use:  (NA) Withdrawal  Symptoms:  (NA)  CIWA: CIWA-Ar BP: 106/63 mmHg Pulse Rate: 60 COWS:    PATIENT STRENGTHS: (choose at least two) Communication skills Motivation for treatment/growth  Allergies: No Known Allergies  Home Medications:  (Not in a hospital admission)  OB/GYN Status:  No LMP for male patient.  General Assessment Data Location of Assessment: [facility] ED Is this a Tele or Face-to-Face Assessment?: Tele Assessment Is this an Initial Assessment or a Re-assessment for this encounter?: Initial Assessment Living Arrangements: Other (Comment) (currently staying with ex wife due to SI, live alone usually) Can pt return to current living arrangement?: Yes Admission Status: Voluntary Is patient capable of signing voluntary admission?: Yes Transfer from: Home Referral Source: Self/Family/Friend     [place] Living Arrangements: Other (Comment) (currently staying with ex wife due to [place], live alone usually) Name of Psychiatrist: none Name of Therapist: none  Education Status Is patient currently in school?: No Current Grade: NA Highest grade of school patient has completed: 12 Name of school: NA Contact person: NA  Risk to self with the past 6 months Suicidal Ideation: Yes-Currently Present Suicidal Intent: No-Not Currently/Within Last 6 Months Is patient at risk for suicide?: Yes Suicidal Plan?: Yes-Currently Present Specify Current Suicidal Plan: shoot himself Access to Means: Yes Specify Access to Suicidal Means: pt has guns and is active in a gun club What has been your use of drugs/alcohol within the last 12 months?: none, uses chewing tobacco Previous Attempts/Gestures: Yes How many times?: 1 (put gun in mouth a month ago) Other Self Harm Risks: none Triggers for Past Attempts: Other (Comment) (divorce) Intentional Self Injurious Behavior: None Family Suicide History: Yes (sister) Recent stressful life event(s): Divorce;Financial Problems Persecutory  voices/beliefs?: No Depression: Yes Depression Symptoms: Despondent;Tearfulness;Isolating;Fatigue;Loss of interest in usual pleasures;Feeling worthless/self pity;Feeling angry/irritable;Guilt Substance abuse history and/or treatment for substance abuse?: No Suicide prevention information given to non-admitted patients:  (being admitted)  Risk to Others within the past 6 months Homicidal Ideation: No Thoughts of Harm to Others: No Current Homicidal Intent: No Current Homicidal Plan: No Access to Homicidal Means: No Identified Victim: none History of harm to others?: No Assessment of Violence: None Noted Violent Behavior Description: none Does patient have access to weapons?: Yes (Comment) Criminal Charges Pending?: No Does patient have a court date: No  Psychosis Hallucinations: None noted Delusions: None noted  Mental Status Report Appear/Hygiene: Unremarkable Eye Contact: Good Motor Activity: Unremarkable Speech: Logical/coherent Level of Consciousness: Alert Mood: Depressed Affect: Appropriate to circumstance Anxiety Level: Moderate Thought Processes: Coherent;Relevant Judgement: Partial Orientation: Person;Place;Time;Situation Obsessive Compulsive Thoughts/Behaviors: None  Cognitive Functioning Concentration: Decreased Memory: Recent Intact;Remote Intact IQ: Average Insight: Fair Impulse Control: Fair Appetite: Poor Weight Loss:  (unsure) Weight Gain: 0 Sleep: No Change Total Hours of Sleep: 8 Vegetative Symptoms: None  ADLScreening [facility]) Patient's cognitive ability adequate to safely complete daily activities?:  Yes Patient able to express need for assistance with ADLs?: Yes Independently performs ADLs?: Yes (appropriate for developmental age)  Prior Inpatient Therapy Prior Inpatient Therapy: No Prior Therapy Dates: NA Prior Therapy Facilty/Provider(s): NA Reason for Treatment: NA  Prior Outpatient Therapy Prior Outpatient Therapy:  No Prior Therapy Dates: NA Prior Therapy Facilty/Provider(s): NA Reason for Treatment: NA  ADL Screening (condition at time of admission) Patient's cognitive ability adequate to safely complete daily activities?: Yes Is the patient deaf or have difficulty hearing?: No Does the patient have difficulty seeing, even when wearing glasses/contacts?: No Does the patient have difficulty concentrating, remembering, or making decisions?: No Patient able to express need for assistance with ADLs?: Yes Does the patient have difficulty dressing or bathing?: No Independently performs ADLs?: Yes (appropriate for developmental age) Does the patient have difficulty walking or climbing stairs?: No Weakness of Legs: None Weakness of Arms/Hands: None  Home Assistive Devices/Equipment Home Assistive Devices/Equipment: None    Abuse/Neglect Assessment (Assessment to be complete while patient is alone) Physical Abuse: Denies Verbal Abuse: Denies Sexual Abuse: Denies Exploitation of patient/patient's resources: Denies Self-Neglect: Denies Values / Beliefs Cultural Requests During Hospitalization: None Spiritual Requests During Hospitalization: None   Advance Directives (For Healthcare) Does patient have an advance directive?: No Would patient like information on creating an advanced directive?: No - patient declined information Nutrition Screen- [facility] Adult/[facility]/AP Patient's home diet: Regular  Additional Information 1:1 In Past 12 Months?: No CIRT Risk: No Elopement Risk: No Does patient have medical clearance?: Yes     Disposition:  Per Patriciaann Clan, PA pt meets inpt criteria and can be considered for [facility] placement. Per Luretha Murphy no current [facility] beds available. TTS to seek placement.   Lear Ng, [facility] Triage Specialist 07/06/2014 1:25 AM

## 2014-07-06 NOTE — BHH Counselor (Signed)
Per [facility] at [facility], pt has been accepted to [facility] bed 504-1. Writer notified pt's RN Levada Dy.   Arnold Long, [place] Assessment Counselor

## 2014-07-06 NOTE — Discharge Instructions (Signed)
Hyperglycemia °Hyperglycemia occurs when the glucose (sugar) in your blood is too high. Hyperglycemia can happen for many reasons, but it most often happens to people who do not know they have diabetes or are not managing their diabetes properly.  °CAUSES  °Whether you have diabetes or not, there are other causes of hyperglycemia. Hyperglycemia can occur when you have diabetes, but it can also occur in other situations that you might not be as aware of, such as: °Diabetes °· If you have diabetes and are having problems controlling your blood glucose, hyperglycemia could occur because of some of the following reasons: °¨ Not following your meal plan. °¨ Not taking your diabetes medications or not taking it properly. °¨ Exercising less or doing less activity than you normally do. °¨ Being sick. °Pre-diabetes °· This cannot be ignored. Before people develop Type 2 diabetes, they almost always have "pre-diabetes." This is when your blood glucose levels are higher than normal, but not yet high enough to be diagnosed as diabetes. Research has shown that some long-term damage to the body, especially the heart and circulatory system, may already be occurring during pre-diabetes. If you take action to manage your blood glucose when you have pre-diabetes, you may delay or prevent Type 2 diabetes from developing. °Stress °· If you have diabetes, you may be "diet" controlled or on oral medications or insulin to control your diabetes. However, you may find that your blood glucose is higher than usual in the hospital whether you have diabetes or not. This is often referred to as "stress hyperglycemia." Stress can elevate your blood glucose. This happens because of hormones put out by the body during times of stress. If stress has been the cause of your high blood glucose, it can be followed regularly by your caregiver. That way he/she can make sure your hyperglycemia does not continue to get worse or progress to  diabetes. °Steroids °· Steroids are medications that act on the infection fighting system (immune system) to block inflammation or infection. One side effect can be a rise in blood glucose. Most people can produce enough extra insulin to allow for this rise, but for those who cannot, steroids make blood glucose levels go even higher. It is not unusual for steroid treatments to "uncover" diabetes that is developing. It is not always possible to determine if the hyperglycemia will go away after the steroids are stopped. A special blood test called an A1c is sometimes done to determine if your blood glucose was elevated before the steroids were started. °SYMPTOMS °· Thirsty. °· Frequent urination. °· Dry mouth. °· Blurred vision. °· Tired or fatigue. °· Weakness. °· Sleepy. °· Tingling in feet or leg. °DIAGNOSIS  °Diagnosis is made by monitoring blood glucose in one or all of the following ways: °· A1c test. This is a chemical found in your blood. °· Fingerstick blood glucose monitoring. °· Laboratory results. °TREATMENT  °First, knowing the cause of the hyperglycemia is important before the hyperglycemia can be treated. Treatment may include, but is not be limited to: °· Education. °· Change or adjustment in medications. °· Change or adjustment in meal plan. °· Treatment for an illness, infection, etc. °· More frequent blood glucose monitoring. °· Change in exercise plan. °· Decreasing or stopping steroids. °· Lifestyle changes. °HOME CARE INSTRUCTIONS  °· Test your blood glucose as directed. °· Exercise regularly. Your caregiver will give you instructions about exercise. Pre-diabetes or diabetes which comes on with stress is helped by exercising. °· Eat wholesome,   balanced meals. Eat often and at regular, fixed times. Your caregiver or nutritionist will give you a meal plan to guide your sugar intake.  Being at an ideal weight is important. If needed, losing as little as 10 to 15 pounds may help improve blood  glucose levels. SEEK MEDICAL CARE IF:   You have questions about medicine, activity, or diet.  You continue to have symptoms (problems such as increased thirst, urination, or weight gain). SEEK IMMEDIATE MEDICAL CARE IF:   You are vomiting or have diarrhea.  Your breath smells fruity.  You are breathing faster or slower.  You are very sleepy or incoherent.  You have numbness, tingling, or pain in your feet or hands.  You have chest pain.  Your symptoms get worse even though you have been following your caregiver's orders.  If you have any other questions or concerns. Document Released: 02/18/2001 Document Revised: 11/17/2011 Document Reviewed: 12/22/2011 [facility] Patient Information 2015 [place], [place]. This information is not intended to replace advice given to you by your health care provider. Make sure you discuss any questions you have with your health care provider.  Orthostatic Hypotension Orthostatic hypotension is a sudden drop in blood pressure. It happens when you quickly stand up from a seated or lying position. You may feel dizzy or light-headed. This can last for just a few seconds or for up to a few minutes. It is usually not a serious problem. However, if this happens frequently or gets worse, it can be a sign of something more serious. CAUSES  Different things can cause orthostatic hypotension, including:   Loss of body fluids (dehydration).  Medicines that lower blood pressure.  Sudden changes in posture, such as standing up quickly after you have been sitting or lying down.  Taking too much of your medicine. SIGNS AND SYMPTOMS   Light-headedness or dizziness.   Fainting or near-fainting.   A fast heart rate.   Weakness.   Feeling tired (fatigue).  DIAGNOSIS  Your health care provider may do several things to help diagnose your condition and identify the cause. These may include:   Taking a medical history and doing a physical  exam.  Checking your blood pressure. Your health care provider will check your blood pressure when you are:  Lying down.  Sitting.  Standing.  Using tilt table testing. In this test, you lie down on a table that moves from a lying position to a standing position. You will be strapped onto the table. This test monitors your blood pressure and heart rate when you are in different positions. TREATMENT  Treatment will vary depending on the cause. Possible treatments include:   Changing the dosage of your medicines.  Wearing compression stockings on your lower legs.  Standing up slowly after sitting or lying down.  Eating more salt.  Eating frequent, small meals.  In some cases, getting IV fluids.  Taking medicine to enhance fluid retention. HOME CARE INSTRUCTIONS  Only take over-the-counter or prescription medicines as directed by your health care provider.  Follow your health care provider's instructions for changing the dosage of your current medicines.  Do not stop or adjust your medicine on your own.  Stand up slowly after sitting or lying down. This allows your body to adjust to the different position.  Wear compression stockings as directed.  Eat extra salt as directed.  Do not add extra salt to your diet unless directed to by your health care provider.  Eat frequent, small meals.  Avoid standing suddenly after eating.  Avoid hot showers or excessive heat as directed by your health care provider.  Keep all follow-up appointments. SEEK MEDICAL CARE IF:  You continue to feel dizzy or light-headed after standing.  You feel groggy or confused.  You feel cold, clammy, or sick to your stomach (nauseous).  You have blurred vision.  You feel short of breath. SEEK IMMEDIATE MEDICAL CARE IF:   You faint after standing.  You have chest pain.  You have difficulty breathing.   You lose feeling or movement in your arms or legs.   You have slurred speech  or difficulty talking, or you are unable to talk.  MAKE SURE YOU:   Understand these instructions.  Will watch your condition.  Will get help right away if you are not doing well or get worse. Document Released: 08/15/2002 Document Revised: 08/30/2013 Document Reviewed: 06/17/2013 [facility] Patient Information 2015 [place], [place]. This information is not intended to replace advice given to you by your health care provider. Make sure you discuss any questions you have with your health care provider.

## 2014-07-06 NOTE — H&P (Signed)
Psychiatric Admission Assessment Adult  Patient Identification:  William Schroeder Date of Evaluation:  07/06/2014 Chief Complaint: "I am depressed and had thoughts about shooting myself".  History of Present Illness::William Schroeder is an 53 y.o. Caucasian male who presented  To [facility] with suicidal thoughts with plan to shoot self with his gun, having written suicide notes to his ex-wife and children.   Patient seen this AM ,reports severe depression since the past few weeks,worsening since the past few days. Pt reports psychosocial stressor of his wife and him getting a divorce in June 2015. They were married for >35 years. Patient reports he does not know what happened ,but they just grew apart. Patient reports that his ex wife currently has been staying with him since the past 3 weeks, since he asked her to. He wants them to be together again ,however she has been seeing someone on [place] and its unlikely that they will get together again.  Pt endorses sadness, anhedonia,sleep issues,being withdrawn,hopelessness as well as fatigue and SI . Patient reports trying shoot self in the past ,and also per ED notes has been writing suicide notes to his ex wife and children.  Pt denies a past hx of depression or any other mental illness. Pt denies past hospitalizations in mental illness facilities.  Pt denies past hx of substance abuse, except for the use of tobacco.  Pt has diabetes which is  brittle and hard to control. His last A1c was 14. Patient  never checks his blood sugar but states he is compliant with his insulin.   Elements:  Location:  depression. Quality:  sadness,crying spells,SI ,several times tried to shoot self,has been writing suicide notes to family ,sleep issues,appetite loss,fatigue. Severity:  severe. Timing:  constant. Duration:  past 1 month and worsening. Context:  divorce with wife past june. Associated Signs/Synptoms: Depression Symptoms:  depressed  mood, anhedonia, insomnia, psychomotor retardation, fatigue, feelings of worthlessness/guilt, difficulty concentrating, hopelessness, recurrent thoughts of death, suicidal thoughts with specific plan, anxiety, decreased appetite, (Hypo) Manic Symptoms:  Impulsivity, Anxiety Symptoms:  denies Psychotic Symptoms:denies PTSD Symptoms: Negative Total Time spent with patient: 1 hour  Psychiatric Specialty Exam: Physical Exam  Constitutional: He is oriented to person, place, and time. He appears well-developed and well-nourished.  HENT:  Head: Normocephalic and atraumatic.  Eyes: Conjunctivae and EOM are normal. Pupils are equal, round, and reactive to light.  Neck: Normal range of motion. Neck supple. No tracheal deviation present. No thyromegaly present.  Cardiovascular: Normal rate, regular rhythm and normal heart sounds.   No murmur heard. Respiratory: Effort normal and breath sounds normal.  GI: Soft. He exhibits distension.  Musculoskeletal: Normal range of motion.  Neurological: He is alert and oriented to person, place, and time.  Skin: Skin is warm.  Psychiatric: His speech is normal. His mood appears anxious. His affect is blunt. He is withdrawn. Cognition and memory are normal. He expresses impulsivity. He exhibits a depressed mood. He expresses suicidal ideation.    Review of Systems  Constitutional: Negative.   HENT: Negative.   Eyes: Negative.   Respiratory: Negative.   Cardiovascular: Negative.   Gastrointestinal: Negative.   Genitourinary: Negative.   Musculoskeletal: Positive for back pain and myalgias.  Skin: Negative.   Neurological: Negative.   Psychiatric/Behavioral: Positive for depression and suicidal ideas. The patient is nervous/anxious and has insomnia.     Blood pressure 125/68, pulse 79, temperature 97.7 F (36.5 C), temperature source Oral, resp. rate 18, height 5\' 9"  (1.753 m),  weight 92.534 kg (204 lb).Body mass index is 30.11 kg/(m^2).   General Appearance: Casual  Eye Contact::  Good  Speech:  Clear and Coherent  Volume:  Normal  Mood:  Depressed  Affect:  Blunt  Thought Process:  Coherent  Orientation:  Full (Time, Place, and Person)  Thought Content:  Rumination  Suicidal Thoughts:  Yes.  with intent/plan  Homicidal Thoughts:  No  Memory:  Immediate;   Fair Recent;   Fair Remote;   Fair  Judgement:  Impaired  Insight:  Lacking  Psychomotor Activity:  Normal  Concentration:  Fair  Recall:  [employer] of Knowledge:Good  Language: Good  Akathisia:  No  Handed:  Right  AIMS (if indicated):     Assets:  [employer]ational  Sleep:       Musculoskeletal: Strength & Muscle Tone: within normal limits Gait & Station: normal Patient leans: N/A  Past Psychiatric History: Diagnosis:denies past hx of mental illness  Hospitalizations:denies  Outpatient Care:denies  Substance Abuse Care:denies  Self-Mutilation:denies  Suicidal Attempts:yes ,tried to shoot self  Violent Behaviors:denies   Past Medical History:   Past Medical History  Diagnosis Date  . Hypertension   . Diabetes mellitus   . GERD (gastroesophageal reflux disease)     not on Nexium for years  . Arthritis   . Chronic leg pain     since last back surgery  . Hyperlipidemia   . Gastroparesis    None. Allergies:  No Known Allergies PTA Medications: Prescriptions prior to admission  Medication Sig Dispense Refill  . esomeprazole (NEXIUM) 20 MG capsule 1 po qd      . metFORMIN (GLUCOPHAGE) 1000 MG tablet 1 po bid      . pioglitazone (ACTOS) 15 MG tablet 1 po q day      . ramipril (ALTACE) 5 MG capsule one po q day      . gabapentin (NEURONTIN) 300 MG capsule Take 600 mg by mouth daily.       . insulin glargine (LANTUS) 100 UNIT/ML injection Inject 50 Units into the skin at bedtime.       . insulin lispro (HUMALOG) 100 UNIT/ML injection Inject 14 Units into  the skin 3 (three) times daily before meals.      Marland Kitchen lisinopril (PRINIVIL,ZESTRIL) 20 MG tablet Take 20 mg by mouth daily.      Marland Kitchen oxyCODONE (ROXICODONE) 15 MG immediate release tablet Take 15 mg by mouth 3 (three) times daily as needed for pain.      . simvastatin (ZOCOR) 40 MG tablet Take 40 mg by mouth daily.        Previous Psychotropic Medications:  Medication/Dose  none               Substance Abuse History in the last 12 months:  No.  Consequences of Substance Abuse: NA  Social History:  reports that he quit smoking about 25 years ago. His smoking use included Cigarettes. He has a 20 pack-year smoking history. He has quit using smokeless tobacco. His smokeless tobacco use included Snuff. He reports that he does not drink alcohol or use illicit drugs. Additional Social History:                      Current Place of Residence: [place]  Place of Birth:  [place] adult children,ex wife still lives with him. 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 Legal History:denies current ,but has gone to jail when he used to be in [employer] for "fights". Hobbies/Interests:denies  Family History:   Family History  Problem Relation Age of Onset  . Colon cancer Other     uncle  . Pancreatic cancer Sister   . Pancreatic cancer Brother   . Diabetes Mother   . Heart disease Mother   . Kidney disease Mother   . Diabetes Other     9 siblings    Results for orders placed during the hospital encounter of 07/06/14 (from the past 72 hour(s))  GLUCOSE, CAPILLARY     Status: Abnormal   Collection Time    07/06/14 11:51 AM      Result Value Ref Range    Glucose-Capillary 249 (*) 70 - 99 mg/dL   Psychological Evaluations:denies  Assessment:  Patient is a 24 y old CM ,with no past hx of psychiatric diagnosis ,presents with SI ,with recent attempt to shoot self and multiple suicide notes to his ex wife and children. His major psychosocial stressor is his recent divorce with his ex wife and the fact that she has started seeing some one else.    DSM5: Primary Psychiatric Diagnosis: Major depressive disorder,single episode ,severe without psychosis  Secondary psychiatric diagnosis: Tobacco use disorder   Non Psychiatric Diagnosis: See PMH  Past Medical History  Diagnosis Date  . Hypertension   . Diabetes mellitus   . GERD (gastroesophageal reflux disease)     not on Nexium for years  . Arthritis   . Chronic leg pain     since last back surgery  . Hyperlipidemia   . Gastroparesis    Treatment Plan/Recommendations:  Patient will benefit from inpatient treatment and stabilization.  Estimated length of stay is 5-7 days.    Will start a trial of Zoloft 25 mg po daily. Discussed risks,benefits of medication. Will add Trazodone 50 mg po qhs for sleep. Will add  Vistaril prn for anxiety sx.  Patient has chronic pain issues and is on pain medications .Will restart home medications as needed. Diabetic consult for uncontrolled DM.  Will restart Lantus as well as restart SSI.   Will continue to monitor vitals ,medication compliance and treatment side effects while patient is here.  Will monitor for medical issues as well as call consult as needed.  Reviewed labs ,will order as needed.  CSW will start working on disposition.  Patient to participate in therapeutic milieu .       Treatment Plan Summary: Daily contact with patient to assess and evaluate symptoms and progress in treatment Medication management Current Medications:  Current Facility-Administered Medications  Medication Dose Route Frequency Provider Last Rate Last  Dose  . acetaminophen (TYLENOL) tablet 650 mg  650 mg Oral Q6H PRN Ursula Alert, MD      . alum & mag hydroxide-simeth (MAALOX/MYLANTA) 200-200-20 MG/5ML suspension 30 mL  30 mL Oral Q4H PRN Amberlie Gaillard, MD      . feeding supplement (GLUCERNA SHAKE) (GLUCERNA SHAKE) liquid 237 mL  237 mL Oral TID BM Madhuri Vacca, MD   237 mL at 07/06/14 1518  . hydrocerin (EUCERIN) cream   Topical BID Ursula Alert, MD      . hydrOXYzine (ATARAX/VISTARIL) tablet 25 mg  25 mg Oral TID PRN Ursula Alert, MD      . [  START ON 07/07/2014] Influenza vac split quadrivalent PF (FLUARIX) injection 0.5 mL  0.5 mL Intramuscular Tomorrow-1000 Jemmie Ledgerwood, MD      . insulin aspart (novoLOG) injection 0-15 Units  0-15 Units Subcutaneous TID WC Alajiah Dutkiewicz, MD      . insulin glargine (LANTUS) injection 50 Units  50 Units Subcutaneous QHS Joydan Gretzinger, MD      . magnesium hydroxide (MILK OF MAGNESIA) suspension 30 mL  30 mL Oral Daily PRN Ursula Alert, MD      . MUSCLE RUB CREA   Topical PRN Ursula Alert, MD      . nicotine polacrilex (NICORETTE) gum 2 mg  2 mg Oral PRN Ursula Alert, MD   2 mg at 07/06/14 1203  . oxyCODONE (Oxy IR/ROXICODONE) immediate release tablet 15 mg  15 mg Oral Q8H PRN Ursula Alert, MD   15 mg at 07/06/14 1518  . [START ON 07/07/2014] pneumococcal 23 valent vaccine (PNU-IMMUNE) injection 0.5 mL  0.5 mL Intramuscular Tomorrow-1000 Benn Tarver, MD      . sertraline (ZOLOFT) tablet 25 mg  25 mg Oral Daily Urho Rio, MD   25 mg at 07/06/14 1511  . traZODone (DESYREL) tablet 50 mg  50 mg Oral QHS Ursula Alert, MD        Observation Level/Precautions:  15 minute checks  Laboratory:  tsh,hba1c,lipid panel,ekg if not already done  Psychotherapy:  Group and individual therapy  Medications:  See above  Consultations:  Dietician as well as diabetic consult  Discharge Concerns:  Stability and safety       I certify that inpatient services furnished can reasonably be expected  to improve the patient's condition.   Thayden Lemire MD 10/29/20153:52 PM

## 2014-07-06 NOTE — BH Assessment (Signed)
Reviewed ED notes prior to assessment. Pt is medically cleared for assessment but does struggles with hard to manage diabetes. Pt reports SI since divorce, is active in gun club, and has written suicide notes.   Requested cart be placed with pt for assessment.   Assessment to begin shortly.    Lear Ng, [facility] Triage Specialist 07/06/2014 12:38 AM

## 2014-07-06 NOTE — BH Assessment (Signed)
Relayed results of assessment to Patriciaann Clan, [place]. Per Frederico Hamman PA pt meets inpt criteria. Per Luretha Murphy no [facility] beds currently available, but possible discharges tomorrow. TTS to seek placement, but pt can be considered for [facility] once available bed.   Attempted to relayed recommendations to EDP, Monico Blitz, PA-C, but her shift just ended.   Informed RN of plans, she will inform pt.    Lear Ng, [facility] Triage Specialist 07/06/2014 1:12 AM

## 2014-07-06 NOTE — Progress Notes (Signed)
Inpatient Diabetes Program Recommendations  AACE/ADA: New Consensus Statement on Inpatient Glycemic Control (2013)  Target Ranges:  Prepandial:   less than 140 mg/dL      Peak postprandial:   less than 180 mg/dL (1-2 hours)      Critically ill patients:  140 - 180 mg/dL     Results for William Schroeder, William Schroeder (MRN [id number]) as of 07/06/2014 16:19  Ref. Range 07/06/2014 07:16 07/06/2014 11:51  Glucose-Capillary Latest Range: 70-99 mg/dL 205 (H) 249 (H)     Received page from Dr. Shea Evans about this patient.  Dr. Shea Evans wanted to know if it would be OK to increase patient's Lantus dose.  In reviewing patient's CBGs today, note fasting glucose is elevated despite patient receiving Lantus 50 units last PM.  Recommended to Dr. Shea Evans to increase patient's Lantus to 60 units QHS (start tonight).  Per Dr. Shea Evans, patient not eating well just yet and likely not candidate for Meal Coverage yet.    Will follow Wyn Quaker RN, MSN, CDE Diabetes Coordinator Inpatient Diabetes Program Team Pager: [phone] (8a-10p)

## 2014-07-06 NOTE — Progress Notes (Signed)
Patient ID: William Schroeder, male   DOB: Mar 04, 1961, 53 y.o.   MRN: [id number] Report called to Hiram Comber, RN in the triage area of [facility]. Pt given a cup of water and is sitting in the dayroom. Awaiting transport. Elenore Rota, RN

## 2014-07-06 NOTE — ED Notes (Signed)
Patient transported to X-ray 

## 2014-07-06 NOTE — BHH Counselor (Signed)
Adult Comprehensive Assessment  Patient ID: William Schroeder, male   DOB: 18-Mar-1961, 53 y.o.   MRN: [id number]  Information Source: Information source: Patient  Current Stressors:  Physical health (include injuries & life threatening diseases): diabetes. two spinal fusions-chronic back pain managed by oxycodone. pt open to alternatives to this medication, as he reports that it changes his demeanor.  Bereavement / Loss: none identified.   Living/Environment/Situation:  Living Arrangements: Spouse/significant other Living conditions (as described by patient or guardian): lives in home with wife with whom he is separated for past 3 weeks.  How long has patient lived in current situation?: 3 weeks. Separated in June 2015 and was living alone until about three weeks ago.  What is atmosphere in current home: Comfortable  Family History:  Marital status: Separated Separated, when?: June 2015 What types of issues is patient dealing with in the relationship?: "we just grew apart."  Additional relationship information: "I want to get back together with her and work things out. I'm not sure if that's what she wants."  Does patient have children?: Yes How many children?: 2 How is patient's relationship with their children?: 35 year old son-lives in [place], [place]; 61 year old daughter-lives in [place], [place]  Childhood History:  By whom was/is the patient raised?: Both parents Additional childhood history information: loving "perfect" childhood. My parents both raised me and were wonderful parents Description of patient's relationship with caregiver when they were a child: close to mother and father but closer to mom. "dad was a Ecologist."  Patient's description of current relationship with people who raised him/her: deceased.  Does patient have siblings?: Yes Number of Siblings: 7 Description of patient's current relationship with siblings: four siblings are deceased. close "to a few others."   Did patient suffer any verbal/emotional/physical/sexual abuse as a child?: No Did patient suffer from severe childhood neglect?: No Has patient ever been sexually abused/assaulted/raped as an adolescent or adult?: No Was the patient ever a victim of a crime or a disaster?: No Witnessed domestic violence?: No Has patient been effected by domestic violence as an adult?: No  Education:  Highest grade of school patient has completed: High school graduate.  Currently a student?: No Learning disability?: No  Employment/Work Situation:   Employment situation: Employed Where is patient currently employed?: works as IT trainer at [employer].  How long has patient been employed?: 30 years Patient's job has been impacted by current illness: No What is the longest time patient has a held a job?: see above Where was the patient employed at that time?: see above Has patient ever been in the [employer]?: Yes (Describe in comment) Has patient ever served in combat?: Yes Patient description of combat service: one year in [place]   Financial Resources:   Financial resources: Income from [employer] insurance Does patient have a representative payee or guardian?: No  Alcohol/Substance Abuse:   What has been your use of drugs/alcohol within the last 12 months?: none -chewing tobacco  If attempted suicide, did drugs/alcohol play a role in this?: No (pt has not used drugs/alcohol in over 20 years. pt has oxycodone prescribed for pain managment and reports that he does not abuse pain pills) Alcohol/Substance Abuse Treatment Hx: Denies past history If yes, describe treatment: n/a  Has alcohol/substance abuse ever caused legal problems?: No  Social Support System:   Patient's Community Support System: Good Describe Community Support System: pt reports good friends and good friends at work.  Type of faith/religion: christian  How does patient's faith help to cope with current illness?: n/a    Leisure/Recreation:   Leisure and Hobbies: anything outdoors.   Strengths/Needs:   What things does the patient do well?: I'm a hard worker.  In what areas does patient struggle / problems for patient: depression, thoughts of SI. "I put a gun in my mouth last month but chickened out."   Discharge Plan:   Does patient have access to transportation?: Yes (car and license) Will patient be returning to same living situation after discharge?: Yes (home with wife ) Currently receiving community mental health services: No If no, would patient like referral for services when discharged?: Yes (What county?) ([facility] outpatient for med managment) Does patient have financial barriers related to discharge medications?: No  Summary/Recommendations:    Pt is 53 year old male living in [place], [place] with his wife, with whom he is recently divorced. Pt presents to [facility] due to SI and depression. Pt reports that his primary stressor is his divorce that was finalized in June 2015. Pt reports that he is unable to move on and wants to make things work with his ex-wife. He moved back into the home 3 weeks ago but reports frequent arguments. Pt denies current SI/HI/AVH and denies substance abuse. Pt reports frequent thoughts of SI and put gun to head last month, but "chickened out."  Pt is prescribed oxycodone to manage chronic back pain (2 spinal fusions) and reports that he takes this as prescribed. Pt stated that he is open to alternative methods of pain management, being that his wife thinks his mood changes when he takes oxycodone. Pt reports no past mental health service-inpatient or outpatient. Recommendations for pt include: crisis stabilization, therapeutic milieu, encourage group attendance and participation, medication management for mood stabilization, and development of comprehensive mental wellness plan. He is open to med management at [facility] for med management. CSW assessing.   [place], [place] LCSWA  07/06/2014

## 2014-07-06 NOTE — Progress Notes (Signed)
Patient ID: William Schroeder, male   DOB: March 21, 1961, 53 y.o.   MRN: [id number]  D: Pt has a flat affect, is tearful at times and has a depressed mood. Pt reports to Probation officer that he "has been depressed since he got divorced from my wife." Pt states that the has attempted suicide by "putting a gun in my mouth." Pt also reports that he has the means to carry out his plan at home. Pt requested Nicotine gum. Pt complains of chronic back and leg pain and states that it's a "stabbing" pain when his "sciatic starts hurting."   A: Pt encouraged and emotionally supported. Pt given medications per MD orders. Pt given 2mg  Nicotine gum. Pt given PRN medication.   R: Pt attended groups today. Pt remains safe with 15 minute checks. Pt asked for pain medicine and saying that he typically takes one three times a day. Pt gets red in the face and starts shaking when told about the new pain medicine schedule but verbalizes that he understands the new schedule.   Elenore Rota, RN

## 2014-07-06 NOTE — Progress Notes (Signed)
Patient ID: William Schroeder, male   DOB: Apr 18, 1961, 53 y.o.   MRN: [id number] Nursing admission note:  William Schroeder is a 53 yo male admitted to [facility] for depression and suicidal ideation.  Patient had written a suicide note to his ex-wife and children with a plan to shoot himself.  Patient has a family hx of substance abuse, mental health and completed suicide.  Last month he put a gun in his mouth.  Wife has since removed all firearms from the home.  Patient states that when his divorce was finalized in June of this year, he began to experience severe depressive symptoms.  Patient did not agree to the divorce and he is currently residing with his ex.  He states, "I hope to continue to stay with her.  This is not what I wanted.  He reports his symptoms as crying spells, hopelessness, loss of pleasure.  Patient denies any HI/AVH.  He chews tobacco and nicorette gum was ordered.  He has a hx of gastroparesis and IDDM.  He has had two prior back surgeries and has weakness in both legs at times.  Also reports HTN and arthritis.  Patient was oriented to room and unit.

## 2014-07-06 NOTE — ED Notes (Signed)
Per EMS pt comes from [facility] for hyperglycemia and orthostatic hypotension.  Pt CBG @ 1640 was 506 pt was given 15units of Novolog. CBG retaken at 1705 and was 420. Pt BP initially at [facility] arrival was 135/82. Later when taken while sitting was 94/54 and standing was 68/50.  EMS BP was 118/80 and CBG 354.

## 2014-07-06 NOTE — Tx Team (Signed)
  Interdisciplinary Treatment Plan Update   Date Reviewed:  07/06/2014  Time Reviewed:  11:23 AM  Progress in Treatment:   Attending groups: Yes Participating in groups: Yes Taking medication as prescribed: Yes  Tolerating medication: Yes Family/Significant other contact made: Not yet. SPE required. Pt signed consent to speak with ex wife.  Patient understands diagnosis: Yes  Discussing patient identified problems/goals with staff: Yes  See initial care plan Medical problems stabilized or resolved: Yes Denies suicidal/homicidal ideation: Passive SI; able to contract for safety on unit.  Patient has not harmed self or others: Yes  For review of initial/current patient goals, please see plan of care.  Estimated Length of Stay:  3-5 days   Reason for Continuation of Hospitalization: Depression Medication stabilization Suicidal ideation  New Problems/Goals identified:  N/A  Discharge Plan or Barriers:   Pt open to med management at [facility], CSW assessing.   Additional Comments:  William Schroeder is an 53 y.o. male presenting to ED with suicidal thoughts with planning, having written suicide notes to his ex-wife and children. Pt is alert and oriented times 4. Mood is depressed and anxious with congruent affect. Speech is logical and coherent, judgement partial. Pt denies HI, a/v hallucination, SA, self-harm, and abuse. Family hx is positive for substance abuse, mental health concerns and completed suicide.  Pt reports he has has mild depressive sx in the past, "Nothing major." Current depressive episode began when his divorce was finalized in June 2015 after 89 years of marriage. "It's not what I wanted." Reports he has been feeling suicidal. Last month he put a gun in his mouth, "I couldn't go through with it." Pt's ex wife now has his guns. Pt reports he has been staying with his ex wife for several weeks because he does not feel safe. Pt endorses the following sx of depression:  crying spells, hopelessness, loss of pleasure, loss of motivation, feeling guilty, and more irritable.   Attendees:  Signature: Steva Colder, MD 07/06/2014 11:23 AM   Signature: Ripley Fraise, LCSW 07/06/2014 11:23 AM  Signature: Elmarie Shiley, NP 07/06/2014 11:23 AM  Signature: Mayra Neer, RN 07/06/2014 11:23 AM  Signature: Darrol Angel, RN 07/06/2014 11:23 AM  Signature:  07/06/2014 11:23 AM  Signature:   07/06/2014 11:23 AM  Signature:    Signature:    Signature:    Signature:    Signature:    Signature:      Scribe for Treatment Team:   Ripley Fraise, Vincent  07/06/2014 11:23 AM  Pt and CSW reviewed pt's identified goals and treatment plan. Pt verbalized understanding and agreed to treatment plan.  [place], [place] 07/06/2014 2:00 PM

## 2014-07-06 NOTE — BHH Suicide Risk Assessment (Signed)
   Nursing information obtained from:    Demographic factors:    Current Mental Status:    Loss Factors:    Historical Factors:    Risk Reduction Factors:    Total Time spent with patient: 45 minutes  CLINICAL FACTORS:   Depression:   Anhedonia Hopelessness Impulsivity  Psychiatric Specialty Exam: Physical Exam Please see H&P.   ROS Please see H&P.   Blood pressure 125/68, pulse 79, temperature 97.7 F (36.5 C), temperature source Oral, resp. rate 18, height 5\' 9"  (1.753 m), weight 92.534 kg (204 lb).Body mass index is 30.11 kg/(m^2).  Please see H&P for MSE.     SUICIDE RISK:   Severe:  Frequent, intense, and enduring suicidal ideation, specific plan, no subjective intent, but some objective markers of intent (i.e., choice of lethal method), the method is accessible, some limited preparatory behavior, evidence of impaired self-control, severe dysphoria/symptomatology, multiple risk factors present, and few if any protective factors, particularly a lack of social support.  PLAN OF CARE:Please see H&P.   I certify that inpatient services furnished can reasonably be expected to improve the patient's condition.  Cady Hafen MD 07/06/2014, 3:24 PM

## 2014-07-06 NOTE — BHH Group Notes (Signed)
[facility] LCSW Group Therapy  07/06/2014 3:07 PM   Type of Therapy:  Group Therapy  Participation Level:  Active  Participation Quality:  Attentive  Affect:  Appropriate  Cognitive:  Appropriate  Insight:  Improving  Engagement in Therapy:  Engaged  Modes of Intervention:  Clarification, Education, Exploration and Socialization  Summary of Progress/Problems: Today's group focused on relapse prevention.  We defined the term, and then brainstormed on ways to prevent relapse. Tykwon joined [place] towards the end of group as he was with the Dr when group started.  "This is my first time at one of these places.  I'm not sure what the routine is.  I guess recovery is making sure you are associating with positive people."    Roque Lias B 07/06/2014 , 3:07 PM

## 2014-07-07 LAB — GLUCOSE, CAPILLARY
GLUCOSE-CAPILLARY: 266 mg/dL — AB (ref 70–99)
Glucose-Capillary: 183 mg/dL — ABNORMAL HIGH (ref 70–99)
Glucose-Capillary: 242 mg/dL — ABNORMAL HIGH (ref 70–99)
Glucose-Capillary: 294 mg/dL — ABNORMAL HIGH (ref 70–99)
Glucose-Capillary: 241 mg/dL — ABNORMAL HIGH (ref 70–99)

## 2014-07-07 LAB — HEMOGLOBIN A1C
Hgb A1c MFr Bld: 13.4 % — ABNORMAL HIGH (ref <5.7)
MEAN PLASMA GLUCOSE: 338 mg/dL — AB (ref <117)

## 2014-07-07 MED ORDER — LIDOCAINE 5 % EX PTCH
1.0000 | MEDICATED_PATCH | CUTANEOUS | Status: DC
  Administered 2014-07-07 – 2014-07-10 (×2): 1 via TRANSDERMAL
  Filled 2014-07-07 (×4): qty 1
  Filled 2014-07-07: qty 3
  Filled 2014-07-07 (×2): qty 1

## 2014-07-07 NOTE — BHH Suicide Risk Assessment (Signed)
William Schroeder INPATIENT:  Family/Significant Other Suicide Prevention Education  Suicide Prevention Education:  Education Completed; William Schroeder (pt's ex-wife) [phone] has been identified by the patient as the family member/significant other with whom the patient will be residing, and identified as the person(s) who will aid the patient in the event of a mental health crisis (suicidal ideations/suicide attempt).  With written consent from the patient, the family member/significant other has been provided the following suicide prevention education, prior to the and/or following the discharge of the patient.  The suicide prevention education provided includes the following:  Suicide risk factors  Suicide prevention and interventions  National Suicide Hotline telephone number  [facility] assessment telephone number  [facility] Emergency Assistance [place] and/or Residential Mobile Crisis Unit telephone number  Request made of family/significant other to:  Remove weapons (e.g., guns, rifles, knives), all items previously/currently identified as safety concern.    Remove drugs/medications (over-the-counter, prescriptions, illicit drugs), all items previously/currently identified as a safety concern.  The family member/significant other verbalizes understanding of the suicide prevention education information provided.  The family member/significant other agrees to remove the items of safety concern listed above.  Pt's ex-wife states that his guns have been locked and pt does not know combination. Pt's ex-wife concerned about pt's continuous threats to commit suicide if she leaves him and moves to [place] or dates someone else. SPI pamphlet and SPE information reviewed thoroughly with pt's ex-wife. SPE pamphlet also provided to pt and all crisis and non crisis numbers reviewed.   Smart, Arlee Bossard LCSWA  07/07/2014, 3:33 PM

## 2014-07-07 NOTE — BHH Group Notes (Signed)
[facility] LCSW Group Therapy  07/07/2014  1:05 PM  Type of Therapy:  Group therapy  Participation Level:  Active  Participation Quality:  Attentive  Affect:  Flat  Cognitive:  Oriented  Insight:  Limited  Engagement in Therapy:  Limited  Modes of Intervention:  Discussion, Socialization  Summary of Progress/Problems:  Chaplain was here to lead a group on themes of hope and courage.  "Caring is someone who loves you.  The two people who show me this most are my children.  They reached out to me because they were afraid that daddy was going to kill himself." [name] was attentive and engaged throughout group.     Roque Lias B 07/07/2014 1:29 PM

## 2014-07-07 NOTE — Progress Notes (Signed)
NUTRITION ASSESSMENT  Pt identified as at risk on the Malnutrition Screen Tool  INTERVENTION: 1. Educated patient on the importance of nutrition and encouraged intake of food and beverages.  Educated patient on basics of diabetic diet and meal planning.  Patient able to verbalize.  Written information provided to patient. 2.  Glucerna tid.  NUTRITION DIAGNOSIS: Unintentional weight loss related to sub-optimal intake as evidenced by pt report.   Goal: Pt to meet >/= 90% of their estimated nutrition needs.  Monitor:  PO intake  Assessment:  Patient admitted with Major Depression and SI.  HgbA1c of 13.4.  Patient reports that he has known that he has had diabetes for about 10 years but overall, has done nothing about it.  "They told me to take a shot but I don't have time."  Wishes that his diabetes would just go away.  Reports weighting 297 lbs 1 year ago.  This is not confirmed by e-chart.  Weight overall appears stable.  Intake is variable overall.  Skipped Breakfast this am but did drink a Glucerna.  Chose fruit punch for lunch along with dessert.  53 y.o. male  Height: Ht Readings from Last 1 Encounters:  07/06/14 5\' 9"  (1.753 m)    Weight: Wt Readings from Last 1 Encounters:  07/06/14 204 lb (92.534 kg)    Weight Hx: Wt Readings from Last 10 Encounters:  07/06/14 204 lb (92.534 kg)  07/05/14 205 lb (92.987 kg)  04/12/13 202 lb 3.2 oz (91.717 kg)  10/26/12 218 lb 12.8 oz (99.247 kg)  08/18/12 216 lb (97.977 kg)  08/18/12 216 lb (97.977 kg)  08/02/12 216 lb 6.4 oz (98.158 kg)    BMI:  Body mass index is 30.11 kg/(m^2). Pt meets criteria for obesity grade 1 based on current BMI.  Estimated Nutritional Needs: Kcal: 25-30 kcal/kg Protein: > 1 gram protein/kg Fluid: 1 ml/kcal  Diet Order: Carb Control Pt is also offered choice of unit snacks mid-morning and mid-afternoon.  Pt is eating as desired.   Lab results and medications reviewed.   Antonieta Iba, RD,  LDN Clinical Inpatient Dietitian Pager:  [phone] Weekend and after hours pager:  [phone]

## 2014-07-07 NOTE — Progress Notes (Signed)
[facility] MD Progress Note  07/07/2014 11:52 AM KADARRIUS YANKE  MRN:  [id number] Subjective: Pt states ," I am improving ,my depression is at 5 today". Objective: Patient seen and chart reviewed.pt reports he is improving ,but it is too soon to say if his medications are working for him or not since he was just started on Zoloft. Pt reports sleep as improving and he feels less anxious. Pt discussed his wife's concern about his being on pain medications. He reports not using even what he is prescribed and giving it away sometimes since he does not use it all. Pt reports never abusing pain medications or any other medications /drugs in the past. Pt would love to taper off pain medications and get off it at some point. But once his depression is more stable he is planning to go to [place] to get help. Pt today feels positive that his wife is willing to work on their relationship again. Patient's major concern now is how to live without his wife ,if she happens to leave again. Pt was too overwhelmed to talk about that subject. Pt currently denies SI/HI/AH/VH.   Per staff appetite is still low,but he is pleasant,compliant with therapy/milieu.   Diagnosis:   DSM5:  Primary Psychiatric Diagnosis:  Major depressive disorder,single episode ,severe without psychosis   Secondary psychiatric diagnosis:  Tobacco use disorder   Non Psychiatric Diagnosis:  See PMH       Total Time spent with patient: 30 minutes   ADL's:  Intact  Sleep: Fair  Appetite:  Poor   Psychiatric Specialty Exam: Physical Exam  ROS  Blood pressure 87/54, pulse 86, temperature 97.4 F (36.3 C), temperature source Oral, resp. rate 17, height 5' 9"  (1.753 m), weight 92.534 kg (204 lb), SpO2 94.00%.Body mass index is 30.11 kg/(m^2).  General Appearance: Casual  Eye Contact::  Fair  Speech:  Clear and Coherent  Volume:  Normal  Mood:  Anxious and Depressed  Affect:  Labile  Thought Process:  Coherent  Orientation:   Full (Time, Place, and Person)  Thought Content:  WDL  Suicidal Thoughts:  No  Homicidal Thoughts:  No  Memory:  Immediate;   Fair Recent;   Fair Remote;   Fair  Judgement:  Impaired  Insight:  Lacking  Psychomotor Activity:  Normal  Concentration:  Fair  Recall:  [employer] of Knowledge:Good  Language: Good  Akathisia:  No  Handed:  Right  AIMS (if indicated):     Assets:  Communication Skills Desire for Improvement Housing Social Support  Sleep:  Number of Hours: 4.25   Musculoskeletal: Strength & Muscle Tone: within normal limits Gait & Station: normal Patient leans: N/A  Current Medications: Current Facility-Administered Medications  Medication Dose Route Frequency Provider Last Rate Last Dose  . acetaminophen (TYLENOL) tablet 650 mg  650 mg Oral Q6H PRN Ursula Alert, MD      . alum & mag hydroxide-simeth (MAALOX/MYLANTA) 200-200-20 MG/5ML suspension 30 mL  30 mL Oral Q4H PRN Jumanah Hynson, MD      . feeding supplement (GLUCERNA SHAKE) (GLUCERNA SHAKE) liquid 237 mL  237 mL Oral TID BM Vaidehi Braddy, MD   237 mL at 07/07/14 1004  . hydrocerin (EUCERIN) cream   Topical BID Ursula Alert, MD      . hydrOXYzine (ATARAX/VISTARIL) tablet 25 mg  25 mg Oral TID PRN Ursula Alert, MD      . Influenza vac split quadrivalent PF (FLUARIX) injection 0.5 mL  0.5 mL  Intramuscular Tomorrow-1000 Dailee Manalang, MD      . insulin aspart (novoLOG) injection 0-15 Units  0-15 Units Subcutaneous TID WC Ursula Alert, MD   3 Units at 07/07/14 0650  . insulin glargine (LANTUS) injection 60 Units  60 Units Subcutaneous QHS Ursula Alert, MD   60 Units at 07/07/14 0013  . lidocaine (LIDODERM) 5 % 1 patch  1 patch Transdermal Q24H Dartanian Knaggs, MD      . lisinopril (PRINIVIL,ZESTRIL) tablet 20 mg  20 mg Oral Daily Ursula Alert, MD   20 mg at 07/07/14 0753  . magnesium hydroxide (MILK OF MAGNESIA) suspension 30 mL  30 mL Oral Daily PRN Ursula Alert, MD      . MUSCLE RUB CREA    Topical PRN Ursula Alert, MD      . nicotine polacrilex (NICORETTE) gum 2 mg  2 mg Oral PRN Ursula Alert, MD   2 mg at 07/07/14 0753  . oxyCODONE (Oxy IR/ROXICODONE) immediate release tablet 15 mg  15 mg Oral Q8H PRN Ursula Alert, MD   15 mg at 07/07/14 0756  . pneumococcal 23 valent vaccine (PNU-IMMUNE) injection 0.5 mL  0.5 mL Intramuscular Tomorrow-1000 Marlyss Cissell, MD      . sertraline (ZOLOFT) tablet 25 mg  25 mg Oral Q breakfast Ursula Alert, MD   25 mg at 07/07/14 0753  . simvastatin (ZOCOR) tablet 40 mg  40 mg Oral q1800 Ursula Alert, MD      . traZODone (DESYREL) tablet 50 mg  50 mg Oral QHS Ursula Alert, MD   50 mg at 07/07/14 0013    Lab Results:  Results for orders placed during the hospital encounter of 07/06/14 (from the past 48 hour(s))  GLUCOSE, CAPILLARY     Status: Abnormal   Collection Time    07/06/14 11:51 AM      Result Value Ref Range   Glucose-Capillary 249 (*) 70 - 99 mg/dL  GLUCOSE, CAPILLARY     Status: Abnormal   Collection Time    07/06/14  4:44 PM      Result Value Ref Range   Glucose-Capillary 506 (*) 70 - 99 mg/dL   Comment 1 Documented in Chart     Comment 2 Notify RN    GLUCOSE, CAPILLARY     Status: Abnormal   Collection Time    07/06/14  5:05 PM      Result Value Ref Range   Glucose-Capillary 420 (*) 70 - 99 mg/dL  CBG MONITORING, ED     Status: Abnormal   Collection Time    07/06/14  6:27 PM      Result Value Ref Range   Glucose-Capillary 293 (*) 70 - 99 mg/dL  URINALYSIS, ROUTINE W REFLEX MICROSCOPIC     Status: Abnormal   Collection Time    07/06/14  7:13 PM      Result Value Ref Range   Color, Urine YELLOW  YELLOW   APPearance CLEAR  CLEAR   Specific Gravity, Urine 1.036 (*) 1.005 - 1.030   pH 5.5  5.0 - 8.0   Glucose, UA >1000 (*) NEGATIVE mg/dL   Hgb urine dipstick NEGATIVE  NEGATIVE   Bilirubin Urine NEGATIVE  NEGATIVE   Ketones, ur NEGATIVE  NEGATIVE mg/dL   Protein, ur NEGATIVE  NEGATIVE mg/dL   Urobilinogen, UA  1.0  0.0 - 1.0 mg/dL   Nitrite NEGATIVE  NEGATIVE   Leukocytes, UA NEGATIVE  NEGATIVE  URINE MICROSCOPIC-ADD ON     Status: None  Collection Time    07/06/14  7:13 PM      Result Value Ref Range   Urine-Other       Value: NO FORMED ELEMENTS SEEN ON URINE MICROSCOPIC EXAMINATION  CBC     Status: None   Collection Time    07/06/14  7:37 PM      Result Value Ref Range   WBC 7.8  4.0 - 10.5 K/uL   RBC 4.81  4.22 - 5.81 MIL/uL   Hemoglobin 14.2  13.0 - 17.0 g/dL   HCT 40.6  39.0 - 52.0 %   MCV 84.4  78.0 - 100.0 fL   MCH 29.5  26.0 - 34.0 pg   MCHC 35.0  30.0 - 36.0 g/dL   RDW 12.9  11.5 - 15.5 %   Platelets 234  150 - 400 K/uL  COMPREHENSIVE METABOLIC PANEL     Status: Abnormal   Collection Time    07/06/14  7:37 PM      Result Value Ref Range   Sodium 139  137 - 147 mEq/L   Potassium 4.1  3.7 - 5.3 mEq/L   Chloride 101  96 - 112 mEq/L   CO2 28  19 - 32 mEq/L   Glucose, Bld 206 (*) 70 - 99 mg/dL   BUN 13  6 - 23 mg/dL   Creatinine, Ser 0.83  0.50 - 1.35 mg/dL   Calcium 10.0  8.4 - 10.5 mg/dL   Total Protein 7.1  6.0 - 8.3 g/dL   Albumin 3.3 (*) 3.5 - 5.2 g/dL   AST 16  0 - 37 U/L   ALT 22  0 - 53 U/L   Alkaline Phosphatase 109  39 - 117 U/L   Total Bilirubin 0.4  0.3 - 1.2 mg/dL   GFR calc non Af Amer >90  >90 mL/min   GFR calc Af Amer >90  >90 mL/min   Comment: (NOTE)     The eGFR has been calculated using the CKD EPI equation.     This calculation has not been validated in all clinical situations.     eGFR's persistently <90 mL/min signify possible Chronic Kidney     Disease.   Anion gap 10  5 - 15  HEMOGLOBIN A1C     Status: Abnormal   Collection Time    07/06/14  7:38 PM      Result Value Ref Range   Hemoglobin A1C 13.4 (*) <5.7 %   Comment: (NOTE)                                                                               According to the ADA Clinical Practice Recommendations for 2011, when     HbA1c is used as a screening test:      >=6.5%   Diagnostic of  Diabetes Mellitus               (if abnormal result is confirmed)     5.7-6.4%   Increased risk of developing Diabetes Mellitus     References:Diagnosis and Classification of Diabetes Mellitus,Diabetes     GGYI,9485,46(EVOJJ 1):S62-S69 and Standards of Medical Care in  Diabetes - 2011,Diabetes Care,2011,34 (Suppl 1):S11-S61.   Mean Plasma Glucose 338 (*) <117 mg/dL   Comment: Performed at [facility], [facility]     Status: Abnormal   Collection Time    07/06/14  7:38 PM      Result Value Ref Range   FIO2 0.21     Delivery systems ROOM AIR     pH, Ven 7.348 (*) 7.250 - 7.300   pCO2, Ven 51.1 (*) 45.0 - 50.0 mmHg   pO2, Ven BELOW REPORTABLE RANGE  30.0 - 45.0 mmHg   Comment: CRITICAL RESULT CALLED TO, READ BACK BY AND VERIFIED WITH:     BRANDY HARRIS, RN AT 2002 BY ANNALISSA AGUSTIN,RRT,RCP ON 07/06/14   Bicarbonate 27.5 (*) 20.0 - 24.0 mEq/L   TCO2 24.5  0 - 100 mmol/L   Acid-Base Excess 1.3  0.0 - 2.0 mmol/L   O2 Saturation 77.1     Patient temperature 98.1     Collection site VEIN     Drawn by [place]     Sample type [place], ED     Status: None   Collection Time    07/06/14  8:03 PM      Result Value Ref Range   Troponin i, poc 0.01  0.00 - 0.08 ng/mL   Comment 3            Comment: Due to the release kinetics of cTnI,     a negative result within the first hours     of the onset of symptoms does not rule out     myocardial infarction with certainty.     If myocardial infarction is still suspected,     repeat the test at appropriate intervals.  CBG MONITORING, ED     Status: Abnormal   Collection Time    07/06/14  8:07 PM      Result Value Ref Range   Glucose-Capillary 180 (*) 70 - 99 mg/dL  GLUCOSE, CAPILLARY     Status: Abnormal   Collection Time    07/07/14  6:09 AM      Result Value Ref Range   Glucose-Capillary 183 (*) 70 - 99 mg/dL    Physical Findings: AIMS: Facial and Oral Movements Muscles of  Facial Expression: None, normal Lips and Perioral Area: None, normal Jaw: None, normal Tongue: None, normal,Extremity Movements Upper (arms, wrists, hands, fingers): None, normal Lower (legs, knees, ankles, toes): None, normal, Trunk Movements Neck, shoulders, hips: None, normal, Overall Severity Severity of abnormal movements (highest score from questions above): None, normal Incapacitation due to abnormal movements: None, normal Patient's awareness of abnormal movements (rate only patient's report): No Awareness, Dental Status Current problems with teeth and/or dentures?: No Does patient usually wear dentures?: No  CIWA:    COWS:     Treatment Plan Summary: Daily contact with patient to assess and evaluate symptoms and progress in treatment Medication management  Plan/ Assessment: Patient is a 67 y old CM ,with no past hx of psychiatric diagnosis ,presents with SI ,with recent attempt to shoot self and multiple suicide notes to his ex wife and children. His major psychosocial stressor is his recent divorce with his ex wife and the fact that she has started seeing some one else.   Will continue Zoloft 25 mg po daily. Discussed risks,benefits of medication.  Will add Trazodone 50 mg po qhs for sleep.  Will add Vistaril prn for anxiety sx.  Patient has chronic pain issues and is on pain medications .Will restart home medications as needed. Pt is on Oxycodone -reviewed [place] CONTROLLED SUBSTANCE DATABASE-Confirmed it. He is agreeable to try to see if lidocaine patch works for him. Diabetic consult for uncontrolled DM. Pt currently on Lantus 60 mg po qhs . They will continue to follow him. Will restart Lantus as well as restart SSI.   Will continue to monitor vitals ,medication compliance and treatment side effects while patient is here.  Will monitor for medical issues as well as call consult as needed.  Reviewed labs ,will order as needed.  CSW will start working on disposition.   Patient to participate in therapeutic milieu .           Medical Decision Making Problem Points:  Established problem, stable/improving (1), Review of last therapy session (1) and Review of psycho-social stressors (1) Data Points:  Review of medication regiment & side effects (2) Review of new medications or change in dosage (2)  I certify that inpatient services furnished can reasonably be expected to improve the patient's condition.   Webb Weed 07/07/2014, 11:52 AM

## 2014-07-07 NOTE — Progress Notes (Signed)
Patient ID: William Schroeder, male   DOB: 01/25/1961, 53 y.o.   MRN: [id number] Pt returned from ED around 0100. Pt reports feeling sleepy. B/P 131/64 P 52, RR 16. Pt is currently resting in bed.

## 2014-07-07 NOTE — BHH Group Notes (Signed)
[facility] LCSW Aftercare Discharge Planning Group Note   07/07/2014 10:35 AM  Participation Quality: Appropriate   Mood/Affect:  Depressed and Flat  Depression Rating:  5  Anxiety Rating:  0  Thoughts of Suicide:  No Will you contract for safety?   NA  Current AVH:  No  Plan for Discharge/Comments:  Pt reports that he is feeling a little better this morning. He spoke with his exwife last night and reports that she is supportive of him receiving treatment. Pt plans to see psychiatrist at [facility] in [place].   Transportation Means: exwife or one of his kids.   Supports: exwife/kids  Smart, Research officer, trade union

## 2014-07-07 NOTE — Progress Notes (Signed)
D: Patient in the hallway on approach.  Patient bright on approach.  Patient states he had a good day and states he ahs been attending groups.  Patient states he is passive SI but verbally contracts for safety.  Patient denies HI/AVH.  Patient is noncompliant with diet and his DM.  Patient states he does not care what he eats and he states he does not properly care for his diabetes at home. A: Staff to monitor Q 15 mins for safety.  Encouragement and support offered.  Scheduled medications administered per orders.  Oxycodone administered prn for pain. R: Patient remains safe on the unit.  Patient attended group tonight.  Patient visible on the unit and interacting with peers.  Patient taking administered medications.

## 2014-07-07 NOTE — Progress Notes (Signed)
D) Pt has been quiet, forwards little, cooperative on approach. Eye contact fair. Pt has been positive for all unit activities with minimal prompting. Pt hygiene improved. Appetite fair. C/o chronic pain in back and right leg. A) Level 3 obs for safety, CBG's as ordered. Med ed reinforced. Support and reassurance provided. R) Cooperative.

## 2014-07-07 NOTE — Progress Notes (Signed)
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 Pt participate in group.

## 2014-07-07 NOTE — ED Notes (Signed)
Report to Vaughan Basta, Therapist, sports at [facility].

## 2014-07-07 NOTE — ED Notes (Signed)
[facility] called to transport patient to [facility].

## 2014-07-08 DIAGNOSIS — F322 Major depressive disorder, single episode, severe without psychotic features: Secondary | ICD-10-CM

## 2014-07-08 LAB — GLUCOSE, CAPILLARY
Glucose-Capillary: 192 mg/dL — ABNORMAL HIGH (ref 70–99)
Glucose-Capillary: 187 mg/dL — ABNORMAL HIGH (ref 70–99)
Glucose-Capillary: 270 mg/dL — ABNORMAL HIGH (ref 70–99)
Glucose-Capillary: 289 mg/dL — ABNORMAL HIGH (ref 70–99)

## 2014-07-08 MED ORDER — INSULIN GLARGINE 100 UNIT/ML ~~LOC~~ SOLN
62.0000 [IU] | Freq: Every day | SUBCUTANEOUS | Status: DC
  Administered 2014-07-08 – 2014-07-11 (×4): 62 [IU] via SUBCUTANEOUS

## 2014-07-08 MED ORDER — METFORMIN HCL ER 500 MG PO TB24
500.0000 mg | ORAL_TABLET | Freq: Two times a day (BID) | ORAL | Status: DC
  Administered 2014-07-08 – 2014-07-12 (×8): 500 mg via ORAL
  Filled 2014-07-08 (×4): qty 1
  Filled 2014-07-08: qty 6
  Filled 2014-07-08 (×2): qty 1
  Filled 2014-07-08: qty 6
  Filled 2014-07-08 (×4): qty 1

## 2014-07-08 MED ORDER — METFORMIN HCL 500 MG PO TABS
ORAL_TABLET | ORAL | Status: AC
  Administered 2014-07-08: 500 mg
  Filled 2014-07-08: qty 2

## 2014-07-08 NOTE — [facility] Group Notes (Signed)
[place] LCSW Group Therapy  07/08/2014 3:47 PM  Type of Therapy:  Group Therapy  Participation Level:  Active  Participation Quality:  Appropriate  Affect:  Appropriate  Cognitive:  Appropriate  Insight:  Engaged  Engagement in Therapy:  Engaged  Modes of Intervention:  Education  Summary of Progress/Problems:Group today was building coping skills by participants and how to use those coping mechanisms. Group began by defining coping mechanisms and then identify historical examples. There were multiple examples shared that others could find useful as a coping mechanism with tools for application. Patient was able to share how he used his coping mechanisms and learn from the coping mechanisms of the other group members.  Christene Lye MSW, LCSW   Lyla Glassing 07/08/2014, 3:47 PM

## 2014-07-08 NOTE — Progress Notes (Signed)
Patient ID: William Schroeder, male   DOB: 1960/12/02, 53 y.o.   MRN: [id number] [facility] MD Progress Note  07/08/2014 5:25 PM JEHAD BISONO  MRN:  [id number] Subjective: Pt seen and chart reviewed. Pt denies SI, HI, and AVH, contracts for safety. Pt reports "I am sleeping well but my appetite is low. I don't want it to get better since I lost 100lbs and I needed to for my health". Pt reports he feels ready to discharge soon.   Diagnosis:   DSM5:  Primary Psychiatric Diagnosis:  Major depressive disorder,single episode ,severe without psychosis   Secondary psychiatric diagnosis:  Tobacco use disorder   Non Psychiatric Diagnosis:  See PMH       Total Time spent with patient: 25 minutes   ADL's:  Intact  Sleep: Fair  Appetite:  Poor   Psychiatric Specialty Exam: Physical Exam  ROS  Blood pressure 132/72, pulse 64, temperature 98.2 F (36.8 C), temperature source Oral, resp. rate 16, height 5' 9"  (1.753 m), weight 92.534 kg (204 lb), SpO2 94.00%.Body mass index is 30.11 kg/(m^2).  General Appearance: Casual  Eye Contact::  Fair  Speech:  Clear and Coherent  Volume:  Normal  Mood:  Anxious and Depressed  Affect:  Labile  Thought Process:  Coherent  Orientation:  Full (Time, Place, and Person)  Thought Content:  WDL  Suicidal Thoughts:  No  Homicidal Thoughts:  No  Memory:  Immediate;   Fair Recent;   Fair Remote;   Fair  Judgement:  Impaired  Insight:  Lacking  Psychomotor Activity:  Normal  Concentration:  Fair  Recall:  [employer] of Knowledge:Good  Language: Good  Akathisia:  No  Handed:  Right  AIMS (if indicated):     Assets:  Communication Skills Desire for Improvement Housing Social Support  Sleep:  Number of Hours: 4.25   Musculoskeletal: Strength & Muscle Tone: within normal limits Gait & Station: normal Patient leans: N/A  Current Medications: Current Facility-Administered Medications  Medication Dose Route Frequency Provider Last Rate Last Dose   . acetaminophen (TYLENOL) tablet 650 mg  650 mg Oral Q6H PRN Ursula Alert, MD      . alum & mag hydroxide-simeth (MAALOX/MYLANTA) 200-200-20 MG/5ML suspension 30 mL  30 mL Oral Q4H PRN Saramma Eappen, MD      . feeding supplement (GLUCERNA SHAKE) (GLUCERNA SHAKE) liquid 237 mL  237 mL Oral TID BM Saramma Eappen, MD   237 mL at 07/07/14 1004  . hydrocerin (EUCERIN) cream   Topical BID Ursula Alert, MD      . hydrOXYzine (ATARAX/VISTARIL) tablet 25 mg  25 mg Oral TID PRN Ursula Alert, MD      . Influenza vac split quadrivalent PF (FLUARIX) injection 0.5 mL  0.5 mL Intramuscular Tomorrow-1000 Saramma Eappen, MD      . insulin aspart (novoLOG) injection 0-15 Units  0-15 Units Subcutaneous TID WC Ursula Alert, MD   5 Units at 07/08/14 1719  . insulin glargine (LANTUS) injection 60 Units  60 Units Subcutaneous QHS Ursula Alert, MD   60 Units at 07/07/14 2205  . lidocaine (LIDODERM) 5 % 1 patch  1 patch Transdermal Q24H Ursula Alert, MD   1 patch at 07/07/14 1451  . lisinopril (PRINIVIL,ZESTRIL) tablet 20 mg  20 mg Oral Daily Saramma Eappen, MD   20 mg at 07/08/14 1024  . magnesium hydroxide (MILK OF MAGNESIA) suspension 30 mL  30 mL Oral Daily PRN Ursula Alert, MD      .  metFORMIN (GLUCOPHAGE-XR) 24 hr tablet 500 mg  500 mg Oral BID WC Benjamine Mola, FNP      . MUSCLE RUB CREA   Topical PRN Ursula Alert, MD      . nicotine polacrilex (NICORETTE) gum 2 mg  2 mg Oral PRN Ursula Alert, MD   2 mg at 07/08/14 1708  . oxyCODONE (Oxy IR/ROXICODONE) immediate release tablet 15 mg  15 mg Oral Q8H PRN Ursula Alert, MD   15 mg at 07/08/14 1027  . pneumococcal 23 valent vaccine (PNU-IMMUNE) injection 0.5 mL  0.5 mL Intramuscular Tomorrow-1000 Saramma Eappen, MD      . sertraline (ZOLOFT) tablet 25 mg  25 mg Oral Q breakfast Saramma Eappen, MD   25 mg at 07/08/14 1024  . simvastatin (ZOCOR) tablet 40 mg  40 mg Oral q1800 Ursula Alert, MD   20 mg at 07/08/14 1718  . traZODone (DESYREL) tablet  50 mg  50 mg Oral QHS Ursula Alert, MD   50 mg at 07/07/14 2206    Lab Results:  Results for orders placed during the hospital encounter of 07/06/14 (from the past 48 hour(s))  CBG MONITORING, ED     Status: Abnormal   Collection Time    07/06/14  6:27 PM      Result Value Ref Range   Glucose-Capillary 293 (*) 70 - 99 mg/dL  URINALYSIS, ROUTINE W REFLEX MICROSCOPIC     Status: Abnormal   Collection Time    07/06/14  7:13 PM      Result Value Ref Range   Color, Urine YELLOW  YELLOW   APPearance CLEAR  CLEAR   Specific Gravity, Urine 1.036 (*) 1.005 - 1.030   pH 5.5  5.0 - 8.0   Glucose, UA >1000 (*) NEGATIVE mg/dL   Hgb urine dipstick NEGATIVE  NEGATIVE   Bilirubin Urine NEGATIVE  NEGATIVE   Ketones, ur NEGATIVE  NEGATIVE mg/dL   Protein, ur NEGATIVE  NEGATIVE mg/dL   Urobilinogen, UA 1.0  0.0 - 1.0 mg/dL   Nitrite NEGATIVE  NEGATIVE   Leukocytes, UA NEGATIVE  NEGATIVE  URINE MICROSCOPIC-ADD ON     Status: None   Collection Time    07/06/14  7:13 PM      Result Value Ref Range   Urine-Other       Value: NO FORMED ELEMENTS SEEN ON URINE MICROSCOPIC EXAMINATION  CBC     Status: None   Collection Time    07/06/14  7:37 PM      Result Value Ref Range   WBC 7.8  4.0 - 10.5 K/uL   RBC 4.81  4.22 - 5.81 MIL/uL   Hemoglobin 14.2  13.0 - 17.0 g/dL   HCT 40.6  39.0 - 52.0 %   MCV 84.4  78.0 - 100.0 fL   MCH 29.5  26.0 - 34.0 pg   MCHC 35.0  30.0 - 36.0 g/dL   RDW 12.9  11.5 - 15.5 %   Platelets 234  150 - 400 K/uL  COMPREHENSIVE METABOLIC PANEL     Status: Abnormal   Collection Time    07/06/14  7:37 PM      Result Value Ref Range   Sodium 139  137 - 147 mEq/L   Potassium 4.1  3.7 - 5.3 mEq/L   Chloride 101  96 - 112 mEq/L   CO2 28  19 - 32 mEq/L   Glucose, Bld 206 (*) 70 - 99 mg/dL   BUN 13  6 -  23 mg/dL   Creatinine, Ser 0.83  0.50 - 1.35 mg/dL   Calcium 10.0  8.4 - 10.5 mg/dL   Total Protein 7.1  6.0 - 8.3 g/dL   Albumin 3.3 (*) 3.5 - 5.2 g/dL   AST 16  0 - 37 U/L    ALT 22  0 - 53 U/L   Alkaline Phosphatase 109  39 - 117 U/L   Total Bilirubin 0.4  0.3 - 1.2 mg/dL   GFR calc non Af Amer >90  >90 mL/min   GFR calc Af Amer >90  >90 mL/min   Comment: (NOTE)     The eGFR has been calculated using the CKD EPI equation.     This calculation has not been validated in all clinical situations.     eGFR's persistently <90 mL/min signify possible Chronic Kidney     Disease.   Anion gap 10  5 - 15  HEMOGLOBIN A1C     Status: Abnormal   Collection Time    07/06/14  7:38 PM      Result Value Ref Range   Hemoglobin A1C 13.4 (*) <5.7 %   Comment: (NOTE)                                                                               According to the ADA Clinical Practice Recommendations for 2011, when     HbA1c is used as a screening test:      >=6.5%   Diagnostic of Diabetes Mellitus               (if abnormal result is confirmed)     5.7-6.4%   Increased risk of developing Diabetes Mellitus     References:Diagnosis and Classification of Diabetes Mellitus,Diabetes     NFAO,1308,65(HQION 1):S62-S69 and Standards of Medical Care in             Diabetes - 2011,Diabetes Care,2011,34 (Suppl 1):S11-S61.   Mean Plasma Glucose 338 (*) <117 mg/dL   Comment: Performed at [place], [facility]     Status: Abnormal   Collection Time    07/06/14  7:38 PM      Result Value Ref Range   FIO2 0.21     Delivery systems ROOM AIR     pH, Ven 7.348 (*) 7.250 - 7.300   pCO2, Ven 51.1 (*) 45.0 - 50.0 mmHg   pO2, Ven BELOW REPORTABLE RANGE  30.0 - 45.0 mmHg   Comment: CRITICAL RESULT CALLED TO, READ BACK BY AND VERIFIED WITH:     BRANDY HARRIS, RN AT 2002 BY ANNALISSA AGUSTIN,RRT,RCP ON 07/06/14   Bicarbonate 27.5 (*) 20.0 - 24.0 mEq/L   TCO2 24.5  0 - 100 mmol/L   Acid-Base Excess 1.3  0.0 - 2.0 mmol/L   O2 Saturation 77.1     Patient temperature 98.1     Collection site VEIN     Drawn by [place]     Sample type [place], ED      Status: None   Collection Time    07/06/14  8:03 PM      Result Value Ref Range   Troponin  i, poc 0.01  0.00 - 0.08 ng/mL   Comment 3            Comment: Due to the release kinetics of cTnI,     a negative result within the first hours     of the onset of symptoms does not rule out     myocardial infarction with certainty.     If myocardial infarction is still suspected,     repeat the test at appropriate intervals.  CBG MONITORING, ED     Status: Abnormal   Collection Time    07/06/14  8:07 PM      Result Value Ref Range   Glucose-Capillary 180 (*) 70 - 99 mg/dL  GLUCOSE, CAPILLARY     Status: Abnormal   Collection Time    07/07/14  6:09 AM      Result Value Ref Range   Glucose-Capillary 183 (*) 70 - 99 mg/dL  GLUCOSE, CAPILLARY     Status: Abnormal   Collection Time    07/07/14 12:00 PM      Result Value Ref Range   Glucose-Capillary 242 (*) 70 - 99 mg/dL  GLUCOSE, CAPILLARY     Status: Abnormal   Collection Time    07/07/14  5:09 PM      Result Value Ref Range   Glucose-Capillary 294 (*) 70 - 99 mg/dL  GLUCOSE, CAPILLARY     Status: Abnormal   Collection Time    07/07/14  5:27 PM      Result Value Ref Range   Glucose-Capillary 266 (*) 70 - 99 mg/dL  GLUCOSE, CAPILLARY     Status: Abnormal   Collection Time    07/07/14  9:28 PM      Result Value Ref Range   Glucose-Capillary 241 (*) 70 - 99 mg/dL  GLUCOSE, CAPILLARY     Status: Abnormal   Collection Time    07/08/14  6:03 AM      Result Value Ref Range   Glucose-Capillary 192 (*) 70 - 99 mg/dL   Comment 1 Notify RN    GLUCOSE, CAPILLARY     Status: Abnormal   Collection Time    07/08/14 11:54 AM      Result Value Ref Range   Glucose-Capillary 187 (*) 70 - 99 mg/dL  GLUCOSE, CAPILLARY     Status: Abnormal   Collection Time    07/08/14  5:04 PM      Result Value Ref Range   Glucose-Capillary 270 (*) 70 - 99 mg/dL   Comment 1 Notify RN     Comment 2 Documented in Chart      Physical Findings: AIMS:  Facial and Oral Movements Muscles of Facial Expression: None, normal Lips and Perioral Area: None, normal Jaw: None, normal Tongue: None, normal,Extremity Movements Upper (arms, wrists, hands, fingers): None, normal Lower (legs, knees, ankles, toes): None, normal, Trunk Movements Neck, shoulders, hips: None, normal, Overall Severity Severity of abnormal movements (highest score from questions above): None, normal Incapacitation due to abnormal movements: None, normal Patient's awareness of abnormal movements (rate only patient's report): No Awareness, Dental Status Current problems with teeth and/or dentures?: No Does patient usually wear dentures?: No  CIWA:    COWS:     Treatment Plan Summary: Daily contact with patient to assess and evaluate symptoms and progress in treatment Medication management  Plan/ Assessment: Patient is a 2 y old CM ,with no past hx of psychiatric diagnosis ,presents with SI ,with recent attempt to shoot self  and multiple suicide notes to his ex wife and children. His major psychosocial stressor is his recent divorce with his ex wife and the fact that she has started seeing some one else.   Will continue Zoloft 25 mg po daily. Discussed risks,benefits of medication.  Will continue Trazodone 50 mg po qhs for sleep.  Will continue Vistaril prn for anxiety sx.  -Start Metformin 575m XR bid wc for better glycemic control (mid 200's in AM).  -Increase Lantus to 62 units qhs due to spiking AM CBG's.   Patient has chronic pain issues and is on pain medications .Will restart home medications as needed. Pt is on Oxycodone -reviewed  CONTROLLED SUBSTANCE DATABASE-Confirmed it. He is agreeable to try to see if lidocaine patch works for him. Diabetic consult for uncontrolled DM. Pt currently on Lantus 60 mg po qhs . They will continue to follow him. Will restart Lantus as well as restart SSI.   Will continue to monitor vitals ,medication compliance and treatment  side effects while patient is here.  Will monitor for medical issues as well as call consult as needed.  Reviewed labs ,will order as needed.  CSW will start working on disposition.  Patient to participate in therapeutic milieu .           Medical Decision Making Problem Points:  Established problem, stable/improving (1), Review of last therapy session (1) and Review of psycho-social stressors (1) Data Points:  Review of medication regiment & side effects (2) Review of new medications or change in dosage (2)  I certify that inpatient services furnished can reasonably be expected to improve the patient's condition.   WBenjamine Mola FNP-BC 07/08/2014, 5:25 PM  Agree with Assessment and Plan

## 2014-07-08 NOTE — Progress Notes (Signed)
[facility] Group Notes:  (Nursing/MT/Case Management/Adjunct)  Date:  07/08/2014  Time:  10:04 PM  Type of Therapy:  Psychoeducational Skills  Participation Level:  Active  Participation Quality:  Appropriate  Affect:  Appropriate  Cognitive:  Appropriate  Insight:  Improving  Engagement in Group:  Improving  Modes of Intervention:  Education  Summary of Progress/Problems: The patient verbalized that he had a better day as compared to yesterday. He stated that talking to his peers assisted him with having a better day and that the groups were very helpful. As a theme for the day, his coping skills are as follows: prayer and going to work. The patient stated that he is proud of the fact that he has worked for the same company for 32 years.   Jenie Parish S 07/08/2014, 10:04 PM

## 2014-07-08 NOTE — Progress Notes (Signed)
Patient attended [place] meeting. Tonight we had a speaker meeting. They actively listened. He had visitors this evening, but after group he went back to his room. He was cooperative.  Braylei Totino A 1:16 AM

## 2014-07-08 NOTE — Progress Notes (Signed)
Patient ID: William Schroeder, male   DOB: October 09, 1960, 53 y.o.   MRN: [id number] D)  Has been out and about on the hall this evening, attended group, refused glucerna,  But had hs snack.  Has been interacting appropriately with staff and peers this evening.  Requested and was given roxicodone for pain in back.  Has been pleasant, cooperative, compliant . A)  Will continue to monitor for safety, continueOC R)  Safety maintained.

## 2014-07-08 NOTE — [facility] Group Notes (Addendum)
[place] LCSW Group Therapy  07/08/2014 3:38 PM  Type of Therapy:  Group Therapy  Participation Level:  Active  Participation Quality:  Appropriate  Affect:  Appropriate  Cognitive:  Alert  Insight:  Engaged and Improving  Engagement in Therapy:  Engaged  Modes of Intervention:  Education  Summary of Progress/Problems: Group today was building coping skills by participants and how to use those coping mechanisms. Group began by defining coping mechanisms and then identify historical examples. There were multiple examples shared that others could find useful as a coping mechanism with tools for application. Patient was able to share how he used his coping mechanisms and learn from the coping mechanisms of the other group members.  Christene Lye MSW, LCSW  Lyla Glassing 07/08/2014, 3:38 PM

## 2014-07-09 DIAGNOSIS — F322 Major depressive disorder, single episode, severe without psychotic features: Secondary | ICD-10-CM | POA: Insufficient documentation

## 2014-07-09 LAB — GLUCOSE, CAPILLARY
GLUCOSE-CAPILLARY: 236 mg/dL — AB (ref 70–99)
Glucose-Capillary: 286 mg/dL — ABNORMAL HIGH (ref 70–99)
Glucose-Capillary: 261 mg/dL — ABNORMAL HIGH (ref 70–99)

## 2014-07-09 MED ORDER — INSULIN ASPART 100 UNIT/ML ~~LOC~~ SOLN
0.0000 [IU] | Freq: Three times a day (TID) | SUBCUTANEOUS | Status: DC
  Administered 2014-07-09: 11 [IU] via SUBCUTANEOUS
  Administered 2014-07-10: 15 [IU] via SUBCUTANEOUS
  Administered 2014-07-10 – 2014-07-12 (×4): 7 [IU] via SUBCUTANEOUS

## 2014-07-09 NOTE — Progress Notes (Signed)
Patient ID: William Schroeder, male   DOB: 1961/03/24, 53 y.o.   MRN: [id number] [facility] MD Progress Note  07/09/2014 1:12 PM [name]  MRN:  [id number] Subjective:  Pt seen and chart reviewed. Pt denies SI, HI, and AVH, contracts for safety. Pt reports that he feels he is benefiting greatly from group therapy interaction and that he is learning coping skills that he will be able to leave when he discharges from [facility]. Pt reports poor sleep last night, but that "for years, this happens once in awhile and I don't want any meds changes for it".   Diagnosis:   DSM5:  Primary Psychiatric Diagnosis:  Major depressive disorder,single episode ,severe without psychosis   Secondary psychiatric diagnosis:  Tobacco use disorder   Non Psychiatric Diagnosis:  See PMH    Total Time spent with patient: 25 minutes   ADL's:  Intact  Sleep: Fair  Appetite:  Poor   Psychiatric Specialty Exam: Physical Exam  ROS  Blood pressure 114/69, pulse 81, temperature 98.4 F (36.9 C), temperature source Oral, resp. rate 18, height 5\' 9"  (1.753 m), weight 92.534 kg (204 lb), SpO2 94 %.Body mass index is 30.11 kg/(m^2).  General Appearance: Casual  Eye Contact::  Fair  Speech:  Clear and Coherent  Volume:  Normal  Mood:  Euthymic  Affect:  Labile  Thought Process:  Coherent  Orientation:  Full (Time, Place, and Person)  Thought Content:  WDL  Suicidal Thoughts:  No  Homicidal Thoughts:  No  Memory:  Immediate;   Fair Recent;   Fair Remote;   Fair  Judgement:  Fair  Insight:  Fair  Psychomotor Activity:  Normal  Concentration:  Fair  Recall:  [employer] of Knowledge:Good  Language: Good  Akathisia:  No  Handed:  Right  AIMS (if indicated):     Assets:  Communication Skills Desire for Improvement Housing Social Support  Sleep:  Number of Hours: 7.25   Musculoskeletal: Strength & Muscle Tone: within normal limits Gait & Station: normal Patient leans: N/A  Current Medications: Current  Facility-Administered Medications  Medication Dose Route Frequency Provider Last Rate Last Dose  . acetaminophen (TYLENOL) tablet 650 mg  650 mg Oral Q6H PRN Ursula Alert, MD      . alum & mag hydroxide-simeth (MAALOX/MYLANTA) 200-200-20 MG/5ML suspension 30 mL  30 mL Oral Q4H PRN Saramma Eappen, MD      . feeding supplement (GLUCERNA SHAKE) (GLUCERNA SHAKE) liquid 237 mL  237 mL Oral TID BM Saramma Eappen, MD   237 mL at 07/07/14 1004  . hydrocerin (EUCERIN) cream   Topical BID Ursula Alert, MD      . hydrOXYzine (ATARAX/VISTARIL) tablet 25 mg  25 mg Oral TID PRN Ursula Alert, MD      . Influenza vac split quadrivalent PF (FLUARIX) injection 0.5 mL  0.5 mL Intramuscular Tomorrow-1000 Saramma Eappen, MD      . insulin aspart (novoLOG) injection 0-20 Units  0-20 Units Subcutaneous TID WC Benjamine Mola, FNP      . insulin glargine (LANTUS) injection 62 Units  62 Units Subcutaneous QHS Benjamine Mola, FNP   62 Units at 07/08/14 2142  . lidocaine (LIDODERM) 5 % 1 patch  1 patch Transdermal Q24H Ursula Alert, MD   1 patch at 07/07/14 1451  . lisinopril (PRINIVIL,ZESTRIL) tablet 20 mg  20 mg Oral Daily Ursula Alert, MD   20 mg at 07/09/14 0906  . magnesium hydroxide (MILK OF MAGNESIA) suspension 30  mL  30 mL Oral Daily PRN Ursula Alert, MD      . metFORMIN (GLUCOPHAGE-XR) 24 hr tablet 500 mg  500 mg Oral BID WC Benjamine Mola, FNP   500 mg at 07/09/14 0906  . MUSCLE RUB CREA   Topical PRN Ursula Alert, MD      . nicotine polacrilex (NICORETTE) gum 2 mg  2 mg Oral PRN Ursula Alert, MD   2 mg at 07/09/14 0906  . oxyCODONE (Oxy IR/ROXICODONE) immediate release tablet 15 mg  15 mg Oral Q8H PRN Ursula Alert, MD   15 mg at 07/09/14 0908  . pneumococcal 23 valent vaccine (PNU-IMMUNE) injection 0.5 mL  0.5 mL Intramuscular Tomorrow-1000 Saramma Eappen, MD      . sertraline (ZOLOFT) tablet 25 mg  25 mg Oral Q breakfast Ursula Alert, MD   25 mg at 07/09/14 0906  . simvastatin (ZOCOR) tablet  40 mg  40 mg Oral q1800 Ursula Alert, MD   20 mg at 07/08/14 1718  . traZODone (DESYREL) tablet 50 mg  50 mg Oral QHS Ursula Alert, MD   50 mg at 07/08/14 2141    Lab Results:  Results for orders placed or performed during the hospital encounter of 07/06/14 (from the past 48 hour(s))  Glucose, capillary     Status: Abnormal   Collection Time: 07/07/14  5:09 PM  Result Value Ref Range   Glucose-Capillary 294 (H) 70 - 99 mg/dL  Glucose, capillary     Status: Abnormal   Collection Time: 07/07/14  5:27 PM  Result Value Ref Range   Glucose-Capillary 266 (H) 70 - 99 mg/dL  Glucose, capillary     Status: Abnormal   Collection Time: 07/07/14  9:28 PM  Result Value Ref Range   Glucose-Capillary 241 (H) 70 - 99 mg/dL  Glucose, capillary     Status: Abnormal   Collection Time: 07/08/14  6:03 AM  Result Value Ref Range   Glucose-Capillary 192 (H) 70 - 99 mg/dL   Comment 1 Notify RN   Glucose, capillary     Status: Abnormal   Collection Time: 07/08/14 11:54 AM  Result Value Ref Range   Glucose-Capillary 187 (H) 70 - 99 mg/dL  Glucose, capillary     Status: Abnormal   Collection Time: 07/08/14  5:04 PM  Result Value Ref Range   Glucose-Capillary 270 (H) 70 - 99 mg/dL   Comment 1 Notify RN    Comment 2 Documented in Chart   Glucose, capillary     Status: Abnormal   Collection Time: 07/08/14  9:05 PM  Result Value Ref Range   Glucose-Capillary 289 (H) 70 - 99 mg/dL   Comment 1 Notify RN   Glucose, capillary     Status: Abnormal   Collection Time: 07/09/14 11:36 AM  Result Value Ref Range   Glucose-Capillary 286 (H) 70 - 99 mg/dL   Comment 1 Notify RN     Physical Findings: AIMS: Facial and Oral Movements Muscles of Facial Expression: None, normal Lips and Perioral Area: None, normal Jaw: None, normal Tongue: None, normal,Extremity Movements Upper (arms, wrists, hands, fingers): None, normal Lower (legs, knees, ankles, toes): None, normal, Trunk Movements Neck, shoulders,  hips: None, normal, Overall Severity Severity of abnormal movements (highest score from questions above): None, normal Incapacitation due to abnormal movements: None, normal Patient's awareness of abnormal movements (rate only patient's report): No Awareness, Dental Status Current problems with teeth and/or dentures?: No Does patient usually wear dentures?: No  CIWA:  COWS:     Treatment Plan Summary: Daily contact with patient to assess and evaluate symptoms and progress in treatment Medication management  Plan/ Assessment: Patient is a 66 y old CM ,with no past hx of psychiatric diagnosis ,presents with SI ,with recent attempt to shoot self and multiple suicide notes to his ex wife and children. His major psychosocial stressor is his recent divorce with his ex wife and the fact that she has started seeing some one else.   Will continue Zoloft 25 mg po daily. Discussed risks,benefits of medication.  Will continue Trazodone 50 mg po qhs for sleep.  Will continue Vistaril prn for anxiety sx.  -Continue Metformin 500mg  XR bid wc for better glycemic control (improving and now high 100's in AM, but still high 200's mid day) -Continue Lantus 62 units qhs due to spiking AM CBG's (improving and down to high 100's) -Increase sliding scale insulin from moderate to resistant (pt has been high 200's during the day)  Patient has chronic pain issues and is on pain medications .Will restart home medications as needed. Pt is on Oxycodone -reviewed [place] CONTROLLED SUBSTANCE DATABASE-Confirmed it. He is agreeable to try to see if lidocaine patch works for him. Diabetic consult for uncontrolled DM. Pt currently on Lantus 62 units qhs . They will continue to follow him.   Will continue to monitor vitals ,medication compliance and treatment side effects while patient is here.  Will monitor for medical issues as well as call consult as needed.  Reviewed labs ,will order as needed.  CSW will start working on  disposition.  Patient to participate in therapeutic milieu .       Medical Decision Making Problem Points:  Established problem, stable/improving (1), Review of last therapy session (1) and Review of psycho-social stressors (1) Data Points:  Review of medication regiment & side effects (2) Review of new medications or change in dosage (2)  I certify that inpatient services furnished can reasonably be expected to improve the patient's condition.   Benjamine Mola, FNP-BC 07/09/2014, 1:12 PM  Agree with Assessment and Plan

## 2014-07-09 NOTE — BHH Group Notes (Signed)
[place] LCSW Group Therapy 07/09/2014 10:00am  Type of Therapy: Group Therapy- Feelings Around Discharge & Establishing a Supportive Framework  Participation Level: Active   Participation Quality:  Appropriate  Affect:  Appropriate   Cognitive: Alert and Oriented   Insight:  Developing/Improving   Engagement in Therapy: Developing/Improving and Engaged   Modes of Intervention: Clarification, Confrontation, Discussion, Education, Exploration, Limit-setting, Orientation, Problem-solving, Rapport Building, Art therapist, Socialization and Support   Description of Group:   What is a supportive framework? What does it look like feel like and how do I discern it from and unhealthy non-supportive network? Learn how to cope when supports are not helpful and don't support you. Discuss what to do when your family/friends are not supportive. Pt participated actively in group discussion, identifying characteristics of a positive support. Pt discussed the importance of patience in a support in the context of "not rushing" as well as being patient with the recovery process. Pt identified his daughter as a positive support in his life, stating "she's my little rock." Pt reports that he feels he should communicate more openly with her in order to better utilize her as a support.    Therapeutic Modalities:   Cognitive Behavioral Therapy Person-Centered Therapy Motivational Interviewing   Peri Maris, [place] 07/09/2014 11:15 AM

## 2014-07-09 NOTE — Progress Notes (Signed)
Psychoeducational Group Note  Late entry on 07-09-2014 for 07-08-2014  Date:07/08/2014  Time:  1015  Group Topic/Focus:  Identifying Needs:   The focus of this group is to help patients identify their personal needs that have been historically problematic and identify healthy behaviors to address their needs.  Participation Level:  Active  Participation Quality:  Appropriate  Affect:  Appropriate  Cognitive:  Oriented  Insight:  Improving  Engagement in Group:  Engaged  Additional Comments:  William Schroeder

## 2014-07-09 NOTE — Progress Notes (Signed)
Psychoeducational Group Note  Date: 07/09/2014 Time:  1015  Group Topic/Focus:  Identifying Needs:   The focus of this group is to help patients identify their personal needs that have been historically problematic and identify healthy behaviors to address their needs.  Participation Level:  Active  Participation Quality:  Appropriate  Affect:  Appropriate  Cognitive:  Oriented  Insight:  Improving  Engagement in Group:  Engaged  Additional Comments:    Paulino Rily

## 2014-07-09 NOTE — Progress Notes (Signed)
.  Psychoeducational Group Note  Late entry for 07-08-14 at 0900  Date: 07/09/2014 Time:  0900   Goal Setting Purpose of Group: To be able to set a goal that is measurable and that can be accomplished in one day Participation Level:  Active  Participation Quality:  Appropriate  Affect:  Depressed  Cognitive:  Oriented  Insight:  Improving  Engagement in Group:  Engaged  Additional Comments:   William Schroeder

## 2014-07-10 DIAGNOSIS — F17200 Nicotine dependence, unspecified, uncomplicated: Secondary | ICD-10-CM | POA: Insufficient documentation

## 2014-07-10 DIAGNOSIS — F329 Major depressive disorder, single episode, unspecified: Secondary | ICD-10-CM

## 2014-07-10 LAB — GLUCOSE, CAPILLARY
GLUCOSE-CAPILLARY: 199 mg/dL — AB (ref 70–99)
GLUCOSE-CAPILLARY: 179 mg/dL — AB (ref 70–99)
Glucose-Capillary: 227 mg/dL — ABNORMAL HIGH (ref 70–99)
Glucose-Capillary: 217 mg/dL — ABNORMAL HIGH (ref 70–99)
Glucose-Capillary: 304 mg/dL — ABNORMAL HIGH (ref 70–99)

## 2014-07-10 MED ORDER — SERTRALINE HCL 50 MG PO TABS
50.0000 mg | ORAL_TABLET | Freq: Every day | ORAL | Status: DC
  Administered 2014-07-11: 50 mg via ORAL
  Filled 2014-07-10 (×3): qty 1

## 2014-07-10 NOTE — Progress Notes (Signed)
Patient ID: William Schroeder, male   DOB: March 19, 1961, 53 y.o.   MRN: [id number] D)  Saw pt briefly at the beginning of the shift,  Was visiting with a woman and it appeared to be going well, was smiling and appeared happy.  Unsure if it was his wife.  Attended group afterward and was outside the med window, in line for meds.  RN had to go off the unit to do an admission and he didn't stay for his meds, but went to bed.   A)  Monitored q 15 minutes for safety R)  Safety maintained.

## 2014-07-10 NOTE — BHH Group Notes (Signed)
[place] LCSW Group Therapy 07/10/2014  1:15 pm  Type of Therapy: Group Therapy Participation Level: Active  Participation Quality: Attentive, Sharing and Supportive  Affect: Depressed and Flat  Cognitive: Alert and Oriented  Insight: Developing/Improving and Engaged  Engagement in Therapy: Developing/Improving and Engaged  Modes of Intervention: Clarification, Confrontation, Discussion, Education, Exploration,  Limit-setting, Orientation, Problem-solving, Rapport Building, Art therapist, Socialization and Support  Summary of Progress/Problems: Pt identified obstacles faced currently and processed barriers involved in overcoming these obstacles. Pt identified steps necessary for overcoming these obstacles and explored motivation (internal and external) for facing these difficulties head on. Pt further identified one area of concern in their lives and chose a goal to focus on for today. Patient identified his primary obstacle as "living alone". CSW and patient explored his support system and ways to stay connected with others despite new living arrangements. CSW provided emotional support and encouragement.  Tilden Fossa, MSW, [place] Worker [facility] [phone]

## 2014-07-10 NOTE — Progress Notes (Signed)
Patient ID: William Schroeder, male   DOB: 11/05/1960, 53 y.o.   MRN: [id number] [facility] MD Progress Note  07/10/2014 4:09 PM William Schroeder  MRN:  [id number] Subjective: Patient states,' I want to get therapy ,I want my ex wife to give me another chance."   Objective: Pt seen and chart reviewed. Pt denies SI, HI, and AVH, contracts for safety. Pt reports that he wants to go for therapy by himself or with his ex wife ,so that they can continue to work on their marriage.  Patient appears to be pleasant and appears to have insight in to his relational issues and knows that if his wife is not willing to try to work on it ,he will try to move on. Pt reports sleep as fair and appetite as improving.   Diagnosis:   DSM5:  Primary Psychiatric Diagnosis:  Major depressive disorder,single episode ,severe without psychosis   Secondary psychiatric diagnosis:  Tobacco use disorder   Non Psychiatric Diagnosis:  See PMH    Total Time spent with patient: 30 minutes   ADL's:  Intact  Sleep: Fair  Appetite:  Fair   Psychiatric Specialty Exam: Physical Exam  ROS  Blood pressure 95/54, pulse 86, temperature 97.8 F (36.6 C), temperature source Oral, resp. rate 18, height 5\' 9"  (1.753 m), weight 92.534 kg (204 lb), SpO2 94 %.Body mass index is 30.11 kg/(m^2).  General Appearance: Casual  Eye Contact::  Fair  Speech:  Clear and Coherent  Volume:  Normal  Mood:  Euthymic  Affect:  Congruent  Thought Process:  Coherent  Orientation:  Full (Time, Place, and Person)  Thought Content:  WDL  Suicidal Thoughts:  No  Homicidal Thoughts:  No  Memory:  Immediate;   Fair Recent;   Fair Remote;   Fair  Judgement:  Fair  Insight:  Fair  Psychomotor Activity:  Normal  Concentration:  Fair  Recall:  [employer] of Knowledge:Good  Language: Good  Akathisia:  No  Handed:  Right  AIMS (if indicated):     Assets:  Communication Skills Desire for Improvement Housing Social Support  Sleep:  Number of  Hours: 4.5   Musculoskeletal: Strength & Muscle Tone: within normal limits Gait & Station: normal Patient leans: N/A  Current Medications: Current Facility-Administered Medications  Medication Dose Route Frequency Provider Last Rate Last Dose  . acetaminophen (TYLENOL) tablet 650 mg  650 mg Oral Q6H PRN Ursula Alert, MD   650 mg at 07/10/14 0845  . alum & mag hydroxide-simeth (MAALOX/MYLANTA) 200-200-20 MG/5ML suspension 30 mL  30 mL Oral Q4H PRN Baldwin Racicot, MD      . feeding supplement (GLUCERNA SHAKE) (GLUCERNA SHAKE) liquid 237 mL  237 mL Oral TID BM Girtha Kilgore, MD   237 mL at 07/10/14 0842  . hydrocerin (EUCERIN) cream   Topical BID Ursula Alert, MD      . hydrOXYzine (ATARAX/VISTARIL) tablet 25 mg  25 mg Oral TID PRN Ursula Alert, MD      . insulin aspart (novoLOG) injection 0-20 Units  0-20 Units Subcutaneous TID WC Benjamine Mola, FNP   7 Units at 07/10/14 1204  . insulin glargine (LANTUS) injection 62 Units  62 Units Subcutaneous QHS Benjamine Mola, FNP   62 Units at 07/10/14 0209  . lidocaine (LIDODERM) 5 % 1 patch  1 patch Transdermal Q24H Ursula Alert, MD   1 patch at 07/10/14 1203  . lisinopril (PRINIVIL,ZESTRIL) tablet 20 mg  20 mg Oral Daily Shaquna Geigle  Joram Venson, MD   20 mg at 07/10/14 0843  . magnesium hydroxide (MILK OF MAGNESIA) suspension 30 mL  30 mL Oral Daily PRN Ursula Alert, MD      . metFORMIN (GLUCOPHAGE-XR) 24 hr tablet 500 mg  500 mg Oral BID WC Benjamine Mola, FNP   500 mg at 07/10/14 0842  . MUSCLE RUB CREA   Topical PRN Ursula Alert, MD      . nicotine polacrilex (NICORETTE) gum 2 mg  2 mg Oral PRN Ursula Alert, MD   2 mg at 07/10/14 0843  . oxyCODONE (Oxy IR/ROXICODONE) immediate release tablet 15 mg  15 mg Oral Q8H PRN Ursula Alert, MD   15 mg at 07/10/14 0848  . [START ON 07/11/2014] sertraline (ZOLOFT) tablet 50 mg  50 mg Oral Q breakfast Yassmine Tamm, MD      . simvastatin (ZOCOR) tablet 40 mg  40 mg Oral q1800 Ursula Alert, MD   40 mg  at 07/09/14 1811  . traZODone (DESYREL) tablet 50 mg  50 mg Oral QHS Ursula Alert, MD   50 mg at 07/10/14 0210    Lab Results:  Results for orders placed or performed during the hospital encounter of 07/06/14 (from the past 48 hour(s))  Glucose, capillary     Status: Abnormal   Collection Time: 07/08/14  5:04 PM  Result Value Ref Range   Glucose-Capillary 270 (H) 70 - 99 mg/dL   Comment 1 Notify RN    Comment 2 Documented in Chart   Glucose, capillary     Status: Abnormal   Collection Time: 07/08/14  9:05 PM  Result Value Ref Range   Glucose-Capillary 289 (H) 70 - 99 mg/dL   Comment 1 Notify RN   Glucose, capillary     Status: Abnormal   Collection Time: 07/09/14  7:12 AM  Result Value Ref Range   Glucose-Capillary 199 (H) 70 - 99 mg/dL  Glucose, capillary     Status: Abnormal   Collection Time: 07/09/14 11:36 AM  Result Value Ref Range   Glucose-Capillary 286 (H) 70 - 99 mg/dL   Comment 1 Notify RN   Glucose, capillary     Status: Abnormal   Collection Time: 07/09/14  4:53 PM  Result Value Ref Range   Glucose-Capillary 261 (H) 70 - 99 mg/dL   Comment 1 Notify RN   Glucose, capillary     Status: Abnormal   Collection Time: 07/09/14  9:39 PM  Result Value Ref Range   Glucose-Capillary 236 (H) 70 - 99 mg/dL   Comment 1 Notify RN    Comment 2 Documented in Chart   Glucose, capillary     Status: Abnormal   Collection Time: 07/10/14  6:08 AM  Result Value Ref Range   Glucose-Capillary 227 (H) 70 - 99 mg/dL  Glucose, capillary     Status: Abnormal   Collection Time: 07/10/14 11:55 AM  Result Value Ref Range   Glucose-Capillary 217 (H) 70 - 99 mg/dL    Physical Findings: AIMS: Facial and Oral Movements Muscles of Facial Expression: None, normal Lips and Perioral Area: None, normal Jaw: None, normal Tongue: None, normal,Extremity Movements Upper (arms, wrists, hands, fingers): None, normal Lower (legs, knees, ankles, toes): None, normal, Trunk Movements Neck,  shoulders, hips: None, normal, Overall Severity Severity of abnormal movements (highest score from questions above): None, normal Incapacitation due to abnormal movements: None, normal Patient's awareness of abnormal movements (rate only patient's report): No Awareness, Dental Status Current problems with teeth and/or  dentures?: No Does patient usually wear dentures?: No  CIWA:    COWS:     Treatment Plan Summary: Daily contact with patient to assess and evaluate symptoms and progress in treatment Medication management  Plan/ Assessment: Patient is a 59 y old CM ,with no past hx of psychiatric diagnosis ,presented with SI ,with recent attempt to shoot self and multiple suicide notes to his ex wife and children. His major psychosocial stressor is his recent divorce with his ex wife and the fact that she has started seeing some one else.   Will increase Zoloft to 50 mg po daily. Discussed risks,benefits of medication.  Will increase Trazodone to 100  mg po qhs for sleep.  Will continue Vistaril prn for anxiety sx.   Will continue his Metformin/Insulin as scheduled. Patient has chronic pain issues and is on pain medications . Pt is on Oxycodone -reviewed [place] CONTROLLED SUBSTANCE DATABASE-Confirmed it.   Will continue to monitor vitals ,medication compliance and treatment side effects while patient is here.  Will monitor for medical issues as well as call consult as needed.  Reviewed labs ,will order as needed.  CSW will start working on disposition.  Patient to participate in therapeutic milieu . Plan to discharge tomorrow if he continues to be stable.      Medical Decision Making Problem Points:  Established problem, stable/improving (1), Review of last therapy session (1) and Review of psycho-social stressors (1) Data Points:  Review of medication regiment & side effects (2) Review of new medications or change in dosage (2)  I certify that inpatient services furnished can  reasonably be expected to improve the patient's condition.   Patricia Perales,MD 07/10/2014, 4:09 PM

## 2014-07-10 NOTE — Progress Notes (Signed)
Patient did attend the evening speaker AA meeting.  

## 2014-07-10 NOTE — Progress Notes (Signed)
Patient ID: William Schroeder, male   DOB: [birth year], 53 y.o.   MRN: [id number] He has been up and about interacting with peers and staff. He denies thoughts of SI. Has attended gorups. He has reqquested and received prn for pain this AM that was effective.

## 2014-07-10 NOTE — Progress Notes (Signed)
Patient ID: William Schroeder, male   DOB: 03/18/1961, 53 y.o.   MRN: [id number] D)  Meds were taken to pt as he had gone to bed while RN was doing an admission.

## 2014-07-10 NOTE — BHH Group Notes (Signed)
   [facility] LCSW Aftercare Discharge Planning Group Note  07/10/2014  8:45 AM   Participation Quality: Alert, Appropriate and Oriented  Mood/Affect: Appropriate  Depression Rating: 3-4  Anxiety Rating: 4-5  Thoughts of Suicide: Pt denies SI/HI  Will you contract for safety? Yes  Current AVH: Pt denies  Plan for Discharge/Comments: Pt attended discharge planning group and actively participated in group. CSW provided pt with today's workbook. Patient reports that he is feeling "okay" today. He reports feeling ready to discharge home and denies SI/HI at this time. Patient plans to return home at discharge to follow up with [facility] in [place].  Transportation Means: Pt reports access to transportation  Supports: No supports mentioned at this time  Tilden Fossa, MSW, [place] Social Worker [employer] [phone]

## 2014-07-11 LAB — GLUCOSE, CAPILLARY
GLUCOSE-CAPILLARY: 214 mg/dL — AB (ref 70–99)
GLUCOSE-CAPILLARY: 220 mg/dL — AB (ref 70–99)
Glucose-Capillary: 160 mg/dL — ABNORMAL HIGH (ref 70–99)
Glucose-Capillary: 260 mg/dL — ABNORMAL HIGH (ref 70–99)

## 2014-07-11 MED ORDER — SERTRALINE HCL 25 MG PO TABS
75.0000 mg | ORAL_TABLET | Freq: Every day | ORAL | Status: DC
  Administered 2014-07-12: 75 mg via ORAL
  Filled 2014-07-11: qty 9
  Filled 2014-07-11 (×2): qty 3

## 2014-07-11 MED ORDER — TRAZODONE HCL 100 MG PO TABS
100.0000 mg | ORAL_TABLET | Freq: Every day | ORAL | Status: DC
  Administered 2014-07-11: 100 mg via ORAL
  Filled 2014-07-11 (×2): qty 1
  Filled 2014-07-11: qty 3

## 2014-07-11 NOTE — Tx Team (Signed)
  Interdisciplinary Treatment Plan Update   Date Reviewed:  07/11/2014  Time Reviewed:  9:56 AM  Progress in Treatment:   Attending groups: Yes Participating in groups: Yes Taking medication as prescribed: Yes  Tolerating medication: Yes Family/Significant other contact made: Yes, CSW has spoken with patient's ex-wife. Patient understands diagnosis: Yes  Discussing patient identified problems/goals with staff: Yes  See initial care plan Medical problems stabilized or resolved: Yes Denies suicidal/homicidal ideation: Yes, denies Patient has not harmed self or others: Yes  For review of initial/current patient goals, please see plan of care.  Estimated Length of Stay:  Discharge anticipated for today 11/3.  Reason for Continuation of Hospitalization: Depression Medication stabilization Suicidal ideation  New Problems/Goals identified:  N/A  Discharge Plan or Barriers:   Pt to return home to follow up with [facility]. Discharge anticipated for today 07/11/14. Wife contacted CSW this morning and shared concerns that patient may not be stable for discharge. She is wanting to discuss the end of relationship with patient before he discharges.   Additional Comments:  William Schroeder is an 53 y.o. male presenting to ED with suicidal thoughts with planning, having written suicide notes to his ex-wife and children. Pt is alert and oriented times 4. Mood is depressed and anxious with congruent affect. Speech is logical and coherent, judgement partial. Pt denies HI, a/v hallucination, SA, self-harm, and abuse. Family hx is positive for substance abuse, mental health concerns and completed suicide.  Pt reports he has has mild depressive sx in the past, "Nothing major." Current depressive episode began when his divorce was finalized in June 2015 after 82 years of marriage. "It's not what I wanted." Reports he has been feeling suicidal. Last month he put a gun in his mouth, "I couldn't go  through with it." Pt's ex wife now has his guns. Pt reports he has been staying with his ex wife for several weeks because he does not feel safe. Pt endorses the following sx of depression: crying spells, hopelessness, loss of pleasure, loss of motivation, feeling guilty, and more irritable.   Attendees:  Patient:    Family:    Physician: Dr. Parke Poisson; Dr. Sabra Heck  07/11/2014 9:30 AM   Nursing: Grayland Ormond; Satira Sark; Luz Brazen, RN  07/11/2014 9:30 AM   Clinical Social Worker: Tilden Fossa, LCSWA  07/11/2014 9:30 AM   Other: Joette Catching, LCSW  07/11/2014 9:30 AM   Other: Lucinda Dell, [place] Liaison  07/11/2014 9:30 AM   Other:    Other: Edwyna Shell, LCSW  07/11/2014 9:30 AM   Other:    Other:    Other:    Other:    Other:      Tilden Fossa, MSW, [place] Worker [facility] [phone]

## 2014-07-11 NOTE — Progress Notes (Signed)
D   Pt is depressed and sad   He complains of chronic pain and frequently request pain medications   He interacts well with staff and peers and attends and participates in groups A   Verbal support given   Medications administered and effectiveness monitored   Q 15 min checks R   Pt safe at present and receptive to verbal support

## 2014-07-11 NOTE — Progress Notes (Signed)
D   Pt is approariate and pleasant  Her interacts well with staff and peers   He requested information about discharge procedures and expectations   He administered his own insulin and is knowledgeable of his diabetes , diet and insulin   A   Verbal support given  Medications administered and effectiveness monitored   Q 15 min checks R   Pt safe at present

## 2014-07-11 NOTE — Progress Notes (Signed)
Adult Psychoeducational Group Note  Date:  07/11/2014 Time:  11:18 PM  Group Topic/Focus:  Wrap-Up Group:   The focus of this group is to help patients review their daily goal of treatment and discuss progress on daily workbooks.  Participation Level:  Active  Participation Quality:  Appropriate, Attentive, Sharing and Supportive  Affect:  Appropriate  Cognitive:  Appropriate  Insight: Good  Engagement in Group:  Supportive  Modes of Intervention:  Support  Additional Comments:  Pt attended wrap up group this evening.  Pt reports using coping skills of humor and laughter today.  Pt reports family brought in chocolate for him he hid in sock.  MHT reminded pt of contraband rules on the unit and visitation rules.  Note, MHT found nothing in room during search.  Pt reports ate during visitation time earlier today.  Carlye Grippe, Aylee Littrell L 07/11/2014, 11:18 PM

## 2014-07-11 NOTE — Progress Notes (Signed)
Patient ID: William Schroeder, male   DOB: 22-Jun-1961, 53 y.o.   MRN: [id number] [facility] MD Progress Note  07/11/2014 10:48 AM William Schroeder  MRN:  [id number] Subjective: Patient states,' I am OK".   Objective: Pt seen and chart reviewed. Pt found in bed ,reports just felt like he wanted to sleep. Patient has a sad affect and appears to be withdrawn. Pt was admitted after trying to shoot self, his major stressor being his recent divorce in June 2015 after >30 years of marriage. Pt wants to cling on to the relationship ,however his wife has started seeing someone else. Pt seems to be in denial ,continues to think that his wife is going to work on their relationship. Patient had a visit from his wife yesterday ,states they are planning to take one day at a time. However per CSW ,who spoke to wife ,she would like to discuss with pt today that she is planning on leaving him for good.  Patient currently denies SI ,however has been withdrawn ,has poor appetite and has been isolating himself in his room. Patient has been minimizing symptoms. Patient has written multiple suicide notes to his family and attempted to shoot self with his gun prior to being admitted in the hospital.  Per nursing staff pt appears to be depressed ,not motivated to take his medications, isolates himself in his room. Pt needs a lot of encouragement to take his medications on a daily basis.  Per EHR -patient's sleep has been declining.  Diagnosis:   DSM5:  Primary Psychiatric Diagnosis:  Major depressive disorder,single episode ,severe without psychosis   Secondary psychiatric diagnosis:  Tobacco use disorder   Non Psychiatric Diagnosis:  See PMH    Total Time spent with patient: 30 minutes   ADL's:  Intact  Sleep: Fair  Appetite:  Fair   Psychiatric Specialty Exam: Physical Exam  ROS  Blood pressure 119/54, pulse 62, temperature 97.5 F (36.4 C), temperature source Oral, resp. rate 18, height 5\' 9"  (1.753 m),  weight 92.534 kg (204 lb), SpO2 94 %.Body mass index is 30.11 kg/(m^2).  General Appearance: Casual  Eye Contact::  Fair  Speech:  Clear and Coherent  Volume:  Normal  Mood:  Anxious and Depressed  Affect:  Depressed and Labile  Thought Process:  Coherent  Orientation:  Full (Time, Place, and Person)  Thought Content:  Rumination  Suicidal Thoughts:  No  Homicidal Thoughts:  No  Memory:  Immediate;   Fair Recent;   Fair Remote;   Fair  Judgement:  Fair  Insight:  Fair  Psychomotor Activity:  Normal  Concentration:  Fair  Recall:  [employer] of Knowledge:Good  Language: Good  Akathisia:  No  Handed:  Right  AIMS (if indicated):     Assets:  Communication Skills Desire for Improvement Housing Social Support  Sleep:  Number of Hours: 5.5   Musculoskeletal: Strength & Muscle Tone: within normal limits Gait & Station: normal Patient leans: N/A  Current Medications: Current Facility-Administered Medications  Medication Dose Route Frequency Provider Last Rate Last Dose  . acetaminophen (TYLENOL) tablet 650 mg  650 mg Oral Q6H PRN Ursula Alert, MD   650 mg at 07/10/14 0845  . alum & mag hydroxide-simeth (MAALOX/MYLANTA) 200-200-20 MG/5ML suspension 30 mL  30 mL Oral Q4H PRN Shellie Rogoff, MD      . feeding supplement (GLUCERNA SHAKE) (GLUCERNA SHAKE) liquid 237 mL  237 mL Oral TID BM Jasime Westergren, MD   237 mL  at 07/10/14 0842  . hydrocerin (EUCERIN) cream   Topical BID Ursula Alert, MD      . hydrOXYzine (ATARAX/VISTARIL) tablet 25 mg  25 mg Oral TID PRN Ursula Alert, MD   25 mg at 07/10/14 2314  . insulin aspart (novoLOG) injection 0-20 Units  0-20 Units Subcutaneous TID WC Benjamine Mola, FNP   15 Units at 07/10/14 1657  . insulin glargine (LANTUS) injection 62 Units  62 Units Subcutaneous QHS Benjamine Mola, FNP   62 Units at 07/10/14 2315  . lidocaine (LIDODERM) 5 % 1 patch  1 patch Transdermal Q24H Ursula Alert, MD   1 patch at 07/10/14 1203  . lisinopril  (PRINIVIL,ZESTRIL) tablet 20 mg  20 mg Oral Daily Ursula Alert, MD   20 mg at 07/11/14 0826  . magnesium hydroxide (MILK OF MAGNESIA) suspension 30 mL  30 mL Oral Daily PRN Ursula Alert, MD      . metFORMIN (GLUCOPHAGE-XR) 24 hr tablet 500 mg  500 mg Oral BID WC Benjamine Mola, FNP   500 mg at 07/11/14 0826  . MUSCLE RUB CREA   Topical PRN Ursula Alert, MD      . nicotine polacrilex (NICORETTE) gum 2 mg  2 mg Oral PRN Ursula Alert, MD   2 mg at 07/11/14 0824  . oxyCODONE (Oxy IR/ROXICODONE) immediate release tablet 15 mg  15 mg Oral Q8H PRN Ursula Alert, MD   15 mg at 07/10/14 2314  . [START ON 07/12/2014] sertraline (ZOLOFT) tablet 75 mg  75 mg Oral Q breakfast Marsa Matteo, MD      . simvastatin (ZOCOR) tablet 40 mg  40 mg Oral q1800 Ursula Alert, MD   40 mg at 07/10/14 1657  . traZODone (DESYREL) tablet 100 mg  100 mg Oral QHS Ursula Alert, MD        Lab Results:  Results for orders placed or performed during the hospital encounter of 07/06/14 (from the past 48 hour(s))  Glucose, capillary     Status: Abnormal   Collection Time: 07/09/14 11:36 AM  Result Value Ref Range   Glucose-Capillary 286 (H) 70 - 99 mg/dL   Comment 1 Notify RN   Glucose, capillary     Status: Abnormal   Collection Time: 07/09/14  4:53 PM  Result Value Ref Range   Glucose-Capillary 261 (H) 70 - 99 mg/dL   Comment 1 Notify RN   Glucose, capillary     Status: Abnormal   Collection Time: 07/09/14  9:39 PM  Result Value Ref Range   Glucose-Capillary 236 (H) 70 - 99 mg/dL   Comment 1 Notify RN    Comment 2 Documented in Chart   Glucose, capillary     Status: Abnormal   Collection Time: 07/10/14  6:08 AM  Result Value Ref Range   Glucose-Capillary 227 (H) 70 - 99 mg/dL  Glucose, capillary     Status: Abnormal   Collection Time: 07/10/14 11:55 AM  Result Value Ref Range   Glucose-Capillary 217 (H) 70 - 99 mg/dL  Glucose, capillary     Status: Abnormal   Collection Time: 07/10/14  4:39 PM  Result  Value Ref Range   Glucose-Capillary 304 (H) 70 - 99 mg/dL   Comment 1 Notify RN   Glucose, capillary     Status: Abnormal   Collection Time: 07/10/14  9:10 PM  Result Value Ref Range   Glucose-Capillary 179 (H) 70 - 99 mg/dL  Glucose, capillary     Status: Abnormal  Collection Time: 07/11/14  6:14 AM  Result Value Ref Range   Glucose-Capillary 160 (H) 70 - 99 mg/dL    Physical Findings: AIMS: Facial and Oral Movements Muscles of Facial Expression: None, normal Lips and Perioral Area: None, normal Jaw: None, normal Tongue: None, normal,Extremity Movements Upper (arms, wrists, hands, fingers): None, normal Lower (legs, knees, ankles, toes): None, normal, Trunk Movements Neck, shoulders, hips: None, normal, Overall Severity Severity of abnormal movements (highest score from questions above): None, normal Incapacitation due to abnormal movements: None, normal Patient's awareness of abnormal movements (rate only patient's report): No Awareness, Dental Status Current problems with teeth and/or dentures?: No Does patient usually wear dentures?: No  CIWA:    COWS:     Treatment Plan Summary: Daily contact with patient to assess and evaluate symptoms and progress in treatment Medication management  Plan/ Assessment: Patient is a 89 y old CM ,with no past hx of psychiatric diagnosis ,presented with SI ,with recent attempt to shoot self and multiple suicide notes to his ex wife and children. His major psychosocial stressor is his recent divorce with his ex wife and the fact that she has started seeing some one else.   Patient to have meeting with his wife today -to discuss future plans .Wife feels patient is not stable to be discharged and wants to discuss their future while patient is here in the hospital ,so that he gets help if he is in crisis.  Will increase Zoloft to 75 mg po daily. Discussed risks,benefits of medication.  Will increase Trazodone to 100  mg po qhs for sleep.  Will  continue Vistaril prn for anxiety sx.   Will continue his Metformin/Insulin as scheduled. Patient has chronic pain issues and is on pain medications . Pt is on Oxycodone -reviewed [place] CONTROLLED SUBSTANCE DATABASE-Confirmed it.   Will continue to monitor vitals ,medication compliance and treatment side effects while patient is here.  Will monitor for medical issues as well as call consult as needed.  Reviewed labs ,will order as needed.  CSW will start working on disposition.  Patient to participate in therapeutic milieu . Plan to discharge tomorrow if he continues to be stable.      Medical Decision Making Problem Points:  Established problem, stable/improving (1), Review of last therapy session (1) and Review of psycho-social stressors (1) Data Points:  Review of medication regiment & side effects (2) Review of new medications or change in dosage (2)  I certify that inpatient services furnished can reasonably be expected to improve the patient's condition.   Nadir Vasques,MD 07/11/2014, 10:48 AM

## 2014-07-11 NOTE — BHH Group Notes (Signed)
[place] LCSW Group Therapy  07/11/2014   1:15 PM   Type of Therapy:  Group Therapy  Participation Level:  Active  Participation Quality:  Attentive, Sharing and Supportive  Affect:  Depressed and Flat  Cognitive:  Alert and Oriented  Insight:  Developing/Improving and Engaged  Engagement in Therapy:  Developing/Improving and Engaged  Modes of Intervention:  Clarification, Confrontation, Discussion, Education, Exploration, Limit-setting, Orientation, Problem-solving, Rapport Building, Art therapist, Socialization and Support  Summary of Progress/Problems: The topic for group therapy was feelings about diagnosis.  Pt actively participated in group discussion on their past and current diagnosis and how they feel towards this.  Pt also identified how society and family members judge them, based on their diagnosis as well as stereotypes and stigmas.  Patient shared that he is worried about people at work finding out about his hospitalization. CSW's assisted patient in processing what that means to him. CSW's provided emotional support and encouragement.  Tilden Fossa, MSW, [place] Worker [facility] [phone]

## 2014-07-11 NOTE — Progress Notes (Signed)
Recreation Therapy Notes  Animal-Assisted Activity/Therapy (AAA/T) Program Checklist/Progress Notes Patient Eligibility Criteria Checklist & Daily Group note for Rec Tx Intervention  Date: 11.03.2015 Time: 2:45pm Location: 57 Hall Dayroom   AAA/T Program Assumption of Risk Form signed by Patient/ or Parent Legal Guardian yes  Patient is free of allergies or sever asthma yes  Patient reports no fear of animals yes  Patient reports no history of cruelty to animals yes   Patient understands his/her participation is voluntary yes  Patient washes hands before animal contact yes  Patient washes hands after animal contact yes  Behavioral Response: Engaged, Appropriate   Education: Contractor, Appropriate Animal Interaction   Education Outcome: Acknowledges education.   Clinical Observations/Feedback: Patient actively engaged with therapy dog, petting him appropriately and engaging with peers sharing stories about pets he has had in the past with group members. Patient asked appropriate questions about therapy dog and his training.   Laureen Ochs Chenelle Benning, LRT/CTRS  Avelardo Reesman L 07/11/2014 4:40 PM

## 2014-07-11 NOTE — BHH Group Notes (Signed)
The focus of this group is to educate the patient on the purpose and policies of crisis stabilization and provide a format to answer questions about their admission.  The group details unit policies and expectations of patients while admitted.  Patient did not attend 0900 nurse education orientation group this morning.  Patient stayed in his room.

## 2014-07-11 NOTE — Progress Notes (Signed)
Patient ID: William Schroeder, male   DOB: 05/15/1961, 53 y.o.   MRN: [id number] He has  Been up and about interacting with peers and staff . Has requested and received pr for pain today that was helpful. Self inventory: depression 5, hopelessness 5, anxiety 5, denies . He denies SI thoughts and withdrawals. Goal is to get better and go home.

## 2014-07-12 DIAGNOSIS — F332 Major depressive disorder, recurrent severe without psychotic features: Secondary | ICD-10-CM | POA: Insufficient documentation

## 2014-07-12 LAB — GLUCOSE, CAPILLARY
GLUCOSE-CAPILLARY: 207 mg/dL — AB (ref 70–99)
Glucose-Capillary: 124 mg/dL — ABNORMAL HIGH (ref 70–99)

## 2014-07-12 MED ORDER — SIMVASTATIN 40 MG PO TABS: 40.0000 mg | ORAL_TABLET | Freq: Every day | ORAL | Status: DC

## 2014-07-12 MED ORDER — SERTRALINE HCL 25 MG PO TABS: 75.0000 mg | ORAL_TABLET | Freq: Every day | ORAL | Status: DC

## 2014-07-12 MED ORDER — INSULIN LISPRO 100 UNIT/ML ~~LOC~~ SOLN: 14.0000 [IU] | Freq: Three times a day (TID) | SUBCUTANEOUS | Status: DC

## 2014-07-12 MED ORDER — LISINOPRIL 20 MG PO TABS: 20.0000 mg | ORAL_TABLET | Freq: Every day | ORAL | Status: DC

## 2014-07-12 MED ORDER — METFORMIN HCL ER 500 MG PO TB24: 500.0000 mg | ORAL_TABLET | Freq: Two times a day (BID) | ORAL | Status: DC

## 2014-07-12 MED ORDER — LIDOCAINE 5 % EX PTCH: 1.0000 | MEDICATED_PATCH | CUTANEOUS | Status: DC

## 2014-07-12 MED ORDER — TRAZODONE HCL 100 MG PO TABS: 100.0000 mg | ORAL_TABLET | Freq: Every day | ORAL | Status: DC

## 2014-07-12 MED ORDER — INSULIN GLARGINE 100 UNIT/ML ~~LOC~~ SOLN: 62.0000 [IU] | Freq: Every day | SUBCUTANEOUS | Status: DC

## 2014-07-12 MED ORDER — ESOMEPRAZOLE MAGNESIUM 20 MG PO CPDR: 20.0000 mg | DELAYED_RELEASE_CAPSULE | Freq: Every day | ORAL | Status: DC

## 2014-07-12 NOTE — Progress Notes (Signed)
Pt attended spiritual care group on grief and loss facilitated by counseling intern Martinique Austin and Jerene Pitch. Group opened with brief discussion and psycho-social ed around grief and loss in relationships and in relation to self - identifying life patterns, circumstances, changes that cause losses. Established group norm of speaking from own life experience. Group goal of establishing open and affirming space for members to share loss and experience with grief, normalize grief experience and provide psycho social education and grief support.  William Schroeder was present and appropriate in group. William Schroeder was quiet throughout group, but answered question of how do we respond to grief with it is based off of how we feel at the moment. William Schroeder lost sister in Feb., and shared regrets of things left unsaid before her passing; tension between wife and sister had impacted sibling relationship. Per shared how grief often can "hit you" out of nowhere and he's not prepared.  Martinique Austin Counseling Intern

## 2014-07-12 NOTE — Progress Notes (Signed)
Pt was discharged home today.  He denied any S/I H/I or A/V hallucinations.    He was given f/u appointment, rx, sample medications, and hotline info booklet.  He voiced understanding to all instructions provided.  He declined the need for smoking cessation materials.

## 2014-07-12 NOTE — Progress Notes (Signed)
[facility] Adult Case Management Discharge Plan :  Will you be returning to the same living situation after discharge: Yes,  patient will return to his home At discharge, do you have transportation home?:Yes,  patient's wife will provide transportation Do you have the ability to pay for your medications:Yes,  patient will be provided with prescriptions at discharge  Release of information consent forms completed and in the chart;  Patient's signature needed at discharge.  Patient to Follow up at: Follow-up Information    Follow up with [facility]  On 08/08/2014.   Why:  Appt. with Dr. Harrington Challenger for medication management at 10:00AM on this date. Make sure to complete your "New Patient Packet" and bring with you to this appt.    Contact information:   [id number] [place], [place] [address] Phone: [phone] Fax: x      Follow up with [facility] On 08/09/2014.   Why:  Appt. with Dr. Vernon Prey for therapy at 10:45AM on this date.    Contact information:   [id number] [place], [place] [address] Phone: [phone] Fax: x      Follow up with [facility] On 07/17/2014.   Why:  Appt for hospital follow-up with Dr. Quillian Quince at 1:15PM on this date.    Contact information:   [place], [place] [address] Phone: [phone] Fax: [phone]      Patient denies SI/HI:   Yes,  yes denies    Safety Planning and Suicide Prevention discussed:  Yes,  with patient and wife  Trevonte Ashkar, Casimiro Needle 07/12/2014, 12:22 PM

## 2014-07-12 NOTE — Clinical Social Work Note (Signed)
CSW met with patient to discuss possible discharge home. Patient shared that his wife came to see him last night to discuss their relationship. Patient reported feeling upset by the conversation but states that he feels safe to discharge home. Patient states that he could stay with his nephew for several days but is unsure at this time if he will. CSW encouraged patient to stay with nephew and his family for a few days for extra support, patient is considering. Patient denies SI or plan at this time and states that he would return to hospital if he felt suicidal. Wife reports that patient's guns have been removed from his home as a precaution. Patient reports that he is anxious to return to work. Patient agreeable to following up with [facility] [place], as well as his PCP Dr. Olena Heckle for follow up care. Patient reports that his wife will pick him up at discharge. CSW shared information with MD.   Tilden Fossa, MSW, [place] Worker [facility] [phone]

## 2014-07-12 NOTE — Discharge Summary (Signed)
Physician Discharge Summary Note  Patient:  William Schroeder is an 53 y.o., male MRN:  [id number] DOB:  [birth year] Patient phone:  [phone] (home)  Patient address:   [place] [address],  Total Time spent with patient: 30 minutes  Date of Admission:  07/06/2014 Date of Discharge: 07/12/14  Reason for Admission:  Depression, Suicidal thoughts   Discharge Diagnoses: Active Problems:   Depression   Major depressive disorder, single episode, severe without psychotic features   Tobacco use disorder, moderate, dependence   Major depressive disorder, recurrent, severe without psychotic features  Psychiatric Specialty Exam: Physical Exam  Review of Systems  Constitutional: Negative.   HENT: Negative.   Eyes: Negative.   Respiratory: Negative.   Cardiovascular: Negative.   Gastrointestinal: Negative.   Genitourinary: Negative.   Musculoskeletal: Negative.   Skin: Negative.   Neurological: Negative.   Endo/Heme/Allergies: Negative.   Psychiatric/Behavioral: Positive for depression (Stable with treatment ) and suicidal ideas (Stabilized with treatment ).    Blood pressure 94/57, pulse 86, temperature 97.5 F (36.4 C), temperature source Oral, resp. rate 18, height 5\' 9"  (1.753 m), weight 92.534 kg (204 lb), SpO2 94 %.Body mass index is 30.11 kg/(m^2).  See Physician SRA                                                  Past Psychiatric History: See H&P Diagnosis:  Hospitalizations:  Outpatient Care:  Substance Abuse Care:  Self-Mutilation:  Suicidal Attempts:  Violent Behaviors:   Musculoskeletal: Strength & Muscle Tone: within normal limits Gait & Station: normal Patient leans: N/A  DSM5:  Primary Psychiatric Diagnosis:  Major depressive disorder,single episode ,severe without psychosis (RESOLVING )  Secondary psychiatric diagnosis:  Tobacco use disorder   Non Psychiatric Diagnosis:  See PMH    Past Medical History   Diagnosis Date  . Hypertension   . Diabetes mellitus   . GERD (gastroesophageal reflux disease)     not on Nexium for years  . Arthritis   . Chronic leg pain     since last back surgery  . Hyperlipidemia   . Gastroparesis    Level of Care:  OP  Hospital Course:  William Schroeder is an 53 y.o. Caucasian male who presentedto [facility] with suicidal thoughts with plan to shoot self with his gun, having written suicide notes to his ex-wife and children.  Patient reports severe depression since the past few weeks,worsening since the past few days. Pt reports psychosocial stressor of his wife and him getting a divorce in June 2015. They were married for more than 35 years. Patient reports he does not know what happened, but they just grew apart. Patient reports that his ex wife currently has been staying with him since the past 3 weeks, since he asked her to. He wants them to be together again ,however she has been seeing someone  and its unlikely that they will get together again.Pt endorses sadness, anhedonia,sleep issues,being withdrawn,hopelessness as well as fatigue and SI . Patient reports trying shoot self in the past ,and also per ED notes has been writing suicide notes to his ex wife and children.Pt denies a past hx of depression or any other mental illness. Pt denies past hospitalizations in mental illness facilities.Pt denies past hx of substance abuse, except for the use of tobacco.  William Schroeder was admitted to the adult unit. He was evaluated and his symptoms were identified. Medication management was discussed and initiated. Patient was started on Zoloft for depression, which was titrated up to 75 mg daily by time of discharge. Patient was prescribed Trazodone 100 mg hs to help with insomnia.  He was oriented to the unit and encouraged to participate in unit programming. Medical problems were identified and treated appropriately. His medications for Diabetes  were continued. A Diabetic Consult was ordered for recommendations due to elevated blood sugars. His Lantus insulin was increased to 62 units at bedtime.  Home medication was restarted as needed.        The patient was evaluated each day by a clinical provider to ascertain the patient's response to treatment. Patient continued to show signs of depression including poor appetite and appeared withdrawn. He was fixated on getting back together with his wife. However, his wife informed him during hospital stay that she was not going to continue the relationship further. Although upset by the news the patient denied any suicidal intent to multiple staff members. He was encouraged to stay with family for a period of time to process his feelings.  His wife reported that all guns had already been removed from the home as a precaution. Patient reported looking forward to return to work.  Improvement in the patient's depression was noted by the patient's report of decreasing symptoms, improved sleep and appetite, affect, medication tolerance, behavior, and participation in unit programming.  He was asked each day to complete a self inventory noting mood, mental status, pain, new symptoms, anxiety and concerns.         He responded well to medication and being in a therapeutic and supportive environment. Positive and appropriate behavior was noted and the patient was motivated for recovery.  The patient worked closely with the treatment team and case manager to develop a discharge plan with appropriate goals. Coping skills, problem solving as well as relaxation therapies were also part of the unit programming.         By the day of discharge he was in much improved condition than upon admission.  Symptoms were reported as significantly decreased or resolved completely. The patient denied SI/HI and voiced no AVH. He was motivated to continue taking medication with a goal of continued improvement in mental health. William Schroeder was discharged home with a plan to follow up as noted below. Patient was provided with prescriptions and medication samples. He continued to deny an suicidal thoughts or intent at time of discharge from the hospital.   Consults:  None  Significant Diagnostic Studies:    Discharge Vitals:   Blood pressure 94/57, pulse 86, temperature 97.5 F (36.4 C), temperature source Oral, resp. rate 18, height 5\' 9"  (1.753 m), weight 92.534 kg (204 lb), SpO2 94 %. Body mass index is 30.11 kg/(m^2). Lab Results:   Results for orders placed or performed during the hospital encounter of 07/06/14 (from the past 72 hour(s))  Glucose, capillary     Status: Abnormal   Collection Time: 07/09/14 11:36 AM  Result Value Ref Range   Glucose-Capillary 286 (H) 70 - 99 mg/dL   Comment 1 Notify RN   Glucose, capillary     Status: Abnormal   Collection Time: 07/09/14  4:53 PM  Result Value Ref Range   Glucose-Capillary 261 (H) 70 - 99 mg/dL   Comment 1 Notify RN   Glucose, capillary  Status: Abnormal   Collection Time: 07/09/14  9:39 PM  Result Value Ref Range   Glucose-Capillary 236 (H) 70 - 99 mg/dL   Comment 1 Notify RN    Comment 2 Documented in Chart   Glucose, capillary     Status: Abnormal   Collection Time: 07/10/14  6:08 AM  Result Value Ref Range   Glucose-Capillary 227 (H) 70 - 99 mg/dL  Glucose, capillary     Status: Abnormal   Collection Time: 07/10/14 11:55 AM  Result Value Ref Range   Glucose-Capillary 217 (H) 70 - 99 mg/dL  Glucose, capillary     Status: Abnormal   Collection Time: 07/10/14  4:39 PM  Result Value Ref Range   Glucose-Capillary 304 (H) 70 - 99 mg/dL   Comment 1 Notify RN   Glucose, capillary     Status: Abnormal   Collection Time: 07/10/14  9:10 PM  Result Value Ref Range   Glucose-Capillary 179 (H) 70 - 99 mg/dL  Glucose, capillary     Status: Abnormal   Collection Time: 07/11/14  6:14 AM  Result Value Ref Range   Glucose-Capillary 160 (H) 70 - 99 mg/dL   Glucose, capillary     Status: Abnormal   Collection Time: 07/11/14 11:36 AM  Result Value Ref Range   Glucose-Capillary 214 (H) 70 - 99 mg/dL  Glucose, capillary     Status: Abnormal   Collection Time: 07/11/14  4:39 PM  Result Value Ref Range   Glucose-Capillary 260 (H) 70 - 99 mg/dL  Glucose, capillary     Status: Abnormal   Collection Time: 07/11/14  9:15 PM  Result Value Ref Range   Glucose-Capillary 220 (H) 70 - 99 mg/dL  Glucose, capillary     Status: Abnormal   Collection Time: 07/12/14  6:17 AM  Result Value Ref Range   Glucose-Capillary 124 (H) 70 - 99 mg/dL    Physical Findings: AIMS: Facial and Oral Movements Muscles of Facial Expression: None, normal Lips and Perioral Area: None, normal Jaw: None, normal Tongue: None, normal,Extremity Movements Upper (arms, wrists, hands, fingers): None, normal Lower (legs, knees, ankles, toes): None, normal, Trunk Movements Neck, shoulders, hips: None, normal, Overall Severity Severity of abnormal movements (highest score from questions above): None, normal Incapacitation due to abnormal movements: None, normal Patient's awareness of abnormal movements (rate only patient's report): No Awareness, Dental Status Current problems with teeth and/or dentures?: No Does patient usually wear dentures?: No  CIWA:    COWS:     Psychiatric Specialty Exam: See Psychiatric Specialty Exam and Suicide Risk Assessment completed by Attending Physician prior to discharge.  Discharge destination:  Home  Is patient on multiple antipsychotic therapies at discharge:  No   Has Patient had three or more failed trials of antipsychotic monotherapy by history:  No  Recommended Plan for Multiple Antipsychotic Therapies: NA  Discharge Instructions    Discharge instructions    Complete by:  As directed   Please see your Primary Care Provider for further management of chronic medical conditions. During your admission your Lantus insulin dosage was  increased to 62 unit sat bedtime to better manage your elevated blood sugars.            Medication List    STOP taking these medications        gabapentin 300 MG capsule  Commonly known as:  NEURONTIN     metFORMIN 1000 MG tablet  Commonly known as:  GLUCOPHAGE  Replaced by:  metFORMIN 500 MG  24 hr tablet     pioglitazone 15 MG tablet  Commonly known as:  ACTOS     ramipril 5 MG capsule  Commonly known as:  ALTACE      TAKE these medications      Indication   esomeprazole 20 MG capsule  Commonly known as:  NEXIUM  Take 1 capsule (20 mg total) by mouth daily at 12 noon. 1 po qd   Indication:  Heartburn     insulin glargine 100 UNIT/ML injection  Commonly known as:  LANTUS  Inject 0.62 mLs (62 Units total) into the skin at bedtime.   Indication:  Type 2 Diabetes     insulin lispro 100 UNIT/ML injection  Commonly known as:  HUMALOG  Inject 0.14 mLs (14 Units total) into the skin 3 (three) times daily before meals.   Indication:  Type 2 Diabetes     lidocaine 5 %  Commonly known as:  LIDODERM  Place 1 patch onto the skin daily. Remove & Discard patch within 12 hours or as directed by MD   Indication:  Chronic pain     lisinopril 20 MG tablet  Commonly known as:  PRINIVIL,ZESTRIL  Take 1 tablet (20 mg total) by mouth daily.   Indication:  High Blood Pressure     metFORMIN 500 MG 24 hr tablet  Commonly known as:  GLUCOPHAGE-XR  Take 1 tablet (500 mg total) by mouth 2 (two) times daily with a meal.   Indication:  Type 2 Diabetes     oxyCODONE 15 MG immediate release tablet  Commonly known as:  ROXICODONE  Take 15 mg by mouth 3 (three) times daily as needed for pain.      sertraline 25 MG tablet  Commonly known as:  ZOLOFT  Take 3 tablets (75 mg total) by mouth daily with breakfast.   Indication:  Major Depressive Disorder     simvastatin 40 MG tablet  Commonly known as:  ZOCOR  Take 1 tablet (40 mg total) by mouth daily.   Indication:  Nonfamilial  Heterozygous Hypercholesterolemia     traZODone 100 MG tablet  Commonly known as:  DESYREL  Take 1 tablet (100 mg total) by mouth at bedtime.   Indication:  Trouble Sleeping           Follow-up Information    Follow up with [facility]  On 08/08/2014.   Why:  Appt. with Dr. Harrington Challenger for medication management at 10:00AM on this date. Make sure to complete your "New Patient Packet" and bring with you to this appt.    Contact information:   [id number] [place], [place] [address] Phone: [phone] Fax: x      Follow up with [facility] On 08/09/2014.   Why:  Appt. with Dr. Vernon Prey for therapy at 10:45AM on this date.    Contact information:   [id number] [place], [place] [address] Phone: [phone] Fax: x      Follow up with [facility] On 07/17/2014.   Why:  Appt for hospital follow-up with Dr. Quillian Quince at 1:15PM on this date.    Contact information:   [place], [place] [address] Phone: [phone] Fax: [phone]      Follow-up recommendations:   Activity: no restrictions Diet: diabetic diet  Comments:   Take all your medications as prescribed by your mental healthcare provider.  Report any adverse effects and or reactions from your medicines to your  outpatient provider promptly.  Patient is instructed and cautioned to not engage in alcohol and or illegal drug use while on prescription medicines.  In the event of worsening symptoms, patient is instructed to call the crisis hotline, 911 and or go to the nearest ED for appropriate evaluation and treatment of symptoms.  Follow-up with your primary care provider for your other medical issues, concerns and or health care needs.   Total Discharge Time:  Greater than 30 minutes.  SignedElmarie Shiley NP-C 07/12/2014, 11:35 AM

## 2014-07-12 NOTE — BHH Group Notes (Signed)
   [facility] LCSW Aftercare Discharge Planning Group Note  07/12/2014  8:45 AM   Participation Quality: Alert, Appropriate and Oriented  Mood/Affect: Depressed and Flat  Depression Rating: 4  Anxiety Rating: 6  Thoughts of Suicide: Pt denies SI/HI  Will you contract for safety? Yes  Current AVH: Pt denies  Plan for Discharge/Comments: Pt attended discharge planning group and actively participated in group. CSW provided pt with today's workbook. Patient reports that he feels ready to discharge home today. He denies SI. He plans to return home to follow up with [facility] in [place].   Transportation Means: Pt reports access to transportation  Supports: Patient identified his ex-wife and nephew as supports  Tilden Fossa, MSW, [place] Worker [employer] [phone]

## 2014-07-12 NOTE — BHH Group Notes (Addendum)
[facility] LCSW Group Therapy 07/12/2014  1:15 PM Type of Therapy: Group Therapy Participation Level: Active  Participation Quality: Attentive and Supportive  Affect: Depressed and Flat  Cognitive: Alert and Oriented  Insight: Developing/Improving and Engaged  Engagement in Therapy: Developing/Improving and Engaged  Modes of Intervention: Clarification, Confrontation, Discussion, Education, Exploration, Limit-setting, Orientation, Problem-solving, Rapport Building, Art therapist, Socialization and Support  Summary of Progress/Problems: The topic for group today was emotional regulation. This group focused on both positive and negative emotion identification and allowed group members to process ways to identify feelings, regulate negative emotions, and find healthy ways to manage internal/external emotions. Group members were asked to reflect on a time when their reaction to an emotion led to a negative outcome and explored how alternative responses using emotion regulation would have benefited them. Group members were also asked to discuss a time when emotion regulation was utilized when a negative emotion was experienced. Patient actively listened during group but did not want to share on topic.  Tilden Fossa, MSW, [place] Worker [facility] [phone]

## 2014-07-12 NOTE — Progress Notes (Signed)
Adult Psychoeducational Group Note  Date:  07/12/2014 Time:  6:51 PM  Group Topic/Focus:  Wellness Toolbox:   The focus of this group is to discuss various aspects of wellness, balancing those aspects and exploring ways to increase the ability to experience wellness.  Patients will create a wellness toolbox for use upon discharge.  Participation Level:  None  Additional Comments: Patient attended group but did not participate because he said he was being discharged too.  Oralia Manis 07/12/2014, 6:51 PM
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
## 2014-07-17 NOTE — Progress Notes (Signed)
Patient Discharge Instructions:  After Visit Summary (AVS):   Faxed to:  07/17/14 Discharge Summary Note:   Faxed to:  07/17/14 Psychiatric Admission Assessment Note:   Faxed to:  07/17/14 Suicide Risk Assessment - Discharge Assessment:   Faxed to:  07/17/14 Faxed/Sent to the Next Level Care provider:  07/17/14 Next Level Care Provider Has Access to the EMR, 07/17/14  Faxed to [facility] @ [phone] Records provided to [facility] via [place], 07/17/2014, 2:49 PM

## 2014-08-08 ENCOUNTER — Ambulatory Visit (HOSPITAL_COMMUNITY): Payer: Self-pay | Admitting: Psychiatry

## 2014-08-09 ENCOUNTER — Ambulatory Visit (INDEPENDENT_AMBULATORY_CARE_PROVIDER_SITE_OTHER): Payer: BC Managed Care – PPO | Admitting: Psychology

## 2014-08-09 DIAGNOSIS — F322 Major depressive disorder, single episode, severe without psychotic features: Secondary | ICD-10-CM

## 2014-08-17 ENCOUNTER — Encounter (HOSPITAL_COMMUNITY): Payer: Self-pay | Admitting: Psychology
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
## 2014-09-11 ENCOUNTER — Encounter (HOSPITAL_COMMUNITY): Payer: Self-pay | Admitting: Psychology

## 2014-09-11 ENCOUNTER — Ambulatory Visit (INDEPENDENT_AMBULATORY_CARE_PROVIDER_SITE_OTHER): Payer: BC Managed Care – HMO | Admitting: Psychology

## 2014-09-11 DIAGNOSIS — F322 Major depressive disorder, single episode, severe without psychotic features: Secondary | ICD-10-CM

## 2014-09-11 NOTE — Progress Notes (Signed)
     Patient:  William Schroeder   DOB: 26-Sep-1960  MR Number: [id number]  Location: [facility] [address] [place] [address] Dept: [phone]  Start: 3 PM End: 4 PM  Provider/Observer:     Edgardo Roys PSYD  Chief Complaint:      Chief Complaint  Patient presents with  . Depression    Reason For Service:     The patient was referred by the inpatient unit for psychological care and psychiatric follow-up. The patient is a 54 year old Caucasian male who was hospitalized after a suicide attempt on November 28. The patient reports that he put a 45 caliber handgun into his mouth reports that she tried to pull the trigger but it did not fire. Prior to doing this he had told his wife that she should move on. The patient reports that he and his wife/ex-wife have been having a lot of problems recently. He reports that both of them had been dating other people. They've been married for 35 years and have 2 kids. The patient's wife was also present for the first session and she reports that they have been married for 35 years but the marriage was not good. 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 The patient reports that his wife had numerous personal losses in 2013 and that it had a significant impact on his wife and she developed severe depression. The patient reports that as of now he and his wife have both stopped dating the other people they've been dating her trying to work together if they can. The patient reports that he is not currently suicidal but has had suicidal ideation in the past.  Interventions Strategy:  Cognitive/behavioral psychotherapeutic interventions  Participation Level:   Active  Participation Quality:  Appropriate      Behavioral Observation:  Well Groomed, Alert, and Appropriate.   Current Psychosocial Factors: The patient reports that he  has continued to struggle with trying to reestablish the relationship with his wife.  He reports that he continues to try with this situation.  Content of Session:   Reviewed current symptoms and work on therapeutic interventions to begin trying to deal with the major depressive episode.  Current Status:   The patient reports that he is not suicidal at this point but does continue to experience significant depression.  Patient Progress:   Stable  Target Goals:   Target goals include reducing the intensity, severity, and frequency of depressive symptoms.  Last Reviewed:   09/11/2014  Goals Addressed Today:    Today we worked on trying to build coping skills both behaviorally and cognitively around issues of recurrent depression in building better coping skills around relationship issues with his wife.  Impression/Diagnosis:   The patient has a history of prior depression but more recently developed severe depression with suicidal ideation and suicidal gesture. The patient suicidal gesture was serious. He was hospitalized. Currently he denies any active suicidal ideation.  The patient contracts for safety and has been well informed of what to do if he develops suicidal ideation and a plan in the future.  Diagnosis:    Axis I: Major depressive disorder, single episode, severe without psychotic features     RODENBOUGH,JOHN R, PsyD 09/11/2014

## 2014-09-14 ENCOUNTER — Ambulatory Visit (INDEPENDENT_AMBULATORY_CARE_PROVIDER_SITE_OTHER): Payer: BLUE CROSS/BLUE SHIELD | Admitting: Psychiatry

## 2014-09-14 ENCOUNTER — Encounter (HOSPITAL_COMMUNITY): Payer: Self-pay | Admitting: Psychiatry

## 2014-09-14 VITALS — BP 163/103 | HR 98 | Ht 69.0 in | Wt 202.0 lb

## 2014-09-14 DIAGNOSIS — F332 Major depressive disorder, recurrent severe without psychotic features: Secondary | ICD-10-CM

## 2014-09-14 DIAGNOSIS — F324 Major depressive disorder, single episode, in partial remission: Secondary | ICD-10-CM

## 2014-09-14 DIAGNOSIS — F1910 Other psychoactive substance abuse, uncomplicated: Secondary | ICD-10-CM

## 2014-09-14 MED ORDER — DULOXETINE HCL 60 MG PO CPEP: 60.0000 mg | ORAL_CAPSULE | Freq: Every day | ORAL | Status: DC

## 2014-09-14 NOTE — Progress Notes (Signed)
Psychiatric Assessment Adult  Patient Identification:  William Schroeder Date of Evaluation:  09/14/2014 Chief Complaint: I've been depressed History of Chief Complaint:   Chief Complaint  Patient presents with  . Depression  . Establish Care    HPI this patient is a 54 year old divorced white male who lives alone in [place]. He has a grown son and daughter. He works as a Freight forwarder at [employer].  The patient was referred by behavioral health hospital after he was hospitalized in November for suicidal ideation with a plan to shoot himself with a gun.  The patient states that he had been depressed for about 2 years but had gotten worse since June 2015 when his divorce became final and he and his wife began living apart.. He got more and more despondent and found himself with a gun in his mouth one night. He asked his ex-wife for help and she brought him to the hospital. He was started on Cymbalta which seems to been helpful. Shortly after this hospitalization he elected [facility] put [facility] [place]. He states that he has been using narcotics particular oxycodone 15 mg 3 times a day for back pain for about 2 years and it was really affecting his thinking and cognition.  The patient reports that his managers at work stated that he had to get treatment because he was making poor decisions and acting "loopy". He also thinks that the narcotic use affected his marriage. He has now been off narcotics entirely for several weeks. He is supposed to also join in and a group and has started counseling here with [facility].  The patient states he is doing better now. He still hopes that he has wife would get back together but she has not indicated that this will necessarily happen. They have stayed close friends. He still is depressed at times but not as much as before. He has some difficulty sleeping but has forgotten to take the trazodone most of the time and states that he  will try to do better. He works 50-60 hours per week and enjoys his work. He used to enjoy skeet shooting but his family removed all his guns after the suicide attempt. He states that his memory is "shot" and he is not sure why but it may be due to the narcotic use. He used to enjoy listening to songs and singing them but now he can't remember the words. He denies anxiety or panic and denies any current auditory or visual hallucinations paranoia or further suicidal ideation. His diet is extremely poor particular given that he is a diabetic. He's lost 100 pounds in the last 3 years and is very scared to regain it. However he often will only eat at diet soda and a breakfast pastry for the entire day. His blood sugar and A1c are very much out of control. He is followed by direct Dr. Quillian Quince for this Review of Systems  Constitutional: Positive for appetite change.  HENT: Negative.   Eyes: Negative.   Respiratory: Negative.   Cardiovascular: Negative.   Gastrointestinal: Positive for abdominal pain.  Endocrine: Negative.   Genitourinary: Negative.   Musculoskeletal: Positive for back pain.  Skin: Negative.   Allergic/Immunologic: Negative.   Neurological: Negative.   Hematological: Negative.   Psychiatric/Behavioral: Positive for sleep disturbance and dysphoric mood.   Physical Exam not done  Depressive Symptoms: depressed mood, anhedonia, insomnia, psychomotor retardation, impaired memory, weight loss,  (Hypo) Manic Symptoms:  Elevated Mood:  No Irritable Mood:  No Grandiosity:  No Distractibility:  No Labiality of Mood:  No Delusions:  No Hallucinations:  No Impulsivity:  No Sexually Inappropriate Behavior:  No Financial Extravagance:  No Flight of Ideas:  No  Anxiety Symptoms: Excessive Worry:  Yes Panic Symptoms:  No Agoraphobia:  No Obsessive Compulsive: No  Symptoms: None, Specific Phobias:  No Social Anxiety:  No  Psychotic Symptoms:  Hallucinations: No  None Delusions:  No Paranoia:  No   Ideas of Reference:  No  PTSD Symptoms: Ever had a traumatic exposure:  No Had a traumatic exposure in the last month:  No Re-experiencing: No None Hypervigilance:  No Hyperarousal: No None Avoidance: No None  Traumatic Brain Injury: No   Past Psychiatric History: Diagnosis: Major depression   Hospitalizations: [facility] 11/15  Outpatient Care: none  Substance Abuse Care: [facility] in [place] last month   Self-Mutilation: none  Suicidal Attempts: Prior to hospitalization in November   Violent Behaviors: none   Past Medical History:   Past Medical History  Diagnosis Date  . Hypertension   . Diabetes mellitus   . GERD (gastroesophageal reflux disease)     not on Nexium for years  . Arthritis   . Chronic leg pain     since last back surgery  . Hyperlipidemia   . Gastroparesis    History of Loss of Consciousness:  No Seizure History:  No Cardiac History:  No Allergies:  No Known Allergies Current Medications:  Current Outpatient Prescriptions  Medication Sig Dispense Refill  . DULoxetine (CYMBALTA) 60 MG capsule Take 1 capsule (60 mg total) by mouth daily. 30 capsule 2  . gabapentin (NEURONTIN) 300 MG capsule Take by mouth. Taking 1 to 2 Capsules Three Times Daily    . insulin glargine (LANTUS) 100 UNIT/ML injection Inject 0.62 mLs (62 Units total) into the skin at bedtime. 10 mL 11  . lisinopril (PRINIVIL,ZESTRIL) 20 MG tablet Take 1 tablet (20 mg total) by mouth daily.    [place] Kitchen omeprazole (PRILOSEC) 20 MG capsule Take 20 mg by mouth daily.    . ondansetron (ZOFRAN) 4 MG tablet Take 4 mg by mouth daily.    . traZODone (DESYREL) 100 MG tablet Take 1 tablet (100 mg total) by mouth at bedtime. (Patient taking differently: Take 100 mg by mouth at bedtime as needed. ) 30 tablet 0  . insulin lispro (HUMALOG) 100 UNIT/ML injection Inject 0.14 mLs (14 Units total) into the skin 3 (three) times daily before meals. (Patient not  taking: Reported on 09/14/2014) 10 mL 11   No current facility-administered medications for this visit.    Previous Psychotropic Medications:  Medication Dose                          Substance Abuse History in the last 12 months: Substance Age of 1st Use Last Use Amount Specific Type  Nicotine      Alcohol      Cannabis      Opiates    was using oxycodone 15 mg 3 times a day    Cocaine      Methamphetamines      LSD      Ecstasy      Benzodiazepines      Caffeine      Inhalants      Others:  Medical Consequences of Substance Abuse: Confusion, erratic behavior  Legal Consequences of Substance Abuse: none  Family Consequences of Substance Abuse: May have contributed to break up of marriage  Blackouts:  No DT's:  No Withdrawal Symptoms:  Yes Diaphoresis Nausea Tremors Vomiting  Social History: Current Place of Residence: [place] of Birth: [place] Family Members: 3 sisters, 2 brothers Marital Status:  Divorced Children:   Sons: 1  Daughters:1 Relationships:  Education:  HS Soil scientist Problems/Performance:  Religious Beliefs/Practices: Christian History of Abuse: none Pensions consultant; saw Training and development officer History:  Dispensing optician History: none Hobbies/Interests: Singing, skeet shooting  Family History:   Family History  Problem Relation Age of Onset  . Colon cancer Other     uncle  . Pancreatic cancer Sister   . Depression Sister   . Pancreatic cancer Brother   . Depression Brother   . Drug abuse Brother   . Diabetes Mother   . Heart disease Mother   . Kidney disease Mother   . Diabetes Other     9 siblings  . Alcohol abuse Brother     Mental Status Examination/Evaluation: Objective:  Appearance: Casual and Fairly Groomed  Engineer, water::  Fair  Speech:  Slow  Volume:  Decreased  Mood:  Mildly depressed   Affect:  Constricted  Thought Process:  Goal Directed   Orientation:  Full (Time, Place, and Person)  Thought Content:  Rumination  Suicidal Thoughts:  No  Homicidal Thoughts:  No  Judgement:  Fair  Insight:  Fair  Psychomotor Activity:  Normal  Akathisia:  No  Handed:  Right  AIMS (if indicated):    Assets:  Communication Skills Desire for Improvement Resilience Social Support Talents/Skills    Laboratory/X-Ray Psychological Evaluation(s)   Hospital labs are reviewed and indicate poorly controlled diabetes      Assessment:  Axis I: Major Depression, Recurrent severe and Substance Abuse  AXIS I Major Depression, Recurrent severe and Substance Abuse  AXIS II Deferred  AXIS III Past Medical History  Diagnosis Date  . Hypertension   . Diabetes mellitus   . GERD (gastroesophageal reflux disease)     not on Nexium for years  . Arthritis   . Chronic leg pain     since last back surgery  . Hyperlipidemia   . Gastroparesis      AXIS IV problems with primary support group  AXIS V 51-60 moderate symptoms   Treatment Plan/Recommendations:  Plan of Care: Medication management   Laboratory:  Psychotherapy: He is seeing Dr. Jefm Miles here   Medications: He will continue Cymbalta 60 mg daily. He's encouraged to use trazodone 100 mg daily at bedtime to help with sleep   Routine PRN Medications:  No  Consultations:   Safety Concerns:  He denies thoughts of self-harm today   Other: He will return in 4 weeks     Levonne Spiller, MD 1/7/20163:18 PM

## 2014-10-06 ENCOUNTER — Encounter (HOSPITAL_COMMUNITY): Payer: Self-pay | Admitting: Psychology

## 2014-10-06 ENCOUNTER — Ambulatory Visit (INDEPENDENT_AMBULATORY_CARE_PROVIDER_SITE_OTHER): Payer: BLUE CROSS/BLUE SHIELD | Admitting: Psychology

## 2014-10-06 DIAGNOSIS — F324 Major depressive disorder, single episode, in partial remission: Secondary | ICD-10-CM

## 2014-10-06 NOTE — Progress Notes (Signed)
      Patient:  William Schroeder   DOB: 10-04-1960  MR Number: [id number]  Location: [facility] [address] [place] [address] Dept: [phone]  Start: 9 AM End: 10 AM  Provider/Observer:     Edgardo Roys PSYD  Chief Complaint:      Chief Complaint  Patient presents with  . Depression    Reason For Service:     The patient was referred by the inpatient unit for psychological care and psychiatric follow-up. The patient is a 54 year old Caucasian male who was hospitalized after a suicide attempt on November 28. The patient reports that he put a 45 caliber handgun into his mouth reports that she tried to pull the trigger but it did not fire. Prior to doing this he had told his wife that she should move on. The patient reports that he and his wife/ex-wife have been having a lot of problems recently. He reports that both of them had been dating other people. They've been married for 35 years and have 2 kids. The patient's wife was also present for the first session and she reports that they have been married for 35 years but the marriage was not good. 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 The patient reports that his wife had numerous personal losses in 2013 and that it had a significant impact on his wife and he developed severe depression. The patient reports that as of now he and his wife have both stopped dating the other people they've been dating her trying to work together if they can. The patient reports that he is not currently suicidal but has had suicidal ideation in the past.  Interventions Strategy:  Cognitive/behavioral psychotherapeutic interventions  Participation Level:   Active  Participation Quality:  Appropriate      Behavioral Observation:  Well Groomed, Alert, and Appropriate.   Current Psychosocial Factors: The patient reports that the  relationship with wife has gotten better and he is doing things to help her out and try to make her life better.    Content of Session:   Reviewed current symptoms and work on therapeutic interventions to begin trying to deal with the major depressive episode.  Current Status:   The patient reports that he is doing much better with his mood and that the relationship has improved a great deal.  Patient Progress:   Stable  Target Goals:   Target goals include reducing the intensity, severity, and frequency of depressive symptoms.  Last Reviewed:   10/06/2014  Goals Addressed Today:    Today we worked on trying to build coping skills both behaviorally and cognitively around issues of recurrent depression in building better coping skills around relationship issues with his wife.  Impression/Diagnosis:   The patient has a history of prior depression but more recently developed severe depression with suicidal ideation and suicidal gesture. The patient suicidal gesture was serious. He was hospitalized. Currently he denies any active suicidal ideation.  The patient contracts for safety and has been well informed of what to do if he develops suicidal ideation and a plan in the future.  Diagnosis:    Axis I: Major depression single episode, in partial remission     RODENBOUGH,JOHN R, PsyD 10/06/2014

## 2014-10-13 ENCOUNTER — Ambulatory Visit (INDEPENDENT_AMBULATORY_CARE_PROVIDER_SITE_OTHER): Payer: BLUE CROSS/BLUE SHIELD | Admitting: Psychiatry

## 2014-10-13 ENCOUNTER — Encounter (HOSPITAL_COMMUNITY): Payer: Self-pay | Admitting: Psychiatry

## 2014-10-13 VITALS — BP 155/94 | HR 84 | Ht 70.0 in | Wt 200.0 lb

## 2014-10-13 DIAGNOSIS — F1919 Other psychoactive substance abuse with unspecified psychoactive substance-induced disorder: Secondary | ICD-10-CM | POA: Diagnosis not present

## 2014-10-13 DIAGNOSIS — F324 Major depressive disorder, single episode, in partial remission: Secondary | ICD-10-CM

## 2014-10-13 MED ORDER — DULOXETINE HCL 60 MG PO CPEP: 60.0000 mg | ORAL_CAPSULE | Freq: Every day | ORAL | Status: DC

## 2014-10-13 NOTE — Progress Notes (Signed)
Patient ID: William Schroeder, male   DOB: 04/23/1961, 54 y.o.   MRN: [id number]  Psychiatric Assessment Adult  Patient Identification:  William Schroeder Date of Evaluation:  10/13/2014 Chief Complaint: I've been depressed History of Chief Complaint:   Chief Complaint  Patient presents with  . Depression  . Anxiety  . Follow-up    Anxiety     this patient is a 54 year old divorced white male who lives alone in [place]. He has a grown son and daughter. He works as a Freight forwarder at [employer].  The patient was referred by behavioral health hospital after he was hospitalized in November for suicidal ideation with a plan to shoot himself with a gun.  The patient states that he had been depressed for about 2 years but had gotten worse since June 2015 when his divorce became final and he and his wife began living apart.. He got more and more despondent and found himself with a gun in his mouth one night. He asked his ex-wife for help and she brought him to the hospital. He was started on Cymbalta which seems to been helpful. Shortly after this hospitalization he elected [facility] [place] He states that he has been using narcotics particular oxycodone 15 mg 3 times a day for back pain for about 2 years and it was really affecting his thinking and cognition.  The patient reports that his managers at work stated that he had to get treatment because he was making poor decisions and acting "loopy". He also thinks that the narcotic use affected his marriage. He has now been off narcotics entirely for several weeks. He is supposed to also join in and a group and has started counseling here with [facility].  The patient states he is doing better now. He still hopes that he has wife would get back together but she has not indicated that this will necessarily happen. They have stayed close friends. He still is depressed at times but not as much as before. He has some  difficulty sleeping but has forgotten to take the trazodone most of the time and states that he will try to do better. He works 50-60 hours per week and enjoys his work. He used to enjoy skeet shooting but his family removed all his guns after the suicide attempt. He states that his memory is "shot" and he is not sure why but it may be due to the narcotic use. He used to enjoy listening to songs and singing them but now he can't remember the words. He denies anxiety or panic and denies any current auditory or visual hallucinations paranoia or further suicidal ideation. His diet is extremely poor particular given that he is a diabetic. He's lost 100 pounds in the last 3 years and is very scared to regain it. However he often will only eat at diet soda and a breakfast pastry for the entire day. His blood sugar and A1c are very much out of control. He is followed byDr. Quillian Quince for this  The patient returns after four-week's. He states that he is doing fairly well. His mood has been good. He and his wife are still talking and he's hoping that they may be able to get back together. He has not come back to use of narcotics or any other illicit drugs. His blood sugar is still not well managed and he only takes insulin at night even though he supposed to take  Lantus insulin through the day. He eats whatever he wants to eat. The fact that his mother died from renal failure due to diabetes doesn't seem to have much effect on his thinking. He is not using trazodone at night and sleeps okay. He is afraid it would keep him too groggy to wake up for work. He denies suicidal ideation Review of Systems  Constitutional: Positive for appetite change.  HENT: Negative.   Eyes: Negative.   Respiratory: Negative.   Cardiovascular: Negative.   Gastrointestinal: Positive for abdominal pain.  Endocrine: Negative.   Genitourinary: Negative.   Musculoskeletal: Positive for back pain.  Skin: Negative.   Allergic/Immunologic:  Negative.   Neurological: Negative.   Hematological: Negative.   Psychiatric/Behavioral: Positive for sleep disturbance and dysphoric mood.   Physical Exam not done  Depressive Symptoms: depressed mood, anhedonia, insomnia, psychomotor retardation, impaired memory, weight loss,  (Hypo) Manic Symptoms:   Elevated Mood:  No Irritable Mood:  No Grandiosity:  No Distractibility:  No Labiality of Mood:  No Delusions:  No Hallucinations:  No Impulsivity:  No Sexually Inappropriate Behavior:  No Financial Extravagance:  No Flight of Ideas:  No  Anxiety Symptoms: Excessive Worry:  Yes Panic Symptoms:  No Agoraphobia:  No Obsessive Compulsive: No  Symptoms: None, Specific Phobias:  No Social Anxiety:  No  Psychotic Symptoms:  Hallucinations: No None Delusions:  No Paranoia:  No   Ideas of Reference:  No  PTSD Symptoms: Ever had a traumatic exposure:  No Had a traumatic exposure in the last month:  No Re-experiencing: No None Hypervigilance:  No Hyperarousal: No None Avoidance: No None  Traumatic Brain Injury: No   Past Psychiatric History: Diagnosis: Major depression   Hospitalizations: [facility] 11/15  Outpatient Care: none  Substance Abuse Care: [facility] in [place] last month   Self-Mutilation: none  Suicidal Attempts: Prior to hospitalization in November   Violent Behaviors: none   Past Medical History:   Past Medical History  Diagnosis Date  . Hypertension   . Diabetes mellitus   . GERD (gastroesophageal reflux disease)     not on Nexium for years  . Arthritis   . Chronic leg pain     since last back surgery  . Hyperlipidemia   . Gastroparesis    History of Loss of Consciousness:  No Seizure History:  No Cardiac History:  No Allergies:  No Known Allergies Current Medications:  Current Outpatient Prescriptions  Medication Sig Dispense Refill  . DULoxetine (CYMBALTA) 60 MG capsule Take 1 capsule (60 mg total) by mouth daily.  30 capsule 2  . gabapentin (NEURONTIN) 300 MG capsule Take by mouth. Taking 2 Tablets in AM and 1 Tablet in PM and 2 Tablets at night    . insulin glargine (LANTUS) 100 UNIT/ML injection Inject 0.62 mLs (62 Units total) into the skin at bedtime. 10 mL 11  . lisinopril (PRINIVIL,ZESTRIL) 20 MG tablet Take 1 tablet (20 mg total) by mouth daily.    Marland Kitchen omeprazole (PRILOSEC) 20 MG capsule Take 20 mg by mouth daily.    . ondansetron (ZOFRAN) 4 MG tablet Take 4 mg by mouth daily.    . insulin lispro (HUMALOG) 100 UNIT/ML injection Inject 0.14 mLs (14 Units total) into the skin 3 (three) times daily before meals. (Patient not taking: Reported on 09/14/2014) 10 mL 11  . traZODone (DESYREL) 100 MG tablet Take 1 tablet (100 mg total) by mouth at bedtime. (Patient not taking: Reported on 10/13/2014) 30 tablet  0   No current facility-administered medications for this visit.    Previous Psychotropic Medications:  Medication Dose                          Substance Abuse History in the last 12 months: Substance Age of 1st Use Last Use Amount Specific Type  Nicotine      Alcohol      Cannabis      Opiates    was using oxycodone 15 mg 3 times a day    Cocaine      Methamphetamines      LSD      Ecstasy      Benzodiazepines      Caffeine      Inhalants      Others:                          Medical Consequences of Substance Abuse: Confusion, erratic behavior  Legal Consequences of Substance Abuse: none  Family Consequences of Substance Abuse: May have contributed to break up of marriage  Blackouts:  No DT's:  No Withdrawal Symptoms:  Yes Diaphoresis Nausea Tremors Vomiting  Social History: Current Place of Residence: [place] of Birth: [place] Family Members: 3 sisters, 2 brothers Marital Status:  Divorced Children:   Sons: 1  Daughters:1 Relationships:  Education:  HS Soil scientist Problems/Performance:  Religious Beliefs/Practices:  Christian History of Abuse: none Pensions consultant; saw Training and development officer History:  Dispensing optician History: none Hobbies/Interests: Singing, skeet shooting  Family History:   Family History  Problem Relation Age of Onset  . Colon cancer Other     uncle  . Pancreatic cancer Sister   . Depression Sister   . Pancreatic cancer Brother   . Depression Brother   . Drug abuse Brother   . Diabetes Mother   . Heart disease Mother   . Kidney disease Mother   . Diabetes Other     9 siblings  . Alcohol abuse Brother     Mental Status Examination/Evaluation: Objective:  Appearance: Casual and Fairly Groomed  Engineer, water::  Fair  Speech:  Slow  Volume:  Decreased  Mood: Fairly good   Affect: Brighter   Thought Process:  Goal Directed  Orientation:  Full (Time, Place, and Person)  Thought Content:  Rumination  Suicidal Thoughts:  No  Homicidal Thoughts:  No  Judgement:  Fair  Insight:  Fair  Psychomotor Activity:  Normal  Akathisia:  No  Handed:  Right  AIMS (if indicated):    Assets:  Communication Skills Desire for Improvement Resilience Social Support Talents/Skills    Laboratory/X-Ray Psychological Evaluation(s)   Hospital labs are reviewed and indicate poorly controlled diabetes      Assessment:  Axis I: Major Depression, Recurrent severe and Substance Abuse  AXIS I Major Depression, Recurrent severe and Substance Abuse  AXIS II Deferred  AXIS III Past Medical History  Diagnosis Date  . Hypertension   . Diabetes mellitus   . GERD (gastroesophageal reflux disease)     not on Nexium for years  . Arthritis   . Chronic leg pain     since last back surgery  . Hyperlipidemia   . Gastroparesis      AXIS IV problems with primary support group  AXIS V 51-60 moderate symptoms   Treatment Plan/Recommendations:  Plan of Care: Medication management   Laboratory:  Psychotherapy: He is  seeing Dr. Jefm Miles here   Medications: He will continue Cymbalta 60 mg  daily.   Routine PRN Medications:  No  Consultations:   Safety Concerns:  He denies thoughts of self-harm today   Other: He will return in 2 months     Levonne Spiller, MD 2/5/20169:12 AM

## 2014-10-18 ENCOUNTER — Encounter: Payer: Self-pay | Admitting: Neurology

## 2014-10-18 ENCOUNTER — Ambulatory Visit (INDEPENDENT_AMBULATORY_CARE_PROVIDER_SITE_OTHER): Payer: BLUE CROSS/BLUE SHIELD | Admitting: Neurology

## 2014-10-18 ENCOUNTER — Ambulatory Visit (INDEPENDENT_AMBULATORY_CARE_PROVIDER_SITE_OTHER): Payer: Self-pay | Admitting: Neurology

## 2014-10-18 DIAGNOSIS — G5600 Carpal tunnel syndrome, unspecified upper limb: Secondary | ICD-10-CM

## 2014-10-18 DIAGNOSIS — G5601 Carpal tunnel syndrome, right upper limb: Secondary | ICD-10-CM

## 2014-10-18 DIAGNOSIS — G5603 Carpal tunnel syndrome, bilateral upper limbs: Secondary | ICD-10-CM

## 2014-10-18 DIAGNOSIS — G5602 Carpal tunnel syndrome, left upper limb: Secondary | ICD-10-CM

## 2014-10-18 DIAGNOSIS — M5010 Cervical disc disorder with radiculopathy, unspecified cervical region: Secondary | ICD-10-CM

## 2014-10-18 HISTORY — DX: Carpal tunnel syndrome, unspecified upper limb: G56.00

## 2014-10-18 NOTE — Progress Notes (Signed)
Please refer to EMG and nerve conduction study procedure note. 

## 2014-10-18 NOTE — Procedures (Signed)
HISTORY:  William Schroeder is a 54 year old gentleman with a history of poorly controlled diabetes who reports a one-year history of numbness in both hands unassociated with neck pain or pain down the arms. The patient is referred for evaluation of a neuropathy or cervical radiculopathy.  NERVE CONDUCTION STUDIES:  Nerve conduction studies were performed on both upper extremities. The distal motor latencies for the median nerves were prolonged bilaterally, with a low motor amplitude on the left, borderline normal motor amplitude on the right. The distal motor latencies and motor amplitudes for the ulnar nerves were normal bilaterally. The F wave latencies for the median nerves were prolonged bilaterally, normal for the right ulnar nerve, minimally prolonged for the left ulnar nerve. Nerve conduction velocities for the median and ulnar nerves were normal bilaterally. The sensory latencies for the median nerves were prolonged bilaterally, normal for the ulnar nerves bilaterally.  EMG STUDIES:  EMG study was performed on the right upper extremity:  The first dorsal interosseous muscle reveals 2 to 5 K units with decreased recruitment. 1+ fibrillations and positive waves were noted. The abductor pollicis brevis muscle reveals 2 to 6 K units with decreased recruitment. 1+ fibrillations and positive waves were noted. The extensor indicis proprius muscle reveals 1 to 3 K units with full recruitment. 1+ positive waves were noted. The pronator teres muscle reveals 2 to 3 K units with full recruitment. No fibrillations or positive waves were noted. The biceps muscle reveals 1 to 2 K units with full recruitment. No fibrillations or positive waves were noted. The triceps muscle reveals 2 to 4 K units with full recruitment. No fibrillations or positive waves were noted. The anterior deltoid muscle reveals 2 to 3 K units with full recruitment. No fibrillations or positive waves were noted. The cervical  paraspinal muscles were tested at 2 levels. 1+ positive waves were seen at both levels tested. There was good relaxation.  EMG study was performed on the left upper extremity:  The first dorsal interosseous muscle reveals 2 to 5 K units with decreased recruitment. 1+ fibrillations and positive waves were noted. The abductor pollicis brevis muscle reveals 2 to 4 K units with decreased recruitment. 1+ fibrillations and positive waves were noted. The extensor indicis proprius muscle reveals 1 to 3 K units with full recruitment. No fibrillations or positive waves were noted. The pronator teres muscle reveals 2 to 3 K units with full recruitment. No fibrillations or positive waves were noted. The biceps muscle reveals 1 to 2 K units with full recruitment. No fibrillations or positive waves were noted. The triceps muscle reveals 2 to 4 K units with full recruitment. No fibrillations or positive waves were noted. The anterior deltoid muscle reveals 2 to 3 K units with full recruitment. No fibrillations or positive waves were noted. The cervical paraspinal muscles were tested at 2 levels. No abnormalities of insertional activity were seen at the upper level tested. 1+ positive waves were seen in the lower level. There was good relaxation.   IMPRESSION:  Nerve conduction studies done on both upper extremities shows evidence of bilateral carpal tunnel syndrome of moderate severity. EMG evaluation of the right upper extremity shows findings consistent with carpal tunnel syndrome, but there appears to be evidence of an overlying mild C8 radiculopathy as well. EMG evaluation of the left upper extremity shows similar findings, with some possible mild involvement of the C6 or C7 levels as well.  Jill Alexanders MD 10/18/2014 1:58 PM  [facility] [address] [place], [place] [address]  Phone [phone] Fax [phone]

## 2014-11-06 ENCOUNTER — Encounter (HOSPITAL_COMMUNITY): Payer: Self-pay | Admitting: *Deleted

## 2014-11-06 ENCOUNTER — Ambulatory Visit (HOSPITAL_COMMUNITY): Payer: Self-pay | Admitting: Psychology

## 2014-12-12 ENCOUNTER — Ambulatory Visit (INDEPENDENT_AMBULATORY_CARE_PROVIDER_SITE_OTHER): Payer: BLUE CROSS/BLUE SHIELD | Admitting: Psychiatry

## 2014-12-12 ENCOUNTER — Encounter (HOSPITAL_COMMUNITY): Payer: Self-pay | Admitting: Psychiatry

## 2014-12-12 VITALS — BP 156/82 | HR 78 | Ht 70.0 in | Wt 202.8 lb

## 2014-12-12 DIAGNOSIS — F1919 Other psychoactive substance abuse with unspecified psychoactive substance-induced disorder: Secondary | ICD-10-CM

## 2014-12-12 DIAGNOSIS — F322 Major depressive disorder, single episode, severe without psychotic features: Secondary | ICD-10-CM | POA: Diagnosis not present

## 2014-12-12 MED ORDER — DULOXETINE HCL 60 MG PO CPEP: 60.0000 mg | ORAL_CAPSULE | Freq: Every day | ORAL | Status: DC

## 2014-12-12 NOTE — Progress Notes (Signed)
Patient ID: William Schroeder, male   DOB: October 22, 1960, 54 y.o.   MRN: [id number] Patient ID: William Schroeder, male   DOB: 1961/01/29, 54 y.o.   MRN: [id number]  Psychiatric Assessment Adult  Patient Identification:  William Schroeder Date of Evaluation:  12/12/2014 Chief Complaint: I've been depressed History of Chief Complaint:   Chief Complaint  Patient presents with  . Depression  . Anxiety  . Follow-up    Anxiety     this patient is a 54 year old divorced white male who lives alone in [place]. He has a grown son and daughter. He works as a Freight forwarder at [employer].  The patient was referred by behavioral health hospital after he was hospitalized in November for suicidal ideation with a plan to shoot himself with a gun.  The patient states that he had been depressed for about 2 years but had gotten worse since June 2015 when his divorce became final and he and his wife began living apart.. He got more and more despondent and found himself with a gun in his mouth one night. He asked his ex-wife for help and she brought him to the hospital. He was started on Cymbalta which seems to been helpful. Shortly after this hospitalization he elected [facility] [place]. He states that he has been using narcotics particular oxycodone 15 mg 3 times a day for back pain for about 2 years and it was really affecting his thinking and cognition.  The patient reports that his managers at work stated that he had to get treatment because he was making poor decisions and acting "loopy". He also thinks that the narcotic use affected his marriage. He has now been off narcotics entirely for several weeks. He is supposed to also join in and a group and has started counseling here with William Schroeder.  The patient states he is doing better now. He still hopes that he has wife would get back together but she has not indicated that this will necessarily happen. They have stayed close  friends. He still is depressed at times but not as much as before. He has some difficulty sleeping but has forgotten to take the trazodone most of the time and states that he will try to do better. He works 50-60 hours per week and enjoys his work. He used to enjoy skeet shooting but his family removed all his guns after the suicide attempt. He states that his memory is "shot" and he is not sure why but it may be due to the narcotic use. He used to enjoy listening to songs and singing them but now he can't remember the words. He denies anxiety or panic and denies any current auditory or visual hallucinations paranoia or further suicidal ideation. His diet is extremely poor particular given that he is a diabetic. He's lost 100 pounds in the last 3 years and is very scared to regain it. However he often will only eat at diet soda and a breakfast pastry for the entire day. His blood sugar and A1c are very much out of control. He is followed byDr. Quillian Schroeder for this  The patient returns after 2 months. Not much is changed. He still somewhat depressed but better than he used to be. He doesn't have interest in his old hobbies likes skeet shooting or fishing. This may be because he was always gone doing these things on the weekends and this upset his wife and may have  led to the separation. They're still talking as friends. He often lays awake at night and thinks about his marriage. He denies any suicidal ideation and he is not using drugs or alcohol. He's not sleeping all that well and is willing to try half of his trazodone so he can sleep better. Review of Systems  Constitutional: Positive for appetite change.  HENT: Negative.   Eyes: Negative.   Respiratory: Negative.   Cardiovascular: Negative.   Gastrointestinal: Positive for abdominal pain.  Endocrine: Negative.   Genitourinary: Negative.   Musculoskeletal: Positive for back pain.  Skin: Negative.   Allergic/Immunologic: Negative.   Neurological:  Negative.   Hematological: Negative.   Psychiatric/Behavioral: Positive for sleep disturbance and dysphoric mood.   Physical Exam not done  Depressive Symptoms: depressed mood, anhedonia, insomnia, psychomotor retardation, impaired memory, weight loss,  (Hypo) Manic Symptoms:   Elevated Mood:  No Irritable Mood:  No Grandiosity:  No Distractibility:  No Labiality of Mood:  No Delusions:  No Hallucinations:  No Impulsivity:  No Sexually Inappropriate Behavior:  No Financial Extravagance:  No Flight of Ideas:  No  Anxiety Symptoms: Excessive Worry:  Yes Panic Symptoms:  No Agoraphobia:  No Obsessive Compulsive: No  Symptoms: None, Specific Phobias:  No Social Anxiety:  No  Psychotic Symptoms:  Hallucinations: No None Delusions:  No Paranoia:  No   Ideas of Reference:  No  PTSD Symptoms: Ever had a traumatic exposure:  No Had a traumatic exposure in the last month:  No Re-experiencing: No None Hypervigilance:  No Hyperarousal: No None Avoidance: No None  Traumatic Brain Injury: No   Past Psychiatric History: Diagnosis: Major depression   Hospitalizations: [facility] 11/15  Outpatient Care: none  Substance Abuse Care: [facility] in [place] last month   Self-Mutilation: none  Suicidal Attempts: Prior to hospitalization in November   Violent Behaviors: none   Past Medical History:   Past Medical History  Diagnosis Date  . Hypertension   . Diabetes mellitus   . GERD (gastroesophageal reflux disease)     not on Nexium for years  . Arthritis   . Chronic leg pain     since last back surgery  . Hyperlipidemia   . Gastroparesis   . Carpal tunnel syndrome 10/18/2014    Bilateral   History of Loss of Consciousness:  No Seizure History:  No Cardiac History:  No Allergies:  No Known Allergies Current Medications:  Current Outpatient Prescriptions  Medication Sig Dispense Refill  . DULoxetine (CYMBALTA) 60 MG capsule Take 1 capsule (60  mg total) by mouth daily. 30 capsule 2  . gabapentin (NEURONTIN) 300 MG capsule Take 300 mg by mouth. Taking 2 Tablets in AM, 2 Tablets in PM and 2 Tablets at Bedtime    . insulin glargine (LANTUS) 100 UNIT/ML injection Inject 0.62 mLs (62 Units total) into the skin at bedtime. (Patient taking differently: Inject 50 Units into the skin at bedtime. ) 10 mL 11  . lisinopril (PRINIVIL,ZESTRIL) 20 MG tablet Take 1 tablet (20 mg total) by mouth daily.    Marland Kitchen omeprazole (PRILOSEC) 20 MG capsule Take 20 mg by mouth daily.    . ondansetron (ZOFRAN) 4 MG tablet Take 4 mg by mouth daily.    . traZODone (DESYREL) 100 MG tablet Take 1 tablet (100 mg total) by mouth at bedtime. (Patient not taking: Reported on 10/13/2014) 30 tablet 0   No current facility-administered medications for this visit.    Previous Psychotropic Medications:  Medication Dose                          Substance Abuse History in the last 12 months: Substance Age of 1st Use Last Use Amount Specific Type  Nicotine      Alcohol      Cannabis      Opiates    was using oxycodone 15 mg 3 times a day    Cocaine      Methamphetamines      LSD      Ecstasy      Benzodiazepines      Caffeine      Inhalants      Others:                          Medical Consequences of Substance Abuse: Confusion, erratic behavior  Legal Consequences of Substance Abuse: none  Family Consequences of Substance Abuse: May have contributed to break up of marriage  Blackouts:  No DT's:  No Withdrawal Symptoms:  Yes Diaphoresis Nausea Tremors Vomiting  Social History: Current Place of Residence: [place] of Birth: [place] Family Members: 3 sisters, 2 brothers Marital Status:  Divorced Children:   Sons: 1  Daughters:1 Relationships:  Education:  HS Soil scientist Problems/Performance:  Religious Beliefs/Practices: Christian History of Abuse: none Pensions consultant; saw Training and development officer  History:  Dispensing optician History: none Hobbies/Interests: Singing, skeet shooting  Family History:   Family History  Problem Relation Age of Onset  . Colon cancer Other     uncle  . Pancreatic cancer Sister   . Depression Sister   . Pancreatic cancer Brother   . Depression Brother   . Drug abuse Brother   . Diabetes Mother   . Heart disease Mother   . Kidney disease Mother   . Diabetes Other     9 siblings  . Alcohol abuse Brother     Mental Status Examination/Evaluation: Objective:  Appearance: Casual and Fairly Groomed  Engineer, water::  Fair  Speech:  Slow  Volume:  Decreased  Mood: Fairly good   Affect: Bright   Thought Process:  Goal Directed  Orientation:  Full (Time, Place, and Person)  Thought Content:  Rumination  Suicidal Thoughts:  No  Homicidal Thoughts:  No  Judgement:  Fair  Insight:  Fair  Psychomotor Activity:  Normal  Akathisia:  No  Handed:  Right  AIMS (if indicated):    Assets:  Communication Skills Desire for Improvement Resilience Social Support Talents/Skills    Laboratory/X-Ray Psychological Evaluation(s)   Hospital labs are reviewed and indicate poorly controlled diabetes      Assessment:  Axis I: Major Depression, Recurrent severe and Substance Abuse  AXIS I Major Depression, Recurrent severe and Substance Abuse  AXIS II Deferred  AXIS III Past Medical History  Diagnosis Date  . Hypertension   . Diabetes mellitus   . GERD (gastroesophageal reflux disease)     not on Nexium for years  . Arthritis   . Chronic leg pain     since last back surgery  . Hyperlipidemia   . Gastroparesis   . Carpal tunnel syndrome 10/18/2014    Bilateral     AXIS IV problems with primary support group  AXIS V 51-60 moderate symptoms   Treatment Plan/Recommendations:  Plan of Care: Medication management   Laboratory:  Psychotherapy: He is seeing Dr. Jefm Miles here   Medications:  He will continue Cymbalta 60 mg daily. He will try trazodone at 50 mg  daily at bedtime   Routine PRN Medications:  No  Consultations:   Safety Concerns:  He denies thoughts of self-harm today   Other: He will return in 2 months     Levonne Spiller, MD 4/5/201610:15 AM

## 2015-01-04 ENCOUNTER — Ambulatory Visit (INDEPENDENT_AMBULATORY_CARE_PROVIDER_SITE_OTHER): Payer: BLUE CROSS/BLUE SHIELD | Admitting: Psychology

## 2015-01-04 DIAGNOSIS — F322 Major depressive disorder, single episode, severe without psychotic features: Secondary | ICD-10-CM | POA: Diagnosis not present

## 2015-02-02 ENCOUNTER — Ambulatory Visit (HOSPITAL_COMMUNITY): Payer: Self-pay | Admitting: Psychology

## 2015-02-13 ENCOUNTER — Ambulatory Visit (HOSPITAL_COMMUNITY): Payer: Self-pay | Admitting: Psychiatry

## 2015-02-19 ENCOUNTER — Other Ambulatory Visit (HOSPITAL_COMMUNITY): Payer: Self-pay | Admitting: Psychiatry

## 2015-03-09 ENCOUNTER — Ambulatory Visit (HOSPITAL_COMMUNITY): Payer: Self-pay | Admitting: Psychology

## 2015-03-27 ENCOUNTER — Other Ambulatory Visit (HOSPITAL_COMMUNITY): Payer: Self-pay | Admitting: Psychiatry

## 2015-03-27 MED ORDER — DULOXETINE HCL 60 MG PO CPEP: 60.0000 mg | ORAL_CAPSULE | Freq: Every day | ORAL | Status: DC

## 2015-03-29 ENCOUNTER — Other Ambulatory Visit (HOSPITAL_COMMUNITY): Payer: Self-pay | Admitting: Psychiatry

## 2015-03-30 NOTE — Progress Notes (Signed)
      Patient:  William Schroeder   DOB: [birth year]  MR Number: [id number]  Location: [facility] [address] [place] [address] Dept: [phone]  Start: 9 AM End: 10 AM  Provider/Observer:     Edgardo Roys PSYD  Chief Complaint:      Chief Complaint  Patient presents with  . Anxiety  . Depression  . Stress    Reason For Service:     The patient was referred by the inpatient unit for psychological care and psychiatric follow-up. The patient is a 54 year old Caucasian male who was hospitalized after a suicide attempt on November 28. The patient reports that he put a 45 caliber handgun into his mouth reports that she tried to pull the trigger but it did not fire. Prior to doing this he had told his wife that she should move on. The patient reports that he and his wife/ex-wife have been having a lot of problems recently. He reports that both of them had been dating other people. They've been married for 35 years and have 2 kids. The patient's wife was also present for the first session and she reports that they have been married for 35 years but the marriage was not good. 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 The patient reports that his wife had numerous personal losses in 2013 and that it had a significant impact on his wife and he developed severe depression. The patient reports that as of now he and his wife have both stopped dating the other people they've been dating her trying to work together if they can. The patient reports that he is not currently suicidal but has had suicidal ideation in the past.  Interventions Strategy:  Cognitive/behavioral psychotherapeutic interventions  Participation Level:   Active  Participation Quality:  Appropriate      Behavioral Observation:  Well Groomed, Alert, and Appropriate.   Current Psychosocial Factors: The  patient reports that the relationship with wife has gotten better and he is doing things to help her out and try to make her life better.    Content of Session:   Reviewed current symptoms and work on therapeutic interventions to begin trying to deal with the major depressive episode.  Current Status:   The patient reports that he is doing much better with his mood and that the relationship has improved a great deal.  Patient Progress:   Stable  Target Goals:   Target goals include reducing the intensity, severity, and frequency of depressive symptoms.  Last Reviewed:   01/03/2015  Goals Addressed Today:    Today we worked on trying to build coping skills both behaviorally and cognitively around issues of recurrent depression in building better coping skills around relationship issues with his wife.  Impression/Diagnosis:   The patient has a history of prior depression but more recently developed severe depression with suicidal ideation and suicidal gesture. The patient suicidal gesture was serious. He was hospitalized. Currently he denies any active suicidal ideation.  The patient contracts for safety and has been well informed of what to do if he develops suicidal ideation and a plan in the future.  Diagnosis:    Axis I: Major depressive disorder, single episode, severe without psychotic features     Kweku Stankey R, PsyD 03/30/2015

## 2015-04-16 ENCOUNTER — Ambulatory Visit (HOSPITAL_COMMUNITY): Payer: Self-pay | Admitting: Psychology

## 2015-06-19 ENCOUNTER — Ambulatory Visit: Payer: Self-pay | Admitting: Urology

## 2015-07-04 ENCOUNTER — Other Ambulatory Visit (HOSPITAL_COMMUNITY): Payer: Self-pay | Admitting: Psychiatry

## 2015-09-07 ENCOUNTER — Other Ambulatory Visit: Payer: Self-pay | Admitting: "Endocrinology

## 2016-01-21 NOTE — Patient Instructions (Addendum)
Your procedure is scheduled on:  01/28/2016  Report to [facility] at  45  AM.  Call this number if you have problems the morning of surgery: [phone]   Do not eat food or drink liquids :After Midnight.      Take these medicines the morning of surgery with A SIP OF WATER: cymbalta, neurontin, lisinopril   DO NOT TAKE DIABETIC MEDICATIONS DAY OF PROCEDURE  TAKE ONLY 1/2 DOSE OF INSULIN EVENING PRIOR TO PROCEDURE   Do not wear jewelry, make-up or nail polish.  Do not wear lotions, powders, or perfumes. You may wear deodorant.  Do not shave 48 hours prior to surgery.  Do not bring valuables to the hospital.  Contacts, dentures or bridgework may not be worn into surgery.  Leave suitcase in the car. After surgery it may be brought to your room.  For patients admitted to the hospital, checkout time is 11:00 AM the day of discharge.   Patients discharged the day of surgery will not be allowed to drive home.  :     Please read over the following fact sheets that you were given: Coughing and Deep Breathing, Surgical Site Infection Prevention, Anesthesia Post-op Instructions and Care and Recovery After Surgery    Cataract A cataract is a clouding of the lens of the eye. When a lens becomes cloudy, vision is reduced based on the degree and nature of the clouding. Many cataracts reduce vision to some degree. Some cataracts make people more near-sighted as they develop. Other cataracts increase glare. Cataracts that are ignored and become worse can sometimes look white. The white color can be seen through the pupil. CAUSES   Aging. However, cataracts may occur at any age, even in newborns.   Certain drugs.   Trauma to the eye.   Certain diseases such as diabetes.   Specific eye diseases such as chronic inflammation inside the eye or a sudden attack of a rare form of glaucoma.   Inherited or acquired medical problems.  SYMPTOMS   Gradual, progressive drop in vision in the affected eye.     Severe, rapid visual loss. This most often happens when trauma is the cause.  DIAGNOSIS  To detect a cataract, an eye doctor examines the lens. Cataracts are best diagnosed with an exam of the eyes with the pupils enlarged (dilated) by drops.  TREATMENT  For an early cataract, vision may improve by using different eyeglasses or stronger lighting. If that does not help your vision, surgery is the only effective treatment. A cataract needs to be surgically removed when vision loss interferes with your everyday activities, such as driving, reading, or watching TV. A cataract may also have to be removed if it prevents examination or treatment of another eye problem. Surgery removes the cloudy lens and usually replaces it with a substitute lens (intraocular lens, IOL).  At a time when both you and your doctor agree, the cataract will be surgically removed. If you have cataracts in both eyes, only one is usually removed at a time. This allows the operated eye to heal and be out of danger from any possible problems after surgery (such as infection or poor wound healing). In rare cases, a cataract may be doing damage to your eye. In these cases, your caregiver may advise surgical removal right away. The vast majority of people who have cataract surgery have better vision afterward. HOME CARE INSTRUCTIONS  If you are not planning surgery, you may be asked to do  the following:  Use different eyeglasses.   Use stronger or brighter lighting.   Ask your eye doctor about reducing your medicine dose or changing medicines if it is thought that a medicine caused your cataract. Changing medicines does not make the cataract go away on its own.   Become familiar with your surroundings. Poor vision can lead to injury. Avoid bumping into things on the affected side. You are at a higher risk for tripping or falling.   Exercise extreme care when driving or operating machinery.   Wear sunglasses if you are sensitive  to bright light or experiencing problems with glare.  SEEK IMMEDIATE MEDICAL CARE IF:   You have a worsening or sudden vision loss.   You notice redness, swelling, or increasing pain in the eye.   You have a fever.  Document Released: 08/25/2005 Document Revised: 08/14/2011 Document Reviewed: 04/18/2011 [facility] Patient Information 2012 [place].PATIENT INSTRUCTIONS POST-ANESTHESIA  IMMEDIATELY FOLLOWING SURGERY:  Do not drive or operate machinery for the first twenty four hours after surgery.  Do not make any important decisions for twenty four hours after surgery or while taking narcotic pain medications or sedatives.  If you develop intractable nausea and vomiting or a severe headache please notify your doctor immediately.  FOLLOW-UP:  Please make an appointment with your surgeon as instructed. You do not need to follow up with anesthesia unless specifically instructed to do so.  WOUND CARE INSTRUCTIONS (if applicable):  Keep a dry clean dressing on the anesthesia/puncture wound site if there is drainage.  Once the wound has quit draining you may leave it open to air.  Generally you should leave the bandage intact for twenty four hours unless there is drainage.  If the epidural site drains for more than 36-48 hours please call the anesthesia department.  QUESTIONS?:  Please feel free to call your physician or the hospital operator if you have any questions, and they will be happy to assist you.

## 2016-01-22 ENCOUNTER — Other Ambulatory Visit: Payer: Self-pay

## 2016-01-22 ENCOUNTER — Encounter (HOSPITAL_COMMUNITY): Payer: Self-pay

## 2016-01-22 ENCOUNTER — Encounter (HOSPITAL_COMMUNITY)
Admission: RE | Admit: 2016-01-22 | Discharge: 2016-01-22 | Disposition: A | Discharge status: Home / Self Care | Payer: BLUE CROSS/BLUE SHIELD | Source: Ambulatory Visit | Attending: Ophthalmology | Admitting: Ophthalmology

## 2016-01-22 DIAGNOSIS — Z01812 Encounter for preprocedural laboratory examination: Secondary | ICD-10-CM | POA: Insufficient documentation

## 2016-01-22 DIAGNOSIS — Z01810 Encounter for preprocedural cardiovascular examination: Secondary | ICD-10-CM | POA: Diagnosis not present

## 2016-01-22 LAB — BASIC METABOLIC PANEL
ANION GAP: 7 (ref 5–15)
BUN: 15 mg/dL (ref 6–20)
CO2: 29 mmol/L (ref 22–32)
Calcium: 9.6 mg/dL (ref 8.9–10.3)
Chloride: 100 mmol/L — ABNORMAL LOW (ref 101–111)
Creatinine, Ser: 0.8 mg/dL (ref 0.61–1.24)
GLUCOSE: 259 mg/dL — AB (ref 65–99)
POTASSIUM: 4 mmol/L (ref 3.5–5.1)
Sodium: 136 mmol/L (ref 135–145)

## 2016-01-22 LAB — CBC WITH DIFFERENTIAL/PLATELET
BASOS ABS: 0 10*3/uL (ref 0.0–0.1)
Basophils Relative: 0 %
Eosinophils Absolute: 0.2 10*3/uL (ref 0.0–0.7)
Eosinophils Relative: 2 %
HEMATOCRIT: 46.3 % (ref 39.0–52.0)
Hemoglobin: 16.1 g/dL (ref 13.0–17.0)
LYMPHS PCT: 29 %
Lymphs Abs: 2.9 10*3/uL (ref 0.7–4.0)
MCH: 30.1 pg (ref 26.0–34.0)
MCHC: 34.8 g/dL (ref 30.0–36.0)
MCV: 86.5 fL (ref 78.0–100.0)
MONO ABS: 1.1 10*3/uL — AB (ref 0.1–1.0)
Monocytes Relative: 11 %
NEUTROS ABS: 5.9 10*3/uL (ref 1.7–7.7)
Neutrophils Relative %: 58 %
Platelets: 271 10*3/uL (ref 150–400)
RBC: 5.35 MIL/uL (ref 4.22–5.81)
RDW: 12.8 % (ref 11.5–15.5)
WBC: 10.2 10*3/uL (ref 4.0–10.5)

## 2016-01-28 ENCOUNTER — Ambulatory Visit (HOSPITAL_COMMUNITY): Payer: BLUE CROSS/BLUE SHIELD | Admitting: Anesthesiology

## 2016-01-28 ENCOUNTER — Ambulatory Visit (HOSPITAL_COMMUNITY)
Admission: RE | Admit: 2016-01-28 | Discharge: 2016-01-28 | Disposition: A | Discharge status: Home / Self Care | Payer: BLUE CROSS/BLUE SHIELD | Source: Ambulatory Visit | Attending: Ophthalmology | Admitting: Ophthalmology

## 2016-01-28 ENCOUNTER — Encounter (HOSPITAL_COMMUNITY): Payer: Self-pay | Admitting: *Deleted

## 2016-01-28 ENCOUNTER — Encounter (HOSPITAL_COMMUNITY)
Admission: RE | Disposition: A | Discharge status: Home / Self Care | Payer: Self-pay | Source: Ambulatory Visit | Attending: Ophthalmology

## 2016-01-28 DIAGNOSIS — I10 Essential (primary) hypertension: Secondary | ICD-10-CM | POA: Diagnosis not present

## 2016-01-28 DIAGNOSIS — E119 Type 2 diabetes mellitus without complications: Secondary | ICD-10-CM | POA: Diagnosis not present

## 2016-01-28 DIAGNOSIS — Z79899 Other long term (current) drug therapy: Secondary | ICD-10-CM | POA: Insufficient documentation

## 2016-01-28 DIAGNOSIS — Z794 Long term (current) use of insulin: Secondary | ICD-10-CM | POA: Insufficient documentation

## 2016-01-28 DIAGNOSIS — E7800 Pure hypercholesterolemia, unspecified: Secondary | ICD-10-CM | POA: Insufficient documentation

## 2016-01-28 DIAGNOSIS — Z7982 Long term (current) use of aspirin: Secondary | ICD-10-CM | POA: Diagnosis not present

## 2016-01-28 DIAGNOSIS — F329 Major depressive disorder, single episode, unspecified: Secondary | ICD-10-CM | POA: Insufficient documentation

## 2016-01-28 DIAGNOSIS — H25811 Combined forms of age-related cataract, right eye: Secondary | ICD-10-CM | POA: Diagnosis present

## 2016-01-28 HISTORY — PX: CATARACT EXTRACTION W/PHACO: SHX586

## 2016-01-28 LAB — GLUCOSE, CAPILLARY: Glucose-Capillary: 249 mg/dL — ABNORMAL HIGH (ref 65–99)

## 2016-01-28 SURGERY — PHACOEMULSIFICATION, CATARACT, WITH IOL INSERTION
Anesthesia: Monitor Anesthesia Care | Site: Eye | Laterality: Right

## 2016-01-28 MED ORDER — MIDAZOLAM HCL 2 MG/2ML IJ SOLN
INTRAMUSCULAR | Status: AC
  Filled 2016-01-28: qty 2

## 2016-01-28 MED ORDER — PHENYLEPHRINE HCL 2.5 % OP SOLN
1.0000 [drp] | OPHTHALMIC | Status: AC
  Administered 2016-01-28 (×3): 1 [drp] via OPHTHALMIC

## 2016-01-28 MED ORDER — LIDOCAINE 3.5 % OP GEL OPTIME - NO CHARGE
OPHTHALMIC | Status: DC | PRN
  Administered 2016-01-28: 1 [drp] via OPHTHALMIC

## 2016-01-28 MED ORDER — PROVISC 10 MG/ML IO SOLN
INTRAOCULAR | Status: DC | PRN
  Administered 2016-01-28: 0.85 mL via INTRAOCULAR

## 2016-01-28 MED ORDER — CYCLOPENTOLATE-PHENYLEPHRINE 0.2-1 % OP SOLN
1.0000 [drp] | OPHTHALMIC | Status: AC
  Administered 2016-01-28 (×3): 1 [drp] via OPHTHALMIC

## 2016-01-28 MED ORDER — LIDOCAINE HCL 3.5 % OP GEL
1.0000 | Freq: Once | OPHTHALMIC | Status: AC
Start: 2016-01-28 — End: 2016-01-28
  Administered 2016-01-28: 1 via OPHTHALMIC

## 2016-01-28 MED ORDER — LIDOCAINE HCL (PF) 1 % IJ SOLN
INTRAOCULAR | Status: DC | PRN
  Administered 2016-01-28: .8 mL via OPHTHALMIC

## 2016-01-28 MED ORDER — LACTATED RINGERS IV SOLN
INTRAVENOUS | Status: DC
  Administered 2016-01-28: 08:00:00 via INTRAVENOUS

## 2016-01-28 MED ORDER — EPINEPHRINE HCL 1 MG/ML IJ SOLN
INTRAOCULAR | Status: DC | PRN
  Administered 2016-01-28: 500 mL

## 2016-01-28 MED ORDER — EPINEPHRINE HCL 1 MG/ML IJ SOLN
INTRAMUSCULAR | Status: AC
  Filled 2016-01-28: qty 1

## 2016-01-28 MED ORDER — FENTANYL CITRATE (PF) 100 MCG/2ML IJ SOLN
25.0000 ug | INTRAMUSCULAR | Status: AC
  Administered 2016-01-28 (×2): 25 ug via INTRAVENOUS

## 2016-01-28 MED ORDER — BSS IO SOLN
INTRAOCULAR | Status: DC | PRN
  Administered 2016-01-28: 15 mL

## 2016-01-28 MED ORDER — FENTANYL CITRATE (PF) 100 MCG/2ML IJ SOLN
INTRAMUSCULAR | Status: AC
  Filled 2016-01-28: qty 2

## 2016-01-28 MED ORDER — TETRACAINE HCL 0.5 % OP SOLN
1.0000 [drp] | OPHTHALMIC | Status: AC
  Administered 2016-01-28 (×3): 1 [drp] via OPHTHALMIC

## 2016-01-28 MED ORDER — NEOMYCIN-POLYMYXIN-DEXAMETH 3.5-10000-0.1 OP SUSP
OPHTHALMIC | Status: DC | PRN
  Administered 2016-01-28: 2 [drp] via OPHTHALMIC

## 2016-01-28 MED ORDER — POVIDONE-IODINE 5 % OP SOLN
OPHTHALMIC | Status: DC | PRN
  Administered 2016-01-28: 1 via OPHTHALMIC

## 2016-01-28 MED ORDER — MIDAZOLAM HCL 2 MG/2ML IJ SOLN
1.0000 mg | INTRAMUSCULAR | Status: DC | PRN
  Administered 2016-01-28: 2 mg via INTRAVENOUS

## 2016-01-28 SURGICAL SUPPLY — 12 items
CLOTH BEACON ORANGE TIMEOUT ST (SAFETY) ×2 IMPLANT
EYE SHIELD UNIVERSAL CLEAR (GAUZE/BANDAGES/DRESSINGS) ×2 IMPLANT
GLOVE BIOGEL PI IND STRL 7.0 (GLOVE) IMPLANT
GLOVE BIOGEL PI INDICATOR 7.0 (GLOVE) ×2
GLOVE SKINSENSE NS SZ6.5 (GLOVE) ×2
GLOVE SKINSENSE STRL SZ6.5 (GLOVE) IMPLANT
PAD ARMBOARD 7.5X6 YLW CONV (MISCELLANEOUS) ×2 IMPLANT
SIGHTPATH CAT PROC W REG LENS (Ophthalmic Related) ×3 IMPLANT
SYRINGE LUER LOK 1CC (MISCELLANEOUS) ×2 IMPLANT
TAPE SURG TRANSPARENT 2IN (GAUZE/BANDAGES/DRESSINGS) IMPLANT
TAPE TRANSPARENT 2IN (GAUZE/BANDAGES/DRESSINGS) ×2
WATER STERILE IRR 250ML POUR (IV SOLUTION) ×2 IMPLANT

## 2016-01-28 NOTE — Discharge Instructions (Signed)

## 2016-01-28 NOTE — Anesthesia Preprocedure Evaluation (Signed)
Anesthesia Evaluation  Patient identified by MRN, date of birth, ID band Patient awake    Reviewed: Allergy & Precautions, NPO status , Patient's Chart, lab work & pertinent test results  Airway Mallampati: II  TM Distance: >3 FB     Dental  (+) Partial Upper   Pulmonary former smoker,    breath sounds clear to auscultation       Cardiovascular hypertension, Pt. on medications  Rhythm:Regular Rate:Normal     Neuro/Psych PSYCHIATRIC DISORDERS Depression  Neuromuscular disease    GI/Hepatic GERD  Medicated,  Endo/Other  diabetes, Type 2, Insulin Dependent  Renal/GU      Musculoskeletal   Abdominal   Peds  Hematology   Anesthesia Other Findings   Reproductive/Obstetrics                             Anesthesia Physical Anesthesia Plan  ASA: III  Anesthesia Plan: MAC   Post-op Pain Management:    Induction: Intravenous  Airway Management Planned: Nasal Cannula  Additional Equipment:   Intra-op Plan:   Post-operative Plan:   Informed Consent: I have reviewed the patients History and Physical, chart, labs and discussed the procedure including the risks, benefits and alternatives for the proposed anesthesia with the patient or authorized representative who has indicated his/her understanding and acceptance.     Plan Discussed with:   Anesthesia Plan Comments:         Anesthesia Quick Evaluation

## 2016-01-28 NOTE — H&P (Signed)
I have reviewed the H&P, the patient was re-examined, and I have identified no interval changes in medical condition and plan of care since the history and physical of record  

## 2016-01-28 NOTE — Anesthesia Postprocedure Evaluation (Signed)
Anesthesia Post Note  Patient: William Schroeder  Procedure(s) Performed: Procedure(s) (LRB): CATARACT EXTRACTION PHACO AND INTRAOCULAR LENS PLACEMENT (IOC) (Right)  Patient location during evaluation: Short Stay Anesthesia Type: MAC Level of consciousness: awake and alert Pain management: pain level controlled Vital Signs Assessment: post-procedure vital signs reviewed and stable Respiratory status: spontaneous breathing Cardiovascular status: blood pressure returned to baseline Postop Assessment: no signs of nausea or vomiting Anesthetic complications: no    Last Vitals:  Filed Vitals:   01/28/16 0726 01/28/16 0750  BP: 173/95 149/85  Pulse: 79   Temp: 36.8 C   Resp: 17     Last Pain: There were no vitals filed for this visit.               Chassity Ludke

## 2016-01-28 NOTE — Op Note (Signed)
Date of Admission: 01/28/2016  Date of Surgery: 01/28/2016   Pre-Op Dx: Cataract Right Eye  Post-Op Dx: Senile combined Cataract Right  Eye,  Dx Code [id number]  Surgeon: Tonny Branch, M.D.  Assistants: None  Anesthesia: Topical with MAC  Indications: Painless, progressive loss of vision with compromise of daily activities.  Surgery: Cataract Extraction with Intraocular lens Implant Right Eye  Discription: The patient had dilating drops and viscous lidocaine placed into the Right eye in the pre-op holding area. After transfer to the operating room, a time out was performed. The patient was then prepped and draped. Beginning with a 58 degree blade a paracentesis port was made at the surgeon's 2 o'clock position. The anterior chamber was then filled with 1% non-preserved lidocaine. This was followed by filling the anterior chamber with Provisc.  A 2.50mm keratome blade was used to make a clear corneal incision at the temporal limbus.  A bent cystatome needle was used to create a continuous tear capsulotomy. Hydrodissection was performed with balanced salt solution on a Fine canula. The lens nucleus was then removed using the phacoemulsification handpiece. Residual cortex was removed with the I&A handpiece. The anterior chamber and capsular bag were refilled with Provisc. A posterior chamber intraocular lens was placed into the capsular bag with it's injector. The implant was positioned with the Kuglan hook. The Provisc was then removed from the anterior chamber and capsular bag with the I&A handpiece. Stromal hydration of the main incision and paracentesis port was performed with BSS on a Fine canula. The wounds were tested for leak which was negative. The patient tolerated the procedure well. There were no operative complications. The patient was then transferred to the recovery room in stable condition.  Complications: None  Specimen: None  EBL: None  Prosthetic device: Hoya iSert 250, power 20.5  D, SN [id number].

## 2016-01-28 NOTE — Transfer of Care (Signed)
Immediate Anesthesia Transfer of Care Note  Patient: William Schroeder  Procedure(s) Performed: Procedure(s) with comments: CATARACT EXTRACTION PHACO AND INTRAOCULAR LENS PLACEMENT (IOC) (Right) - CDE: 6.18  Patient Location: PACU  Anesthesia Type:MAC  Level of Consciousness: awake, alert  and oriented  Airway & Oxygen Therapy: Patient Spontanous Breathing  Post-op Assessment: Report given to RN  Post vital signs: Reviewed and stable  Last Vitals:  Filed Vitals:   01/28/16 0726 01/28/16 0750  BP: 173/95 149/85  Pulse: 79   Temp: 36.8 C   Resp: 17     Last Pain: There were no vitals filed for this visit.    Patients Stated Pain Goal: 7 ([id number])  Complications: No apparent anesthesia complications

## 2016-01-29 ENCOUNTER — Encounter (HOSPITAL_COMMUNITY): Payer: Self-pay | Admitting: Ophthalmology

## 2017-12-09 ENCOUNTER — Encounter: Payer: Self-pay | Admitting: Internal Medicine

## 2017-12-16 HISTORY — PX: LUMBAR FUSION: SHX111

## 2018-12-06 ENCOUNTER — Ambulatory Visit: Payer: Self-pay | Admitting: Ophthalmology

## 2018-12-06 ENCOUNTER — Other Ambulatory Visit: Payer: Self-pay

## 2018-12-06 ENCOUNTER — Encounter (HOSPITAL_COMMUNITY): Payer: Self-pay | Admitting: *Deleted

## 2018-12-06 NOTE — H&P (Signed)
Date of examination:  12/07/18  Indication for surgery: Tractional retinal detachment left eye  Pertinent past medical history:  Past Medical History:  Diagnosis Date  . Arthritis   . Carpal tunnel syndrome 10/18/2014   Bilateral  . Chronic leg pain    since last back surgery  . DDD (degenerative disc disease), lumbar   . Depression   . Diabetes mellitus    Type II  . Gastroparesis   . GERD (gastroesophageal reflux disease)   . Headache   . Hyperlipidemia   . Hypertension   . Neuropathy   . Stroke [facility]) March , July 2017   right arm    Pertinent ocular history:  Severe PDR with TRD OS  Pertinent family history:  Family History  Problem Relation Age of Onset  . Colon cancer Other        uncle  . Pancreatic cancer Sister   . Depression Sister   . Pancreatic cancer Brother   . Depression Brother   . Drug abuse Brother   . Diabetes Mother   . Heart disease Mother   . Kidney disease Mother   . Diabetes Other        9 siblings  . Alcohol abuse Brother     General:  Healthy appearing patient in no distress.    Eyes:    Acuity   OS CF  External: Within normal limits    Anterior segment: Within normal limits     Fundus: Preretinal heme with associated TRD OS   Impression:  Severe PDR with TRD OS  Plan: Tractional retinal detachment repair left eye  Royston Cowper

## 2018-12-06 NOTE — Progress Notes (Addendum)
William Schroeder denies chest pain or shortness of breath.  Patient is a type II diabetic. CBG's run 80- 150. Patient is on a sliding scale 70/30- per ED MD, after patient had a fall when he was hypoglycemic. I instructed patient to not take any 70- 30 or Metformin in am. I instructed patient to check CBG after awaking and every 2 hours until arrival  to the hospital.  I Instructed patient if CBG is less than 70 to take 4 Glucose Tablets drink 1/2 cup of a clear juice. Recheck CBG in 15 minutes then call pre- op desk at [phone] for further instructions.   Coronavirus Screening  Have you experienced the following symptoms:  Cough no Fever (>100.69F) no Runny nose no Sore throat p Difficulty breathing/shortness of breath  no  Have you or a family member traveled in the last 14 days and where? no  If the patient is not experiencing any of these symptoms, the PAT nurse will instruct them to NOT bring anyone with them to their appointment since they may have these symptoms or traveled as well.   Please remind your patients and families that hospital visitation restrictions are in effect and the importance of the restrictions.   PCP is at [facility].

## 2018-12-06 NOTE — H&P (Deleted)
  The note originally documented on this encounter has been moved the the encounter in which it belongs.  

## 2018-12-07 ENCOUNTER — Ambulatory Visit (HOSPITAL_COMMUNITY): Payer: Medicare Other | Admitting: Certified Registered Nurse Anesthetist

## 2018-12-07 ENCOUNTER — Encounter (HOSPITAL_COMMUNITY)
Admission: RE | Disposition: A | Discharge status: Home / Self Care | Payer: Self-pay | Source: Home / Self Care | Attending: Ophthalmology

## 2018-12-07 ENCOUNTER — Other Ambulatory Visit: Payer: Self-pay

## 2018-12-07 ENCOUNTER — Ambulatory Visit (HOSPITAL_COMMUNITY)
Admission: RE | Admit: 2018-12-07 | Discharge: 2018-12-07 | Disposition: A | Discharge status: Home / Self Care | Payer: Medicare Other | Attending: Ophthalmology | Admitting: Ophthalmology

## 2018-12-07 ENCOUNTER — Encounter (HOSPITAL_COMMUNITY): Payer: Self-pay

## 2018-12-07 DIAGNOSIS — M1990 Unspecified osteoarthritis, unspecified site: Secondary | ICD-10-CM | POA: Insufficient documentation

## 2018-12-07 DIAGNOSIS — H3342 Traction detachment of retina, left eye: Secondary | ICD-10-CM | POA: Diagnosis present

## 2018-12-07 DIAGNOSIS — E785 Hyperlipidemia, unspecified: Secondary | ICD-10-CM | POA: Insufficient documentation

## 2018-12-07 DIAGNOSIS — Z8673 Personal history of transient ischemic attack (TIA), and cerebral infarction without residual deficits: Secondary | ICD-10-CM | POA: Diagnosis not present

## 2018-12-07 DIAGNOSIS — I10 Essential (primary) hypertension: Secondary | ICD-10-CM | POA: Insufficient documentation

## 2018-12-07 DIAGNOSIS — E1143 Type 2 diabetes mellitus with diabetic autonomic (poly)neuropathy: Secondary | ICD-10-CM | POA: Diagnosis not present

## 2018-12-07 DIAGNOSIS — K3184 Gastroparesis: Secondary | ICD-10-CM | POA: Insufficient documentation

## 2018-12-07 DIAGNOSIS — E113532 Type 2 diabetes mellitus with proliferative diabetic retinopathy with traction retinal detachment not involving the macula, left eye: Secondary | ICD-10-CM | POA: Insufficient documentation

## 2018-12-07 DIAGNOSIS — Z794 Long term (current) use of insulin: Secondary | ICD-10-CM | POA: Diagnosis not present

## 2018-12-07 DIAGNOSIS — Z87891 Personal history of nicotine dependence: Secondary | ICD-10-CM | POA: Diagnosis not present

## 2018-12-07 HISTORY — DX: Headache, unspecified: R51.9

## 2018-12-07 HISTORY — DX: Other intervertebral disc degeneration, lumbar region: M51.36

## 2018-12-07 HISTORY — PX: PHOTOCOAGULATION WITH LASER: SHX6027

## 2018-12-07 HISTORY — DX: Headache: R51

## 2018-12-07 HISTORY — DX: Polyneuropathy, unspecified: G62.9

## 2018-12-07 HISTORY — DX: Cerebral infarction, unspecified: I63.9

## 2018-12-07 HISTORY — DX: Other intervertebral disc degeneration, lumbar region without mention of lumbar back pain or lower extremity pain: M51.369

## 2018-12-07 HISTORY — PX: MEMBRANE PEEL: SHX5967

## 2018-12-07 HISTORY — PX: REPAIR OF COMPLEX TRACTION RETINAL DETACHMENT: SHX6217

## 2018-12-07 HISTORY — PX: GAS/FLUID EXCHANGE: SHX5334

## 2018-12-07 HISTORY — DX: Depression, unspecified: F32.A

## 2018-12-07 HISTORY — DX: Major depressive disorder, single episode, unspecified: F32.9

## 2018-12-07 LAB — CBC
HCT: 33.7 % — ABNORMAL LOW (ref 39.0–52.0)
Hemoglobin: 10.6 g/dL — ABNORMAL LOW (ref 13.0–17.0)
MCH: 27.1 pg (ref 26.0–34.0)
MCHC: 31.5 g/dL (ref 30.0–36.0)
MCV: 86.2 fL (ref 80.0–100.0)
Platelets: 349 10*3/uL (ref 150–400)
RBC: 3.91 MIL/uL — ABNORMAL LOW (ref 4.22–5.81)
RDW: 14.4 % (ref 11.5–15.5)
WBC: 7.9 10*3/uL (ref 4.0–10.5)
nRBC: 0 % (ref 0.0–0.2)

## 2018-12-07 LAB — BASIC METABOLIC PANEL
ANION GAP: 10 (ref 5–15)
BUN: 18 mg/dL (ref 6–20)
CO2: 26 mmol/L (ref 22–32)
Calcium: 9.6 mg/dL (ref 8.9–10.3)
Chloride: 107 mmol/L (ref 98–111)
Creatinine, Ser: 1.42 mg/dL — ABNORMAL HIGH (ref 0.61–1.24)
GFR calc Af Amer: >60 mL/min (ref >60)
GFR calc non Af Amer: 54 mL/min — ABNORMAL LOW (ref >60)
Glucose, Bld: 121 mg/dL — ABNORMAL HIGH (ref 70–99)
Potassium: 3.1 mmol/L — ABNORMAL LOW (ref 3.5–5.1)
Sodium: 143 mmol/L (ref 135–145)

## 2018-12-07 LAB — GLUCOSE, CAPILLARY
GLUCOSE-CAPILLARY: 125 mg/dL — AB (ref 70–99)
GLUCOSE-CAPILLARY: 90 mg/dL (ref 70–99)

## 2018-12-07 SURGERY — REPAIR, RETINAL DETACHMENT, COMPLEX
Anesthesia: Monitor Anesthesia Care | Site: Eye | Laterality: Left

## 2018-12-07 MED ORDER — FENTANYL CITRATE (PF) 250 MCG/5ML IJ SOLN
INTRAMUSCULAR | Status: AC
  Filled 2018-12-07: qty 5

## 2018-12-07 MED ORDER — HYALURONIDASE HUMAN 150 UNIT/ML IJ SOLN
INTRAMUSCULAR | Status: DC | PRN
  Administered 2018-12-07: 150 [IU] via SUBCUTANEOUS

## 2018-12-07 MED ORDER — BUPIVACAINE HCL (PF) 0.75 % IJ SOLN
INTRAMUSCULAR | Status: AC
  Filled 2018-12-07: qty 10

## 2018-12-07 MED ORDER — LIDOCAINE HCL 4 % EX SOLN
Freq: Once | CUTANEOUS | Status: DC
  Filled 2018-12-07: qty 50

## 2018-12-07 MED ORDER — TRIAMCINOLONE ACETONIDE 40 MG/ML IJ SUSP
INTRAMUSCULAR | Status: DC | PRN
  Administered 2018-12-07: 40 mg via INTRAMUSCULAR

## 2018-12-07 MED ORDER — PROPARACAINE HCL 0.5 % OP SOLN
1.0000 [drp] | Freq: Once | OPHTHALMIC | Status: AC
  Administered 2018-12-07: 1 [drp] via OPHTHALMIC
  Filled 2018-12-07: qty 15

## 2018-12-07 MED ORDER — CEFAZOLIN SODIUM 1 G IJ SOLR
INTRAMUSCULAR | Status: DC | PRN
  Administered 2018-12-07: 200 mg via INTRAMUSCULAR

## 2018-12-07 MED ORDER — EPINEPHRINE PF 1 MG/ML IJ SOLN
INTRAMUSCULAR | Status: AC
  Filled 2018-12-07: qty 1

## 2018-12-07 MED ORDER — PHENYLEPHRINE HCL 2.5 % OP SOLN
1.0000 [drp] | OPHTHALMIC | Status: AC | PRN
  Administered 2018-12-07 (×3): 1 [drp] via OPHTHALMIC
  Filled 2018-12-07: qty 2

## 2018-12-07 MED ORDER — TETRACAINE HCL 0.5 % OP SOLN
OPHTHALMIC | Status: AC
  Filled 2018-12-07: qty 4

## 2018-12-07 MED ORDER — OFLOXACIN 0.3 % OP SOLN
1.0000 [drp] | OPHTHALMIC | Status: AC | PRN
  Administered 2018-12-07: 1 [drp] via OPHTHALMIC
  Filled 2018-12-07 (×2): qty 5

## 2018-12-07 MED ORDER — STERILE WATER FOR INJECTION IJ SOLN
INTRAMUSCULAR | Status: AC
  Filled 2018-12-07: qty 10

## 2018-12-07 MED ORDER — LIDOCAINE HCL (PF) 1 % IJ SOLN
INTRAMUSCULAR | Status: AC
  Filled 2018-12-07: qty 5

## 2018-12-07 MED ORDER — PHENYLEPHRINE HCL 10 % OP SOLN: Freq: Once | OPHTHALMIC | Status: DC

## 2018-12-07 MED ORDER — DEXAMETHASONE SODIUM PHOSPHATE 10 MG/ML IJ SOLN
INTRAMUSCULAR | Status: AC
  Filled 2018-12-07: qty 1

## 2018-12-07 MED ORDER — SODIUM HYALURONATE 10 MG/ML IO SOLN
INTRAOCULAR | Status: AC
  Filled 2018-12-07: qty 0.85

## 2018-12-07 MED ORDER — MIDAZOLAM HCL 5 MG/5ML IJ SOLN
INTRAMUSCULAR | Status: DC | PRN
  Administered 2018-12-07: 2 mg via INTRAVENOUS

## 2018-12-07 MED ORDER — PROMETHAZINE HCL 25 MG/ML IJ SOLN: 6.2500 mg | INTRAMUSCULAR | Status: DC | PRN

## 2018-12-07 MED ORDER — EPINEPHRINE PF 1 MG/ML IJ SOLN
INTRAOCULAR | Status: DC | PRN
  Administered 2018-12-07: 500 mL

## 2018-12-07 MED ORDER — LIDOCAINE HCL 2 % IJ SOLN
INTRAMUSCULAR | Status: DC | PRN
  Administered 2018-12-07: 6 mL via OPHTHALMIC

## 2018-12-07 MED ORDER — HYALURONIDASE HUMAN 150 UNIT/ML IJ SOLN
INTRAMUSCULAR | Status: AC
  Filled 2018-12-07: qty 1

## 2018-12-07 MED ORDER — PROPOFOL 10 MG/ML IV BOLUS
INTRAVENOUS | Status: DC | PRN
  Administered 2018-12-07: 20 mg via INTRAVENOUS

## 2018-12-07 MED ORDER — TRIAMCINOLONE ACETONIDE 40 MG/ML IJ SUSP
INTRAMUSCULAR | Status: AC
  Filled 2018-12-07: qty 5

## 2018-12-07 MED ORDER — NA CHONDROIT SULF-NA HYALURON 40-30 MG/ML IO SOLN
INTRAOCULAR | Status: AC
  Filled 2018-12-07: qty 0.5

## 2018-12-07 MED ORDER — MIDAZOLAM HCL 2 MG/2ML IJ SOLN
INTRAMUSCULAR | Status: AC
  Filled 2018-12-07: qty 2

## 2018-12-07 MED ORDER — ONDANSETRON HCL 4 MG/2ML IJ SOLN
INTRAMUSCULAR | Status: DC | PRN
  Administered 2018-12-07: 4 mg via INTRAVENOUS

## 2018-12-07 MED ORDER — SODIUM HYALURONATE 10 MG/ML IO SOLN
INTRAOCULAR | Status: DC | PRN
  Administered 2018-12-07: 0.85 mL via INTRAOCULAR

## 2018-12-07 MED ORDER — CYCLOPENTOLATE HCL 1 % OP SOLN
1.0000 [drp] | OPHTHALMIC | Status: AC | PRN
  Administered 2018-12-07 (×3): 1 [drp] via OPHTHALMIC
  Filled 2018-12-07: qty 2

## 2018-12-07 MED ORDER — TOBRAMYCIN-DEXAMETHASONE 0.3-0.1 % OP OINT
TOPICAL_OINTMENT | OPHTHALMIC | Status: AC
  Filled 2018-12-07: qty 3.5

## 2018-12-07 MED ORDER — LIDOCAINE HCL (PF) 2 % IJ SOLN
INTRAMUSCULAR | Status: AC
  Filled 2018-12-07: qty 10

## 2018-12-07 MED ORDER — DEXAMETHASONE SODIUM PHOSPHATE 10 MG/ML IJ SOLN
INTRAMUSCULAR | Status: DC | PRN
  Administered 2018-12-07: 10 mg via INTRAVENOUS

## 2018-12-07 MED ORDER — SODIUM CHLORIDE 0.9 % IV SOLN
INTRAVENOUS | Status: DC
  Administered 2018-12-07 (×2): via INTRAVENOUS

## 2018-12-07 MED ORDER — OXYCODONE HCL 5 MG PO TABS: 5.0000 mg | ORAL_TABLET | Freq: Once | ORAL | Status: DC | PRN

## 2018-12-07 MED ORDER — CEFAZOLIN SUBCONJUNCTIVAL INJECTION 100 MG/0.5 ML
200.0000 mg | INJECTION | SUBCONJUNCTIVAL | Status: DC
  Filled 2018-12-07: qty 5

## 2018-12-07 MED ORDER — LACTATED RINGERS IV SOLN: INTRAVENOUS | Status: DC | PRN

## 2018-12-07 MED ORDER — HYDROMORPHONE HCL 1 MG/ML IJ SOLN: 0.2500 mg | INTRAMUSCULAR | Status: DC | PRN

## 2018-12-07 MED ORDER — BSS PLUS IO SOLN
INTRAOCULAR | Status: AC
  Filled 2018-12-07: qty 500

## 2018-12-07 MED ORDER — OXYCODONE HCL 5 MG/5ML PO SOLN: 5.0000 mg | Freq: Once | ORAL | Status: DC | PRN

## 2018-12-07 SURGICAL SUPPLY — 66 items
APL SRG 3 HI ABS STRL LF PLS (MISCELLANEOUS) ×1
APPLICATOR COTTON TIP 6IN STRL (MISCELLANEOUS) ×3 IMPLANT
APPLICATOR DR MATTHEWS STRL (MISCELLANEOUS) ×3 IMPLANT
BLADE MINI 60D BLUE (BLADE) ×3 IMPLANT
BLADE MINI RND TIP GREEN BEAV (BLADE) IMPLANT
CANNULA ANT CHAM MAIN (OPHTHALMIC RELATED) IMPLANT
CANNULA DUALBORE 25G (CANNULA) ×3 IMPLANT
CANNULA VLV SOFT TIP 25G (OPHTHALMIC) IMPLANT
CANNULA VLV SOFT TIP 25GA (OPHTHALMIC) ×3 IMPLANT
CAUTERY EYE LOW TEMP 1300F FIN (OPHTHALMIC RELATED) IMPLANT
CLOSURE WOUND 1/2 X4 (GAUZE/BANDAGES/DRESSINGS) ×1
CORDS BIPOLAR (ELECTRODE) IMPLANT
COVER SURGICAL LIGHT HANDLE (MISCELLANEOUS) ×3 IMPLANT
COVER WAND RF STERILE (DRAPES) ×3 IMPLANT
FILTER BLUE MILLIPORE (MISCELLANEOUS) IMPLANT
FILTER STRAW FLUID ASPIR (MISCELLANEOUS) IMPLANT
FORCEPS GRIESHABER ILM 25G A (INSTRUMENTS) ×2 IMPLANT
GAS AUTO FILL CONSTEL (OPHTHALMIC) ×3
GAS AUTO FILL CONSTELLATION (OPHTHALMIC) IMPLANT
GAS IO SF6 125GM ISPAN CNSTL (MISCELLANEOUS) IMPLANT
GAS OPHTHALMIC (MISCELLANEOUS) IMPLANT
GAS TANK CONSTELL (MISCELLANEOUS) ×3
GLOVE ECLIPSE 7.5 STRL STRAW (GLOVE) ×3 IMPLANT
GOWN STRL REUS W/ TWL LRG LVL3 (GOWN DISPOSABLE) ×2 IMPLANT
GOWN STRL REUS W/TWL LRG LVL3 (GOWN DISPOSABLE) ×6
KIT BASIN OR (CUSTOM PROCEDURE TRAY) ×3 IMPLANT
KIT PERFLUORON PROCEDURE 5ML (MISCELLANEOUS) IMPLANT
KIT TURNOVER KIT B (KITS) ×3 IMPLANT
NDL 18GX1X1/2 (RX/OR ONLY) (NEEDLE) ×1 IMPLANT
NDL FILTER BLUNT 18X1 1/2 (NEEDLE) IMPLANT
NDL HYPO 18GX1.5 BLUNT FILL (NEEDLE) IMPLANT
NDL HYPO 25GX1X1/2 BEV (NEEDLE) IMPLANT
NDL HYPO 30X.5 LL (NEEDLE) ×2 IMPLANT
NDL RETROBULBAR 25GX1.5 (NEEDLE) IMPLANT
NEEDLE 18GX1X1/2 (RX/OR ONLY) (NEEDLE) ×3 IMPLANT
NEEDLE FILTER BLUNT 18X 1/2SAF (NEEDLE) ×2
NEEDLE FILTER BLUNT 18X1 1/2 (NEEDLE) ×1 IMPLANT
NEEDLE HYPO 18GX1.5 BLUNT FILL (NEEDLE) ×3 IMPLANT
NEEDLE HYPO 25GX1X1/2 BEV (NEEDLE) IMPLANT
NEEDLE HYPO 30X.5 LL (NEEDLE) ×9 IMPLANT
NEEDLE RETROBULBAR 25GX1.5 (NEEDLE) ×3 IMPLANT
NS IRRIG 1000ML POUR BTL (IV SOLUTION) ×3 IMPLANT
PACK VITRECTOMY CUSTOM (CUSTOM PROCEDURE TRAY) ×3 IMPLANT
PAD ARMBOARD 7.5X6 YLW CONV (MISCELLANEOUS) ×6 IMPLANT
PAK VITRECTOMY PIK 25 GA (OPHTHALMIC RELATED) IMPLANT
REPL STRA BRUSH NDL (NEEDLE) IMPLANT
REPL STRA BRUSH NEEDLE (NEEDLE) IMPLANT
RESERVOIR BACK FLUSH (MISCELLANEOUS) IMPLANT
RETRACTOR IRIS FLEX 25G GRIESH (INSTRUMENTS) IMPLANT
ROLLS DENTAL (MISCELLANEOUS) IMPLANT
SET FLUID INJECTOR (SET/KITS/TRAYS/PACK) IMPLANT
SHEET MEDIUM DRAPE 40X70 STRL (DRAPES) ×3 IMPLANT
SHIELD EYE LENSE ONLY DISP (GAUZE/BANDAGES/DRESSINGS) ×2 IMPLANT
SOLUTION ANTI FOG 6CC (MISCELLANEOUS) ×3 IMPLANT
STOCKINETTE IMPERVIOUS 9X36 MD (GAUZE/BANDAGES/DRESSINGS) ×6 IMPLANT
STOPCOCK 4 WAY LG BORE MALE ST (IV SETS) IMPLANT
STRIP CLOSURE SKIN 1/2X4 (GAUZE/BANDAGES/DRESSINGS) ×1 IMPLANT
SUT ETHILON 5.0 S-24 (SUTURE) ×3 IMPLANT
SUT SILK 2 0 (SUTURE) ×3
SUT SILK 2-0 18XBRD TIE 12 (SUTURE) ×1 IMPLANT
SUT VICRYL 7 0 TG140 8 (SUTURE) IMPLANT
SYR 20CC LL (SYRINGE) IMPLANT
SYR TB 1ML LUER SLIP (SYRINGE) ×2 IMPLANT
TOWEL OR 17X24 6PK STRL BLUE (TOWEL DISPOSABLE) ×6 IMPLANT
WATER STERILE IRR 1000ML POUR (IV SOLUTION) ×3 IMPLANT
WIPE INSTRUMENT VISIWIPE 73X73 (MISCELLANEOUS) IMPLANT

## 2018-12-07 NOTE — Progress Notes (Signed)
Placed green gas bracelet on patients left wrist.

## 2018-12-07 NOTE — Brief Op Note (Signed)
12/07/2018  2:59 PM  PATIENT:  William Schroeder  58 y.o. male  PRE-OPERATIVE DIAGNOSIS:  Tractional retinal detatchment left eye  POST-OPERATIVE DIAGNOSIS:  Tractional retinal detatchment left eye  PROCEDURE:  Procedure(s): REPAIR OF COMPLEX TRACTION RETINAL DETACHMENT with 25g VITRECTOMY (Left) Membrane Peel (Left), Membranectomy (Left) Photocoagulation With Laser (Left) Gas/Fluid Exchange (Left), SF6 Gas Tamponade (Left)  SURGEON:  Surgeon(s) and Role:    * Jalene Mullet, MD - Primary  PHYSICIAN ASSISTANT:   ASSISTANTS: none   ANESTHESIA:   local and MAC  EBL:  minimal   BLOOD ADMINISTERED:none  DRAINS: none   LOCAL MEDICATIONS USED:  MARCAINE    and LIDOCAINE   SPECIMEN:  No Specimen  DISPOSITION OF SPECIMEN:  N/A  COUNTS:  YES  TOURNIQUET:  * No tourniquets in log *  DICTATION: .Note written in EPIC  PLAN OF CARE: Discharge to home after PACU  PATIENT DISPOSITION:  PACU - hemodynamically stable.   Delay start of Pharmacological VTE agent (>24hrs) due to surgical blood loss or risk of bleeding: not applicable

## 2018-12-07 NOTE — Transfer of Care (Signed)
Immediate Anesthesia Transfer of Care Note  Patient: William Schroeder  Procedure(s) Performed: REPAIR OF COMPLEX TRACTION RETINAL DETACHMENT with 25g VITRECTOMY (Left Eye) Membrane Peel (Left Eye) Photocoagulation With Laser (Left Eye) Gas/Fluid Exchange (Left Eye)  Patient Location: PACU  Anesthesia Type:MAC  Level of Consciousness: awake, alert  and oriented  Airway & Oxygen Therapy: Patient Spontanous Breathing  Post-op Assessment: Report given to RN  Post vital signs: Reviewed and stable  Last Vitals:  Vitals Value Taken Time  BP 179/98 12/07/2018  2:59 PM  Temp    Pulse 84 12/07/2018  2:59 PM  Resp 60 12/07/2018  2:59 PM  SpO2 92 % 12/07/2018  2:59 PM  Vitals shown include unvalidated device data.  Last Pain:  Vitals:   12/07/18 1027  TempSrc: Oral  PainSc: 0-No pain         Complications: No apparent anesthesia complications

## 2018-12-07 NOTE — Progress Notes (Signed)
Pt stated that he took four (4) medications this morning, but does not know what they are.  Wife is the one that gave him the medications.  Unable to get a hold of wife on the phone.

## 2018-12-07 NOTE — Discharge Instructions (Addendum)
General Anesthesia, Adult, Care After °This sheet gives you information about how to care for yourself after your procedure. Your health care provider may also give you more specific instructions. If you have problems or questions, contact your health care provider. °What can I expect after the procedure? °After the procedure, the following side effects are common: °· Pain or discomfort at the IV site. °· Nausea. °· Vomiting. °· Sore throat. °· Trouble concentrating. °· Feeling cold or chills. °· Weak or tired. °· Sleepiness and fatigue. °· Soreness and body aches. These side effects can affect parts of the body that were not involved in surgery. °Follow these instructions at home: ° °For at least 24 hours after the procedure: °· Have a responsible adult stay with you. It is important to have someone help care for you until you are awake and alert. °· Rest as needed. °· Do not: °? Participate in activities in which you could fall or become injured. °? Drive. °? Use heavy machinery. °? Drink alcohol. °? Take sleeping pills or medicines that cause drowsiness. °? Make important decisions or sign legal documents. °? Take care of children on your own. °Eating and drinking °· Follow any instructions from your health care provider about eating or drinking restrictions. °· When you feel hungry, start by eating small amounts of foods that are soft and easy to digest (bland), such as toast. Gradually return to your regular diet. °· Drink enough fluid to keep your urine pale yellow. °· If you vomit, rehydrate by drinking water, juice, or clear broth. °General instructions °· If you have sleep apnea, surgery and certain medicines can increase your risk for breathing problems. Follow instructions from your health care provider about wearing your sleep device: °? Anytime you are sleeping, including during daytime naps. °? While taking prescription pain medicines, sleeping medicines, or medicines that make you drowsy. °· Return to  your normal activities as told by your health care provider. Ask your health care provider what activities are safe for you. °· Take over-the-counter and prescription medicines only as told by your health care provider. °· If you smoke, do not smoke without supervision. °· Keep all follow-up visits as told by your health care provider. This is important. °Contact a health care provider if: °· You have nausea or vomiting that does not get better with medicine. °· You cannot eat or drink without vomiting. °· You have pain that does not get better with medicine. °· You are unable to pass urine. °· You develop a skin rash. °· You have a fever. °· You have redness around your IV site that gets worse. °Get help right away if: °· You have difficulty breathing. °· You have chest pain. °· You have blood in your urine or stool, or you vomit blood. °Summary °· After the procedure, it is common to have a sore throat or nausea. It is also common to feel tired. °· Have a responsible adult stay with you for the first 24 hours after general anesthesia. It is important to have someone help care for you until you are awake and alert. °· When you feel hungry, start by eating small amounts of foods that are soft and easy to digest (bland), such as toast. Gradually return to your regular diet. °· Drink enough fluid to keep your urine pale yellow. °· Return to your normal activities as told by your health care provider. Ask your health care provider what activities are safe for you. °This information is not   intended to replace advice given to you by your health care provider. Make sure you discuss any questions you have with your health care provider. Document Released: 12/01/2000 Document Revised: 04/10/2017 Document Reviewed: 04/10/2017 [employer] Interactive Patient Education  2019 [employer]    DO NOT SLEEP ON BACK, THE EYE PRESSURE CAN GO UP AND CAUSE VISION LOSS   SLEEP ON SIDE WITH NOSE TO PILLOW  DURING DAY KEEP FACE  DOWN.  15 MINUTES EVERY 2 HOURS IT IS OK TO LOOK STRAIGHT AHEAD (USE BATHROOM, EAT, WALK, ETC.)

## 2018-12-07 NOTE — Anesthesia Preprocedure Evaluation (Signed)
Anesthesia Evaluation  Patient identified by MRN, date of birth, ID band Patient awake    Reviewed: Allergy & Precautions, NPO status , Patient's Chart, lab work & pertinent test results  Airway Mallampati: II  TM Distance: >3 FB     Dental  (+) Partial Upper   Pulmonary former smoker,    breath sounds clear to auscultation       Cardiovascular hypertension, Pt. on medications  Rhythm:Regular Rate:Normal     Neuro/Psych PSYCHIATRIC DISORDERS Depression  Neuromuscular disease    GI/Hepatic GERD  Medicated,  Endo/Other  diabetes, Type 2, Insulin Dependent  Renal/GU      Musculoskeletal  (+) Arthritis , Osteoarthritis,    Abdominal   Peds  Hematology   Anesthesia Other Findings   Reproductive/Obstetrics                             Anesthesia Physical  Anesthesia Plan  ASA: III  Anesthesia Plan: MAC   Post-op Pain Management:    Induction: Intravenous  PONV Risk Score and Plan: 1 and Ondansetron  Airway Management Planned: Nasal Cannula  Additional Equipment:   Intra-op Plan:   Post-operative Plan:   Informed Consent: I have reviewed the patients History and Physical, chart, labs and discussed the procedure including the risks, benefits and alternatives for the proposed anesthesia with the patient or authorized representative who has indicated his/her understanding and acceptance.       Plan Discussed with:   Anesthesia Plan Comments:         Anesthesia Quick Evaluation

## 2018-12-07 NOTE — Anesthesia Postprocedure Evaluation (Signed)
Anesthesia Post Note  Patient: OBINNA EHRESMAN  Procedure(s) Performed: REPAIR OF COMPLEX TRACTION RETINAL DETACHMENT with 25g VITRECTOMY (Left Eye) Membrane Peel (Left Eye) Photocoagulation With Laser (Left Eye) Gas/Fluid Exchange (Left Eye)     Patient location during evaluation: PACU Anesthesia Type: MAC Level of consciousness: awake and alert Pain management: pain level controlled Vital Signs Assessment: post-procedure vital signs reviewed and stable Respiratory status: spontaneous breathing, nonlabored ventilation and respiratory function stable Cardiovascular status: stable and blood pressure returned to baseline Postop Assessment: no apparent nausea or vomiting Anesthetic complications: no    Last Vitals:  Vitals:   12/07/18 1515 12/07/18 1530  BP: (!) 144/74 135/68  Pulse: 79 75  Resp: 20 18  Temp:    SpO2: 94% 92%    Last Pain:  Vitals:   12/07/18 1530  TempSrc:   PainSc: 0-No pain                 Catalina Gravel

## 2018-12-08 ENCOUNTER — Encounter (HOSPITAL_COMMUNITY): Payer: Self-pay | Admitting: Ophthalmology

## 2018-12-22 NOTE — Op Note (Signed)
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  A lid speculum was placed.  Infusion line and trocar was placed at the 4 o'clock position approximately 3.5 mm from the surgical limbus.   The infusion line was allowed to run and then clamped when placed at the cannula opening. The line was inserted and secured to the drape with an adhesive strip.   Active trocars/cannula were placed at the 10 and 2 o'clock positions approximately 3.5 mm from the surgical limbus. The cannula was visualized in the vitreous cavity.  The light pipe and vitreous cutter were inserted into the vitreous cavity and a core vitrectomy was performed.  Care taken to remove the vitreous up to the vitreous base for 360 degrees.   Attention was directed toward relieving the tractional detachment from the posterior pole in particular over the macular where the tractional detachment involves the nasal macula.  This was done carefully at the disc and surrounding arcades. There was notable neovascular fronds with traction and associated subretinal hemorrhage superiorly and the nasal macula. Care was taken to elevate the membranes and remove them both with a vitrector and a light pipe with dissection. Hemostasis of the neovascular fronds was performed with endocautery and endolaser. Following hemostasis, continued dissection of membranes and removal of membranes was performed including the temporal and superior retina. Endolaser was applied to the areas where the neovascular fronds  were removed.  3 rows of endolaser were applied 360 degrees to the periphery.  A complete air-fluid exchange was then performed and additional endolaser was applied. 18% SF6 gas was injected into the eye. The trocars were sequentially removed and all were noted to be airtight. Subconjunctival injections of antibiotic and Decadron were placed.   The speculum and drapes were removed and the eye was patched with Polymixin/Bacitracin ophthalmic ointment. An eye shield was placed and the patient was transferred alert and conversant with stable vital signs to the post operative recovery area.  The patient tolerated the procedure well and no complications were noted.

## 2019-11-29 ENCOUNTER — Other Ambulatory Visit: Payer: Self-pay | Admitting: Internal Medicine

## 2019-11-29 ENCOUNTER — Other Ambulatory Visit (HOSPITAL_COMMUNITY): Payer: Self-pay | Admitting: Internal Medicine

## 2019-11-29 DIAGNOSIS — N183 Chronic kidney disease, stage 3 unspecified: Secondary | ICD-10-CM

## 2019-12-16 ENCOUNTER — Ambulatory Visit (HOSPITAL_COMMUNITY)
Admission: RE | Admit: 2019-12-16 | Discharge: 2019-12-16 | Disposition: A | Discharge status: Home / Self Care | Payer: Medicare Other | Source: Ambulatory Visit | Attending: Internal Medicine | Admitting: Internal Medicine

## 2019-12-16 ENCOUNTER — Other Ambulatory Visit: Payer: Self-pay

## 2019-12-16 DIAGNOSIS — N183 Chronic kidney disease, stage 3 unspecified: Secondary | ICD-10-CM | POA: Diagnosis not present

## 2020-03-23 ENCOUNTER — Inpatient Hospital Stay (HOSPITAL_COMMUNITY)
Admission: AD | Admit: 2020-03-23 | Discharge: 2020-04-06 | DRG: 246 | Disposition: A | Discharge status: Skilled Nursing Facility | Payer: Medicare Other | Source: Other Acute Inpatient Hospital | Attending: Internal Medicine | Admitting: Internal Medicine

## 2020-03-23 ENCOUNTER — Encounter (HOSPITAL_COMMUNITY): Payer: Self-pay | Admitting: Family Medicine

## 2020-03-23 DIAGNOSIS — Z8673 Personal history of transient ischemic attack (TIA), and cerebral infarction without residual deficits: Secondary | ICD-10-CM

## 2020-03-23 DIAGNOSIS — E1142 Type 2 diabetes mellitus with diabetic polyneuropathy: Secondary | ICD-10-CM | POA: Diagnosis present

## 2020-03-23 DIAGNOSIS — Z20822 Contact with and (suspected) exposure to covid-19: Secondary | ICD-10-CM | POA: Diagnosis not present

## 2020-03-23 DIAGNOSIS — D631 Anemia in chronic kidney disease: Secondary | ICD-10-CM | POA: Diagnosis present

## 2020-03-23 DIAGNOSIS — I21A1 Myocardial infarction type 2: Secondary | ICD-10-CM | POA: Diagnosis not present

## 2020-03-23 DIAGNOSIS — Z7902 Long term (current) use of antithrombotics/antiplatelets: Secondary | ICD-10-CM | POA: Diagnosis not present

## 2020-03-23 DIAGNOSIS — Z955 Presence of coronary angioplasty implant and graft: Secondary | ICD-10-CM

## 2020-03-23 DIAGNOSIS — N186 End stage renal disease: Secondary | ICD-10-CM

## 2020-03-23 DIAGNOSIS — R0602 Shortness of breath: Secondary | ICD-10-CM | POA: Diagnosis present

## 2020-03-23 DIAGNOSIS — Z7982 Long term (current) use of aspirin: Secondary | ICD-10-CM

## 2020-03-23 DIAGNOSIS — I214 Non-ST elevation (NSTEMI) myocardial infarction: Secondary | ICD-10-CM | POA: Diagnosis not present

## 2020-03-23 DIAGNOSIS — E785 Hyperlipidemia, unspecified: Secondary | ICD-10-CM | POA: Diagnosis present

## 2020-03-23 DIAGNOSIS — K5900 Constipation, unspecified: Secondary | ICD-10-CM | POA: Diagnosis not present

## 2020-03-23 DIAGNOSIS — E1122 Type 2 diabetes mellitus with diabetic chronic kidney disease: Secondary | ICD-10-CM | POA: Diagnosis present

## 2020-03-23 DIAGNOSIS — N179 Acute kidney failure, unspecified: Secondary | ICD-10-CM | POA: Diagnosis present

## 2020-03-23 DIAGNOSIS — I10 Essential (primary) hypertension: Secondary | ICD-10-CM | POA: Diagnosis present

## 2020-03-23 DIAGNOSIS — E875 Hyperkalemia: Secondary | ICD-10-CM | POA: Diagnosis present

## 2020-03-23 DIAGNOSIS — J9601 Acute respiratory failure with hypoxia: Secondary | ICD-10-CM | POA: Diagnosis present

## 2020-03-23 DIAGNOSIS — R5381 Other malaise: Secondary | ICD-10-CM | POA: Diagnosis present

## 2020-03-23 DIAGNOSIS — E8770 Fluid overload, unspecified: Secondary | ICD-10-CM

## 2020-03-23 DIAGNOSIS — I25110 Atherosclerotic heart disease of native coronary artery with unstable angina pectoris: Secondary | ICD-10-CM | POA: Diagnosis not present

## 2020-03-23 DIAGNOSIS — E1169 Type 2 diabetes mellitus with other specified complication: Secondary | ICD-10-CM | POA: Diagnosis not present

## 2020-03-23 DIAGNOSIS — I2510 Atherosclerotic heart disease of native coronary artery without angina pectoris: Secondary | ICD-10-CM | POA: Diagnosis present

## 2020-03-23 DIAGNOSIS — I5021 Acute systolic (congestive) heart failure: Secondary | ICD-10-CM | POA: Diagnosis not present

## 2020-03-23 DIAGNOSIS — E872 Acidosis: Secondary | ICD-10-CM | POA: Diagnosis not present

## 2020-03-23 DIAGNOSIS — K3184 Gastroparesis: Secondary | ICD-10-CM | POA: Diagnosis not present

## 2020-03-23 DIAGNOSIS — I5043 Acute on chronic combined systolic (congestive) and diastolic (congestive) heart failure: Secondary | ICD-10-CM | POA: Diagnosis present

## 2020-03-23 DIAGNOSIS — I5041 Acute combined systolic (congestive) and diastolic (congestive) heart failure: Secondary | ICD-10-CM | POA: Diagnosis not present

## 2020-03-23 DIAGNOSIS — Z794 Long term (current) use of insulin: Secondary | ICD-10-CM

## 2020-03-23 DIAGNOSIS — Z87891 Personal history of nicotine dependence: Secondary | ICD-10-CM

## 2020-03-23 DIAGNOSIS — N185 Chronic kidney disease, stage 5: Secondary | ICD-10-CM | POA: Diagnosis present

## 2020-03-23 DIAGNOSIS — Z79899 Other long term (current) drug therapy: Secondary | ICD-10-CM

## 2020-03-23 DIAGNOSIS — E1143 Type 2 diabetes mellitus with diabetic autonomic (poly)neuropathy: Secondary | ICD-10-CM | POA: Diagnosis present

## 2020-03-23 DIAGNOSIS — I132 Hypertensive heart and chronic kidney disease with heart failure and with stage 5 chronic kidney disease, or end stage renal disease: Principal | ICD-10-CM | POA: Diagnosis present

## 2020-03-23 DIAGNOSIS — E119 Type 2 diabetes mellitus without complications: Secondary | ICD-10-CM

## 2020-03-23 DIAGNOSIS — N289 Disorder of kidney and ureter, unspecified: Secondary | ICD-10-CM | POA: Diagnosis not present

## 2020-03-24 ENCOUNTER — Other Ambulatory Visit: Payer: Self-pay

## 2020-03-24 DIAGNOSIS — I214 Non-ST elevation (NSTEMI) myocardial infarction: Secondary | ICD-10-CM

## 2020-03-24 DIAGNOSIS — N289 Disorder of kidney and ureter, unspecified: Secondary | ICD-10-CM | POA: Diagnosis not present

## 2020-03-24 DIAGNOSIS — E8770 Fluid overload, unspecified: Secondary | ICD-10-CM | POA: Diagnosis not present

## 2020-03-24 LAB — CBC WITH DIFFERENTIAL/PLATELET
Abs Immature Granulocytes: 0.02 10*3/uL (ref 0.00–0.07)
Basophils Absolute: 0.1 10*3/uL (ref 0.0–0.1)
Basophils Relative: 1 %
Eosinophils Absolute: 0.4 10*3/uL (ref 0.0–0.5)
Eosinophils Relative: 5 %
HCT: 29.7 % — ABNORMAL LOW (ref 39.0–52.0)
Hemoglobin: 9.4 g/dL — ABNORMAL LOW (ref 13.0–17.0)
Immature Granulocytes: 0 %
Lymphocytes Relative: 28 %
Lymphs Abs: 2.4 10*3/uL (ref 0.7–4.0)
MCH: 30.2 pg (ref 26.0–34.0)
MCHC: 31.6 g/dL (ref 30.0–36.0)
MCV: 95.5 fL (ref 80.0–100.0)
Monocytes Absolute: 1 10*3/uL (ref 0.1–1.0)
Monocytes Relative: 12 %
Neutro Abs: 4.5 10*3/uL (ref 1.7–7.7)
Neutrophils Relative %: 54 %
Platelets: 186 10*3/uL (ref 150–400)
RBC: 3.11 MIL/uL — ABNORMAL LOW (ref 4.22–5.81)
RDW: 13.5 % (ref 11.5–15.5)
WBC: 8.3 10*3/uL (ref 4.0–10.5)
nRBC: 0 % (ref 0.0–0.2)

## 2020-03-24 LAB — APTT: aPTT: >200 seconds (ref 24–36)

## 2020-03-24 LAB — MAGNESIUM: Magnesium: 2.2 mg/dL (ref 1.7–2.4)

## 2020-03-24 LAB — PROTIME-INR
INR: 1.4 — ABNORMAL HIGH (ref 0.8–1.2)
Prothrombin Time: 16.4 seconds — ABNORMAL HIGH (ref 11.4–15.2)

## 2020-03-24 LAB — BASIC METABOLIC PANEL
Anion gap: 9 (ref 5–15)
BUN: 43 mg/dL — ABNORMAL HIGH (ref 8–23)
CO2: 18 mmol/L — ABNORMAL LOW (ref 22–32)
Calcium: 8.6 mg/dL — ABNORMAL LOW (ref 8.9–10.3)
Chloride: 111 mmol/L (ref 98–111)
Creatinine, Ser: 5.78 mg/dL — ABNORMAL HIGH (ref 0.61–1.24)
GFR calc Af Amer: 11 mL/min — ABNORMAL LOW (ref >60)
GFR calc non Af Amer: 10 mL/min — ABNORMAL LOW (ref >60)
Glucose, Bld: 116 mg/dL — ABNORMAL HIGH (ref 70–99)
Potassium: 4.9 mmol/L (ref 3.5–5.1)
Sodium: 138 mmol/L (ref 135–145)

## 2020-03-24 LAB — GLUCOSE, CAPILLARY
Glucose-Capillary: 111 mg/dL — ABNORMAL HIGH (ref 70–99)
Glucose-Capillary: 108 mg/dL — ABNORMAL HIGH (ref 70–99)
Glucose-Capillary: 108 mg/dL — ABNORMAL HIGH (ref 70–99)
Glucose-Capillary: 170 mg/dL — ABNORMAL HIGH (ref 70–99)
Glucose-Capillary: 143 mg/dL — ABNORMAL HIGH (ref 70–99)

## 2020-03-24 LAB — IRON AND TIBC
Iron: 167 ug/dL (ref 45–182)
Saturation Ratios: 36 % (ref 17.9–39.5)
TIBC: 462 ug/dL — ABNORMAL HIGH (ref 250–450)
UIBC: 295 ug/dL

## 2020-03-24 LAB — HEMOGLOBIN A1C
Hgb A1c MFr Bld: 7.2 % — ABNORMAL HIGH (ref 4.8–5.6)
Mean Plasma Glucose: 159.94 mg/dL

## 2020-03-24 LAB — TROPONIN I (HIGH SENSITIVITY)
Troponin I (High Sensitivity): 7090 ng/L (ref <18)
Troponin I (High Sensitivity): 7421 ng/L (ref <18)

## 2020-03-24 LAB — PHOSPHORUS: Phosphorus: 5.7 mg/dL — ABNORMAL HIGH (ref 2.5–4.6)

## 2020-03-24 LAB — HIV ANTIBODY (ROUTINE TESTING W REFLEX): HIV Screen 4th Generation wRfx: NONREACTIVE

## 2020-03-24 LAB — FERRITIN: Ferritin: 26 ng/mL (ref 24–336)

## 2020-03-24 LAB — HEPARIN LEVEL (UNFRACTIONATED): Heparin Unfractionated: 0.51 IU/mL (ref 0.30–0.70)

## 2020-03-24 MED ORDER — FUROSEMIDE 10 MG/ML IJ SOLN
120.0000 mg | Freq: Two times a day (BID) | INTRAVENOUS | Status: DC
  Filled 2020-03-24 (×2): qty 12

## 2020-03-24 MED ORDER — ASPIRIN EC 81 MG PO TBEC
81.0000 mg | DELAYED_RELEASE_TABLET | Freq: Every day | ORAL | Status: DC
  Administered 2020-03-24 – 2020-03-25 (×2): 81 mg via ORAL
  Filled 2020-03-24 (×2): qty 1

## 2020-03-24 MED ORDER — HYDRALAZINE HCL 25 MG PO TABS
25.0000 mg | ORAL_TABLET | Freq: Three times a day (TID) | ORAL | Status: DC
  Administered 2020-03-24 – 2020-03-26 (×6): 25 mg via ORAL
  Filled 2020-03-24 (×6): qty 1

## 2020-03-24 MED ORDER — NICOTINE 14 MG/24HR TD PT24
14.0000 mg | MEDICATED_PATCH | Freq: Every day | TRANSDERMAL | Status: DC
  Administered 2020-03-24 – 2020-04-06 (×14): 14 mg via TRANSDERMAL
  Filled 2020-03-24 (×14): qty 1

## 2020-03-24 MED ORDER — HEPARIN BOLUS VIA INFUSION
4000.0000 [IU] | Freq: Once | INTRAVENOUS | Status: AC
  Administered 2020-03-24: 4000 [IU] via INTRAVENOUS
  Filled 2020-03-24: qty 4000

## 2020-03-24 MED ORDER — SODIUM CHLORIDE 0.9% FLUSH
3.0000 mL | Freq: Two times a day (BID) | INTRAVENOUS | Status: DC
  Administered 2020-03-24 (×2): 3 mL via INTRAVENOUS

## 2020-03-24 MED ORDER — INSULIN ASPART 100 UNIT/ML ~~LOC~~ SOLN
0.0000 [IU] | SUBCUTANEOUS | Status: DC
  Administered 2020-03-24: 1 [IU] via SUBCUTANEOUS

## 2020-03-24 MED ORDER — SODIUM CHLORIDE 0.9% FLUSH: 3.0000 mL | INTRAVENOUS | Status: DC | PRN

## 2020-03-24 MED ORDER — ACETAMINOPHEN 325 MG PO TABS: 650.0000 mg | ORAL_TABLET | ORAL | Status: DC | PRN

## 2020-03-24 MED ORDER — FUROSEMIDE 10 MG/ML IJ SOLN
120.0000 mg | Freq: Once | INTRAVENOUS | Status: AC
  Administered 2020-03-24: 120 mg via INTRAVENOUS
  Filled 2020-03-24: qty 10

## 2020-03-24 MED ORDER — ONDANSETRON HCL 4 MG/2ML IJ SOLN: 4.0000 mg | Freq: Four times a day (QID) | INTRAMUSCULAR | Status: DC | PRN

## 2020-03-24 MED ORDER — INSULIN ASPART 100 UNIT/ML ~~LOC~~ SOLN
0.0000 [IU] | Freq: Three times a day (TID) | SUBCUTANEOUS | Status: DC
  Administered 2020-03-25 – 2020-03-31 (×9): 1 [IU] via SUBCUTANEOUS
  Administered 2020-04-01: 2 [IU] via SUBCUTANEOUS
  Administered 2020-04-01 – 2020-04-04 (×6): 1 [IU] via SUBCUTANEOUS
  Administered 2020-04-04: 3 [IU] via SUBCUTANEOUS
  Administered 2020-04-05 (×2): 1 [IU] via SUBCUTANEOUS

## 2020-03-24 MED ORDER — SODIUM CHLORIDE 0.9 % IV SOLN: 250.0000 mL | INTRAVENOUS | Status: DC | PRN

## 2020-03-24 MED ORDER — ATORVASTATIN CALCIUM 80 MG PO TABS
80.0000 mg | ORAL_TABLET | Freq: Every day | ORAL | Status: DC
  Administered 2020-03-24 – 2020-03-28 (×5): 80 mg via ORAL
  Filled 2020-03-24 (×5): qty 1

## 2020-03-24 MED ORDER — FUROSEMIDE 10 MG/ML IJ SOLN
120.0000 mg | Freq: Two times a day (BID) | INTRAVENOUS | Status: DC
  Administered 2020-03-24 – 2020-03-25 (×2): 120 mg via INTRAVENOUS
  Filled 2020-03-24: qty 10
  Filled 2020-03-24: qty 12
  Filled 2020-03-24: qty 10
  Filled 2020-03-24: qty 12

## 2020-03-24 MED ORDER — HEPARIN SODIUM (PORCINE) 5000 UNIT/ML IJ SOLN
5000.0000 [IU] | Freq: Three times a day (TID) | INTRAMUSCULAR | Status: DC
  Administered 2020-03-24: 5000 [IU] via SUBCUTANEOUS
  Filled 2020-03-24: qty 1

## 2020-03-24 MED ORDER — HEPARIN (PORCINE) 25000 UT/250ML-% IV SOLN
1400.0000 [IU]/h | INTRAVENOUS | Status: DC
  Administered 2020-03-24 – 2020-03-26 (×3): 1400 [IU]/h via INTRAVENOUS
  Filled 2020-03-24 (×3): qty 250

## 2020-03-24 MED ORDER — ISOSORBIDE MONONITRATE ER 30 MG PO TB24
15.0000 mg | ORAL_TABLET | Freq: Every day | ORAL | Status: DC
  Administered 2020-03-24 – 2020-03-29 (×6): 15 mg via ORAL
  Filled 2020-03-24 (×6): qty 1

## 2020-03-24 NOTE — Consult Note (Signed)
William Schroeder Admit Date: 03/23/2020 03/24/2020 Rexene Agent Requesting Physician:  Rodena Piety MD  Reason for Consult:  CKD5, pulm edema, hypoxia HPI:  58M CKD5 followed by [facility] in [place] s/p LUE AVF at [place] 3wk ago, DM2, HTN on ACEi, hx/o CVA, T user presented to [facility] yesterday with dyspnea, PND/Orthopnea.  Reportedly ED CXR there with pulmonary edema, rec IV lasix with improvement.  Transferred to [facility] for further mgmt. SCr there was 6.2.  01/27/20 SCr was 4.6  He says last eGFR was 11, similar to values here. Breathing better, remains on [place], but able to lie flat.   Denies missing meds or change in diet (but eats a lot of restaurant foods).  Doesn't appear he is on a diuretic?  He is not very knowledgeable about his medical conditions or medications.    Denies use of NSAIDs. No sig N/V/anorexia.  About 1L UOP since presentation.    ROS NSAIDS: denies IV Contrast no exposure Balance of 12 systems is negative w/ exceptions as above  PMH  Past Medical History:  Diagnosis Date  . Arthritis   . Carpal tunnel syndrome 10/18/2014   Bilateral  . Chronic leg pain    since last back surgery  . DDD (degenerative disc disease), lumbar   . Depression   . Diabetes mellitus    Type II  . Gastroparesis   . GERD (gastroesophageal reflux disease)   . Headache   . Hyperlipidemia   . Hypertension   . Neuropathy   . Stroke [facility]) March , July 2017   right arm   PSH  Past Surgical History:  Procedure Laterality Date  . ANKLE FRACTURE SURGERY Left   . BACK SURGERY     two, last one 06/2011  . CATARACT EXTRACTION W/PHACO Right 01/28/2016   Procedure: CATARACT EXTRACTION PHACO AND INTRAOCULAR LENS PLACEMENT (IOC);  Surgeon: Tonny Branch, MD;  Location: [facility];  Service: Ophthalmology;  Laterality: Right;  CDE: 6.18  . CHOLECYSTECTOMY    . COLONOSCOPY  04/2009   Dr. Hulda Humphrey diverticulum at ascending colon  . COLONOSCOPY WITH ESOPHAGOGASTRODUODENOSCOPY (EGD)  08/18/2012   Dr.  Gala Romney: TCS with few colonic diverticulosis, , EGD with short segment biopsy-proven Barrett's, gastric and bulbar erosions. Negative H.pylori. Low-grade dysplasia of Barrett's.   . ESOPHAGOGASTRODUODENOSCOPY  04/2009   Dr. Jearld Shines 26mIV/Demerol 531mV. erosive reflux esophagitis with single patch of salmon colored mucosa at distal esophagus. bx-no definite Barrett's  . GAS/FLUID EXCHANGE Left 12/07/2018   Procedure: Gas/Fluid Exchange;  Surgeon: PaJalene MulletMD;  [place] [facility] [facility];  Laterality: Left;  . LUMBAR FUSION  12/16/2017  . MEMBRANE PEEL Left 12/07/2018   Procedure: MeAntoine Primas Surgeon: PaJalene MulletMD;  [place] [facility] [place]hthal[facility];  Laterality: Left;  . PHOTOCOAGULATION WITH LASER Left 12/07/2018   Procedure: Photocoagulation With Laser;  Surgeon: PaJalene MulletMD;  [place] [facility] [facility]: Ophthalmology;  Laterality: Left;  . REPAIR OF COMPLEX TRACTION RETINAL DETACHMENT Left 12/07/2018   Procedure: REPAIR OF COMPLEX TRACTION RETINAL DETACHMENT with 25g VITRECTOMY;  Surgeon: PaJalene MulletMD;  [place] [facility]: [place];  Laterality: Left;  . STERIOD INJECTION  01/16/2012   Procedure: MINOR STEROID INJECTION;  Surgeon: SyLowella GripMD;  [place] [facility] Service: Orthopedics;  Laterality: Right;  Epidural Steroid Injection  Right Lumbar 3-4 and Right Lumbar 5 - Sacral 1   FH  Family History  Problem Relation Age of Onset  .  Colon cancer Other        uncle  . Pancreatic cancer Sister   . Depression Sister   . Pancreatic cancer Brother   . Depression Brother   . Drug abuse Brother   . Diabetes Mother   . Heart disease Mother   . Kidney disease Mother   . Diabetes Other        9 siblings  . Alcohol abuse Brother    [place]  reports that he quit smoking about 31 years ago. His smoking use included cigarettes. He has a 20.00 pack-year smoking history. His smokeless tobacco use includes  snuff. He reports that he does not drink alcohol and does not use drugs. Allergies No Known Allergies Home medications Prior to Admission medications   Medication Sig Start Date End Date Taking? Authorizing Provider  amLODipine (NORVASC) 10 MG tablet Take 10 mg by mouth daily.    [provider]  aspirin 325 MG tablet Take 325 mg by mouth daily.    [provider]  atorvastatin (LIPITOR) 80 MG tablet Take 80 mg by mouth daily.    [provider]  clopidogrel (PLAVIX) 75 MG tablet Take 75 mg by mouth daily.    [provider]  DULoxetine (CYMBALTA) 60 MG capsule Take 1 capsule (60 mg total) by mouth daily. 03/27/15   Cloria Spring, MD  gabapentin (NEURONTIN) 300 MG capsule Take 800 mg by mouth 3 (three) times daily. Order 836m 4 times a day- he takes it 2 times a day    [provider]  insulin glargine (LANTUS) 100 UNIT/ML injection Inject 0.62 mLs (62 Units total) into the skin at bedtime. Patient taking differently: Inject 73 Units into the skin at bedtime.  07/12/14   DNiel Hummer NP  insulin lispro (HUMALOG) 100 UNIT/ML injection Inject 8-13 Units into the skin 3 (three) times daily before meals. Per sliding scale.    [provider]  insulin NPH-regular Human (70-30) 100 UNIT/ML injection Inject 50 Units into the skin 2 (two) times daily with a meal. Takes 5- 40    [provider]  lisinopril (PRINIVIL,ZESTRIL) 20 MG tablet Take 1 tablet (20 mg total) by mouth daily. 07/12/14   DNiel Hummer NP  metFORMIN (GLUCOPHAGE-XR) 500 MG 24 hr tablet Take 1,000 mg by mouth 2 (two) times daily.     [provider]  simvastatin (ZOCOR) 40 MG tablet Take 40 mg by mouth daily.    [provider]    Current Medications Scheduled Meds: . heparin  5,000 Units Subcutaneous Q8H  . insulin aspart  0-6 Units Subcutaneous Q4H  . sodium chloride flush  3 mL Intravenous Q12H   Continuous Infusions: . sodium chloride      PRN Meds:.sodium chloride, acetaminophen, ondansetron (ZOFRAN) IV, sodium chloride flush  CBC Recent Labs  Lab 03/24/20 0615  WBC 8.3  NEUTROABS 4.5  HGB 9.4*  HCT 29.7*  MCV 95.5  PLT 1827  Basic Metabolic Panel Recent Labs  Lab 03/24/20 0615  NA 138  K 4.9  CL 111  CO2 18*  GLUCOSE 116*  BUN 43*  CREATININE 5.78*  CALCIUM 8.6*    Physical Exam  Blood pressure (!) 155/73, pulse 70, temperature 97.7 F (36.5 C), temperature source Oral, resp. rate 18, height 5' 10"  (1.778 m), weight 100.8 kg, SpO2 98 %. GEN: NAD. Lying flat in bed, nl wob ENT: NCAT EYES: EOMI CV: RRR no rub PULM: Diminished and bronchial in bases, clear  elsewhere ABD: s/nt/nd SKIN: no rashes/lesions; LUE AVF incision well healed EXT:no sig LEE LUE AVF +B/T   Assessment 64M CKD5 with acute hypoxia/SOB likely pulmonary edema, improved  1. CKD5 followed by [facility] / [facility];  Appears is progressive, SCr 4.6 last May.  SOB / Hypoxia / Pulm Edema, imprvoed but not resolved 2. LUE AVF +B/T, healing well 3. DM2 4. HTN, not sure med regimen 5. ANemia, Hb 9s 6. Elevated Troponin / per Primary 7. Anemia  Plan 1. Lasix 120 IV BID 2. No ACEi (seems was stopped at recent neph visit) 3. Check Fe levels 4. Check Phos 5. RD to discuss low Na diet 6. No indication for HD, hopefully can improve and stablize vol status on diuretics to allow further time for AVF maturation 7. Daily weights, Daily Renal Panel, Strict I/Os, Avoid nephrotoxins (NSAIDs, judicious IV Contrast)    Rexene Agent  03/24/2020, 12:17 PM

## 2020-03-24 NOTE — Progress Notes (Signed)
New Admission Note:   Arrival Method: Via [facility] from [facility] Mental Orientation:  A & O x 4 Telemetry: NSR on Tele Box 5M06 Assessment: Completed Skin:  Abrasions to BUE; otherwise, intact IV:  RT AC NSL placed at [facility] Pain:   Denies Tubes:  None Safety Measures: Safety Fall Prevention Plan has been given, discussed and signed Admission: Completed 5 MW Orientation: Patient has been orientated to the room, unit and staff.  Family:  Patient lives with daughter, Lanetta Inch, who is currently hospitalized.  His ex-wife, flew from [place] to be with him and their daughter.    Patient has no belongings at bedside except for upper dentures.  His ex-wife is at bedside.  He has a 17 week old AVF placed in anticipation of hemodialysis to start at the [facility] sometime in August or September.    Orders have been reviewed and implemented. Will continue to monitor the patient. Call light has been placed within reach and bed alarm has been activated.   Earleen Reaper RN- BC, [facility] Phone number: [phone]

## 2020-03-24 NOTE — Progress Notes (Signed)
ANTICOAGULATION CONSULT NOTE   Pharmacy Consult for heparin Indication: chest pain/ACS  No Known Allergies  Patient Measurements: Height: 5\' 10"  (177.8 cm) Weight: 100.8 kg (222 lb 3.6 oz) IBW/kg (Calculated) : 73 Heparin Dosing Weight: 94.1 kg  Vital Signs: Temp: 98.6 F (37 C) (07/17 2044) Temp Source: Oral (07/17 2044) BP: 155/69 (07/17 2044) Pulse Rate: 76 (07/17 2044)  Labs: Recent Labs    03/24/20 0615 03/24/20 1226 03/24/20 1523 03/24/20 2152  HGB 9.4*  --   --   --   HCT 29.7*  --   --   --   PLT 186  --   --   --   APTT  --   --  >200*  --   LABPROT  --   --  16.4*  --   INR  --   --  1.4*  --   HEPARINUNFRC  --   --   --  0.51  CREATININE 5.78*  --   --   --   TROPONINIHS 7,090* 7,421*  --   --     Estimated Creatinine Clearance: 16 mL/min (A) (by C-G formula based on SCr of 5.78 mg/dL (H)).   Medical History: Past Medical History:  Diagnosis Date  . Arthritis   . Carpal tunnel syndrome 10/18/2014   Bilateral  . Chronic leg pain    since last back surgery  . DDD (degenerative disc disease), lumbar   . Depression   . Diabetes mellitus    Type II  . Gastroparesis   . GERD (gastroesophageal reflux disease)   . Headache   . Hyperlipidemia   . Hypertension   . Neuropathy   . Stroke [facility]) March , July 2017   right arm    Medications:  Scheduled:  . aspirin EC  81 mg Oral Daily  . atorvastatin  80 mg Oral Daily  . hydrALAZINE  25 mg Oral TID  . [START ON 03/25/2020] insulin aspart  0-6 Units Subcutaneous TID WC  . isosorbide mononitrate  15 mg Oral Daily  . nicotine  14 mg Transdermal Daily  . sodium chloride flush  3 mL Intravenous Q12H    Assessment: Tayshawn Purnell is a 59 YO male with history of T2DM, CVA, HTN, CKD stage V admitted for SOB, orthopnea, and abdominal distension.  He was transferred from [place] for hypoxia (O2 sat in 80's).    Will begin heparin for anticoagulation due to chest pain/ACS.  Patient will also be  initiated on ASA81 and atorvastatin 80.  [facility] ER labs: proBNP 30,000; trop 1884 MC labs: trops 7090, Hgb 9.4, plt 186, Scr 5.78   7/17 PM update:  Initial heparin level is therapeutic   Goal of Therapy:  Heparin level 0.3-0.7 units/ml Monitor platelets by anticoagulation protocol: Yes   Plan:  Cont heparin at 1400 units/hr Confirmatory heparin level with AM labs  Narda Bonds, PharmD, [facility] [place] Pharmacist Phone: [phone]

## 2020-03-24 NOTE — H&P (Addendum)
History and Physical    SAADIQ POCHE YJE:[id number] DOB: Jan 09, 1961 DOA: 03/23/2020  PCP: [facility]   Patient coming from: Home   Chief Complaint: SOB   HPI: William Schroeder is a 59 y.o. male with medical history significant for insulin-dependent diabetes mellitus, history of CVA, hypertension, and chronic kidney disease stage V, now presenting to the emergency department for evaluation of shortness of breath.  Patient reports some chronic dyspnea and notes that he typically sleeps in a recliner because of shortness of breath and back pain when he tries to lay flat.  His shortness of breath was worse than usual on 03/22/2020, he tried to sleep been a bed that night, but developed severe worsening in his dyspnea and felt as though he cannot catch his breath even when he sat back up.  His ex-wife was with him, noted that he appeared pale and struggling to breathe, and EMS was called.  Patient denies any cough, denies fevers or chills, and has not experienced any chest discomfort or palpitations associated with this.  He denies any leg swelling but reports that his abdomen was swollen recently.  [facility] ED Course: Upon arrival to the ED, patient is found to be saturating in the mid 80s on room air, afebrile, and with stable blood pressure.  EKG featured a sinus rhythm with T wave abnormality.  Chest x-ray was concerning for pulmonary edema.  Chemistry panel was notable for creatinine of 5.37 and potassium 5.3.  proBNP was elevated to 30,000 and troponin elevated to 1884.  Nephrology and cardiology were consulted by the ED physician and admission to [facility] was recommended.  Patient was given 120 mg of IV Lasix in the ED with normalization and potassium, improvement in his oxygen requirement, subjective improvement in his shortness of breath, but his troponin continued to rise, reaching 6000 just prior to transfer.  On arrival to [facility], patient reports that his dyspnea is much  improved, he is able to lay back now, and continues to deny any chest discomfort.  Review of Systems:  All other systems reviewed and apart from HPI, are negative.  Past Medical History:  Diagnosis Date  . Arthritis   . Carpal tunnel syndrome 10/18/2014   Bilateral  . Chronic leg pain    since last back surgery  . DDD (degenerative disc disease), lumbar   . Depression   . Diabetes mellitus    Type II  . Gastroparesis   . GERD (gastroesophageal reflux disease)   . Headache   . Hyperlipidemia   . Hypertension   . Neuropathy   . Stroke [facility]) March , July 2017   right arm    Past Surgical History:  Procedure Laterality Date  . ANKLE FRACTURE SURGERY Left   . BACK SURGERY     two, last one 06/2011  . CATARACT EXTRACTION W/PHACO Right 01/28/2016   Procedure: CATARACT EXTRACTION PHACO AND INTRAOCULAR LENS PLACEMENT (IOC);  Surgeon: Tonny Branch, MD;  Location: [facility];  Service: Ophthalmology;  Laterality: Right;  CDE: 6.18  . CHOLECYSTECTOMY    . COLONOSCOPY  04/2009   Dr. Hulda Humphrey diverticulum at ascending colon  . COLONOSCOPY WITH ESOPHAGOGASTRODUODENOSCOPY (EGD)  08/18/2012   Dr. Gala Romney: TCS with few colonic diverticulosis, , EGD with short segment biopsy-proven Barrett's, gastric and bulbar erosions. Negative H.pylori. Low-grade dysplasia of Barrett's.   . ESOPHAGOGASTRODUODENOSCOPY  04/2009   Dr. Jearld Shines 8mg IV/Demerol 50mg IV. erosive reflux esophagitis with single patch of salmon colored mucosa at distal  esophagus. bx-no definite Barrett's  . GAS/FLUID EXCHANGE Left 12/07/2018   Procedure: Gas/Fluid Exchange;  Surgeon: Jalene Mullet, MD;  Location: [place];  Service: Ophthalmology;  Laterality: Left;  . LUMBAR FUSION  12/16/2017  . MEMBRANE PEEL Left 12/07/2018   Procedure: Antoine Primas;  Surgeon: Jalene Mullet, MD;  Location: [place];  Service: Ophthalmology;  Laterality: Left;  . PHOTOCOAGULATION WITH LASER Left 12/07/2018   Procedure: Photocoagulation With Laser;   Surgeon: Jalene Mullet, MD;  Location: [place];  Service: Ophthalmology;  Laterality: Left;  . REPAIR OF COMPLEX TRACTION RETINAL DETACHMENT Left 12/07/2018   Procedure: REPAIR OF COMPLEX TRACTION RETINAL DETACHMENT with 25g VITRECTOMY;  Surgeon: Jalene Mullet, MD;  Location: [place];  Service: Ophthalmology;  Laterality: Left;  . STERIOD INJECTION  01/16/2012   Procedure: MINOR STEROID INJECTION;  Surgeon: Lowella Grip, MD;  Location: [place];  Service: Orthopedics;  Laterality: Right;  Epidural Steroid Injection  Right Lumbar 3-4 and Right Lumbar 5 - Sacral 1     reports that he quit smoking about 31 years ago. His smoking use included cigarettes. He has a 20.00 pack-year smoking history. His smokeless tobacco use includes snuff. He reports that he does not drink alcohol and does not use drugs.  No Known Allergies  Family History  Problem Relation Age of Onset  . Colon cancer Other        uncle  . Pancreatic cancer Sister   . Depression Sister   . Pancreatic cancer Brother   . Depression Brother   . Drug abuse Brother   . Diabetes Mother   . Heart disease Mother   . Kidney disease Mother   . Diabetes Other        9 siblings  . Alcohol abuse Brother      Prior to Admission medications   Medication Sig Start Date End Date Taking? Authorizing Provider  amLODipine (NORVASC) 10 MG tablet Take 10 mg by mouth daily.    [provider]  aspirin 325 MG tablet Take 325 mg by mouth daily.    [provider]  atorvastatin (LIPITOR) 80 MG tablet Take 80 mg by mouth daily.    [provider]  clopidogrel (PLAVIX) 75 MG tablet Take 75 mg by mouth daily.    [provider]  DULoxetine (CYMBALTA) 60 MG capsule Take 1 capsule (60 mg total) by mouth daily. 03/27/15   Cloria Spring, MD  gabapentin (NEURONTIN) 300 MG capsule Take 800 mg by mouth 3 (three) times daily. Order 800mg  4 times a day- he takes it 2 times a day    [provider]  insulin glargine (LANTUS) 100 UNIT/ML injection Inject 0.62 mLs (62 Units total) into the skin at bedtime. Patient taking differently: Inject 73 Units into the skin at bedtime.  07/12/14   Niel Hummer, NP  insulin lispro (HUMALOG) 100 UNIT/ML injection Inject 8-13 Units into the skin 3 (three) times daily before meals. Per sliding scale.    [provider]  insulin NPH-regular Human (70-30) 100 UNIT/ML injection Inject 50 Units into the skin 2 (two) times daily with a meal. Takes 5- 40    [provider]  lisinopril (PRINIVIL,ZESTRIL) 20 MG tablet Take 1 tablet (20 mg total) by mouth daily. 07/12/14   Niel Hummer, NP  metFORMIN (GLUCOPHAGE-XR) 500 MG 24 hr tablet Take 1,000 mg by mouth 2 (two) times daily.     [provider]  simvastatin (ZOCOR) 40 MG  tablet Take 40 mg by mouth daily.    [provider]    Physical Exam: Vitals:   03/23/20 2230  BP: (!) 157/70  Pulse: 76  Resp: 20  Temp: 98.9 F (37.2 C)  TempSrc: Oral  SpO2: 99%  Weight: 100.8 kg  Height: 5\' 10"  (1.778 m)    Constitutional: NAD, calm  Eyes: PERTLA, lids and conjunctivae normal ENMT: Mucous membranes are moist. Posterior pharynx clear of any exudate or lesions.   Neck: normal, supple, no masses, no thyromegaly Respiratory: no wheezing, no crackles. No accessory muscle use.  Cardiovascular: S1 & S2 heard, regular rate and rhythm. No extremity edema.   Abdomen: No distension, no tenderness, soft. Bowel sounds active.  Musculoskeletal: no clubbing / cyanosis. No joint deformity upper and lower extremities.   Skin: no significant rashes, lesions, ulcers. Warm, dry, well-perfused. Neurologic: CN 2-12 grossly intact. Sensation intact. Moving all extremities.  Psychiatric: Alert and oriented to person, place, and situation. Pleasant and cooperative.    Labs and Imaging on Admission: I have personally reviewed following labs and imaging studies  CBC: No results  for input(s): WBC, NEUTROABS, HGB, HCT, MCV, PLT in the last 168 hours. Basic Metabolic Panel: No results for input(s): NA, K, CL, CO2, GLUCOSE, BUN, CREATININE, CALCIUM, MG, PHOS in the last 168 hours. GFR: CrCl cannot be calculated (Patient's most recent lab result is older than the maximum 21 days allowed.). Liver Function Tests: No results for input(s): AST, ALT, ALKPHOS, BILITOT, PROT, ALBUMIN in the last 168 hours. No results for input(s): LIPASE, AMYLASE in the last 168 hours. No results for input(s): AMMONIA in the last 168 hours. Coagulation Profile: No results for input(s): INR, PROTIME in the last 168 hours. Cardiac Enzymes: No results for input(s): CKTOTAL, CKMB, CKMBINDEX, TROPONINI in the last 168 hours. BNP (last 3 results) No results for input(s): PROBNP in the last 8760 hours. HbA1C: No results for input(s): HGBA1C in the last 72 hours. CBG: No results for input(s): GLUCAP in the last 168 hours. Lipid Profile: No results for input(s): CHOL, HDL, LDLCALC, TRIG, CHOLHDL, LDLDIRECT in the last 72 hours. Thyroid Function Tests: No results for input(s): TSH, T4TOTAL, FREET4, T3FREE, THYROIDAB in the last 72 hours. Anemia Panel: No results for input(s): VITAMINB12, FOLATE, FERRITIN, TIBC, IRON, RETICCTPCT in the last 72 hours. Urine analysis:    Component Value Date/Time   COLORURINE YELLOW 07/06/2014 1913   APPEARANCEUR CLEAR 07/06/2014 1913   LABSPEC 1.036 (H) 07/06/2014 1913   PHURINE 5.5 07/06/2014 1913   GLUCOSEU >1000 (A) 07/06/2014 1913   HGBUR NEGATIVE 07/06/2014 1913   BILIRUBINUR NEGATIVE 07/06/2014 1913   KETONESUR NEGATIVE 07/06/2014 1913   PROTEINUR NEGATIVE 07/06/2014 1913   UROBILINOGEN 1.0 07/06/2014 1913   NITRITE NEGATIVE 07/06/2014 1913   LEUKOCYTESUR NEGATIVE 07/06/2014 1913   Sepsis Labs: @LABRCNTIP (procalcitonin:4,lacticidven:4) )No results found for this or any previous visit (from the past 240 hour(s)).   Radiological Exams on  Admission: No results found.  EKG: Independently reviewed. Sinus rhythm, T-wave inversions, QTc 490.   Assessment/Plan   1. Acute pulmonary edema; acute hypoxic respiratory failure  - Presents with SOB and is found to have pulmonary edema with new supplemental O2 requirement suspected secondary to worsening renal function  - He has improved significantly with IV Lasix, reportedly had 1200 cc output at [facility], still requiring supplemental O2 at rest but not in any distress  - Repeat IV Lasix, continue supplemental O2 as needed    2. Acute kidney injury  superimposed on CKD V; hyperkalemia  - Patient has CKD V with dialysis access in LUE but not yet on HD, presenting to ED with increased SOB and found to have pulmonary edema and new O2 requirement in addition to increased SCr, mild hyperkalemia, mild acidosis, and normal BP  - Potassium normalized and SOB significantly improved with IV Lasix in ED  - Repeat chemistries, give another dose Lasix, monitor I/O and weights, renally-dose medications, and fluid-restrict    3. Elevated troponin  - Presents with SOB and is found to have pulmonary edema with new supplemental O2 requirement and elevated troponin without any chest discomfort  - EKG tracing from ED not visible but was interpreted as SR with LVH and T-wave abnormality  - Likely type II non-STEMI in setting of renal failure with pulm edema and acute hypoxic respiratory failure  - Check EKG now, continue cardiac monitoring, trend troponin to peak, check echocardiogram, and follow-up pharmacy medication-reconciliation and start/continue statin and antiplatelet therapy   4. Insulin-dependent DM  - A1c was 8.5% in January  - Pharmacy medication-reconciliation pending, will check CBGs and use a low-intensity SSI for now    5. Hypertension  - BP at goal   - Pharmacy medication-reconciliation pending, would hold ACE if still taking   6. History of CVA  - Plan to continue statin and antiplatelet  pending pharmacy medication-reconciliation     DVT prophylaxis: sq heparin  Code Status: Full  Family Communication: Significant other updated at bedside Disposition Plan:  Patient is from: Home  Anticipated d/c is to: TBD Anticipated d/c date is: 03/27/20 Patient currently: Has new supplemental O2 requirement, pending echocardiogram, and likely needs inpatient nephrology and possibly cardiology consultations  Consults called: None  Admission status: Inpatient     Vianne Bulls, MD [facility]  03/24/2020, 12:23 AM

## 2020-03-24 NOTE — Progress Notes (Addendum)
Patient PTT is > 200 MD Notify. Pharmacy notify. Pharmacy states to continue heparin gtt he thinks its because of the bolus. Will collect heparin level at 2200.

## 2020-03-24 NOTE — Progress Notes (Signed)
Patient has a critical level  Troponin 7090. MD notify.

## 2020-03-24 NOTE — Consult Note (Signed)
Cardiology Consultation:   Patient ID: William Schroeder MRN: [id number]; DOB: [birth year]  Admit date: 03/23/2020 Date of Consult: 03/24/2020  Primary Care Provider: [facility] Cardiologist: [facility] Electrophysiologist:  None    Patient Profile:   William Schroeder is a 59 y.o. male with a hx of CKD V, HTN, DM2, CVA who is being seen today for the evaluation of SOB at the request of Dr. Rodena Piety  History of Present Illness:   [name] 59 yo male history of DM2, CVA, HTN, CKD V admitted with SOB, orthopnea, abdominal distension. In [facility] ER hypoxic in the 80s, CXR evidence of pulm edema.   He reports gradual progression of SOB and abdominal distension, with a sudden severe worsening prior to admission. +orthopnea. Denies any chest pains.   [facility] ER data: proBNP 30,000, trop 1884   K 4.9 Cr 5.78 BUN 43 Mg 2.2 WBC 8.3 Hgb 9.4 Plt 186  hstrop 7090--> EKG SR, diffuse TWIs.     Past Medical History:  Diagnosis Date  . Arthritis   . Carpal tunnel syndrome 10/18/2014   Bilateral  . Chronic leg pain    since last back surgery  . DDD (degenerative disc disease), lumbar   . Depression   . Diabetes mellitus    Type II  . Gastroparesis   . GERD (gastroesophageal reflux disease)   . Headache   . Hyperlipidemia   . Hypertension   . Neuropathy   . Stroke [facility]) March , July 2017   right arm    Past Surgical History:  Procedure Laterality Date  . ANKLE FRACTURE SURGERY Left   . BACK SURGERY     two, last one 06/2011  . CATARACT EXTRACTION W/PHACO Right 01/28/2016   Procedure: CATARACT EXTRACTION PHACO AND INTRAOCULAR LENS PLACEMENT (IOC);  Surgeon: Tonny , MD;  Location: [facility];  Service: Ophthalmology;  Laterality: Right;  CDE: 6.18  . CHOLECYSTECTOMY    . COLONOSCOPY  04/2009   Dr. Hulda Humphrey diverticulum at ascending colon  . COLONOSCOPY WITH ESOPHAGOGASTRODUODENOSCOPY (EGD)  08/18/2012   Dr. Gala Romney: TCS with few  colonic diverticulosis, , EGD with short segment biopsy-proven Barrett's, gastric and bulbar erosions. Negative H.pylori. Low-grade dysplasia of Barrett's.   . ESOPHAGOGASTRODUODENOSCOPY  04/2009   Dr. Jearld Shines 8mg IV/Demerol 50mg IV. erosive reflux esophagitis with single patch of salmon colored mucosa at distal esophagus. bx-no definite Barrett's  . GAS/FLUID EXCHANGE Left 12/07/2018   Procedure: Gas/Fluid Exchange;  Surgeon: Jalene Mullet, MD;  Location: [place];  Service: Ophthalmology;  Laterality: Left;  . LUMBAR FUSION  12/16/2017  . MEMBRANE PEEL Left 12/07/2018   Procedure: Antoine Primas;  Surgeon: Jalene Mullet, MD;  Location: [place];  Service: Ophthalmology;  Laterality: Left;  . PHOTOCOAGULATION WITH LASER Left 12/07/2018   Procedure: Photocoagulation With Laser;  Surgeon: Jalene Mullet, MD;  Location: [place];  Service: Ophthalmology;  Laterality: Left;  . REPAIR OF COMPLEX TRACTION RETINAL DETACHMENT Left 12/07/2018   Procedure: REPAIR OF COMPLEX TRACTION RETINAL DETACHMENT with 25g VITRECTOMY;  Surgeon: Jalene Mullet, MD;  Location: [place];  Service: Ophthalmology;  Laterality: Left;  . STERIOD INJECTION  01/16/2012   Procedure: MINOR STEROID INJECTION;  Surgeon: Lowella Grip, MD;  Location: [place];  Service: Orthopedics;  Laterality: Right;  Epidural Steroid Injection  Right Lumbar 3-4 and Right Lumbar 5 - Sacral 1     Inpatient Medications: Scheduled Meds: . heparin  5,000 Units Subcutaneous Q8H  . insulin aspart  0-6 Units Subcutaneous Q4H  . sodium chloride flush  3 mL Intravenous Q12H   Continuous Infusions: . sodium chloride    . furosemide     PRN Meds: sodium chloride, acetaminophen, ondansetron (ZOFRAN) IV, sodium chloride flush  Allergies:   No Known Allergies  Social History:   Social History   Socioeconomic History  . Marital status: Legally Separated    Spouse name: Not on file  . Number of children: 2  . Years of  education: Not on file  . Highest education level: Not on file  Occupational History  . Occupation: saw Programmer, multimedia: [employer]  Tobacco Use  . Smoking status: Former Smoker    Packs/day: 1.00    Years: 20.00    Pack years: 20.00    Types: Cigarettes    Quit date: 12/31/1988    Years since quitting: 31.2  . Smokeless tobacco: Current User    Types: Snuff  . Tobacco comment: 12/06/2018 1 can of snuff a day  Vaping Use  . Vaping Use: Never used  Substance and Sexual Activity  . Alcohol use: No  . Drug use: No  . Sexual activity: Not Currently    Birth control/protection: None  Other Topics Concern  . Not on file  Social History Narrative  . Not on file   Social Determinants of Health   Financial Resource Strain:   . Difficulty of Paying Living Expenses:   Food Insecurity:   . Worried About Charity fundraiser in the Last Year:   . Arboriculturist in the Last Year:   Transportation Needs:   . Film/video editor (Medical):   Marland Kitchen Lack of Transportation (Non-Medical):   Physical Activity:   . Days of Exercise per Week:   . Minutes of Exercise per Session:   Stress:   . Feeling of Stress :   Social Connections:   . Frequency of Communication with Friends and Family:   . Frequency of Social Gatherings with Friends and Family:   . Attends Religious Services:   . Active Member of Clubs or Organizations:   . Attends Archivist Meetings:   Marland Kitchen Marital Status:   Intimate Partner Violence:   . Fear of Current or Ex-Partner:   . Emotionally Abused:   Marland Kitchen Physically Abused:   . Sexually Abused:     Family History:    Family History  Problem Relation Age of Onset  . Colon cancer Other        uncle  . Pancreatic cancer Sister   . Depression Sister   . Pancreatic cancer Brother   . Depression Brother   . Drug abuse Brother   . Diabetes Mother   . Heart disease Mother   . Kidney disease Mother   . Diabetes Other        9 siblings  .  Alcohol abuse Brother      ROS:  Please see the history of present illness.   All other ROS reviewed and negative.     Physical Exam/Data:   Vitals:   03/24/20 0101 03/24/20 0222 03/24/20 0358 03/24/20 0923  BP: (!) 152/70  (!) 150/70 (!) 155/73  Pulse: 76  74 70  Resp: 16  17 18   Temp: 98.9 F (37.2 C)  98.9 F (37.2 C) 97.7 F (36.5 C)  TempSrc: Oral  Oral Oral  SpO2: 98%  99% 98%  Weight:  100.8 kg    Height:  Intake/Output Summary (Last 24 hours) at 03/24/2020 1226 Last data filed at 03/24/2020 0900 Gross per 24 hour  Intake 182 ml  Output 1000 ml  Net -818 ml   Last 3 Weights 03/24/2020 03/23/2020 12/07/2018  Weight (lbs) 222 lb 3.6 oz 222 lb 3.6 oz 235 lb  Weight (kg) 100.8 kg 100.8 kg 106.595 kg  Some encounter information is confidential and restricted. Go to Review Flowsheets activity to see all data.     Body mass index is 31.89 kg/m.  General:  Well nourished, well developed, in no acute distress HEENT: normal Lymph: no adenopathy Neck: no JVD Endocrine:  No thryomegaly Vascular: No carotid bruits; FA pulses 2+ bilaterally without bruits  Cardiac:  normal S1, S2; RRR; no murmur  Lungs:  Crackles bilateral bases Abd: soft, nontender, no hepatomegaly  Ext: no edema Musculoskeletal:  No deformities, BUE and BLE strength normal and equal Skin: warm and dry  Neuro:  CNs 2-12 intact, no focal abnormalities noted Psych:  Normal affect     Laboratory Data:  High Sensitivity Troponin:   Recent Labs  Lab 03/24/20 0615  TROPONINIHS 7,090*     Chemistry Recent Labs  Lab 03/24/20 0615  NA 138  K 4.9  CL 111  CO2 18*  GLUCOSE 116*  BUN 43*  CREATININE 5.78*  CALCIUM 8.6*  GFRNONAA 10*  GFRAA 11*  ANIONGAP 9    No results for input(s): PROT, ALBUMIN, AST, ALT, ALKPHOS, BILITOT in the last 168 hours. Hematology Recent Labs  Lab 03/24/20 0615  WBC 8.3  RBC 3.11*  HGB 9.4*  HCT 29.7*  MCV 95.5  MCH 30.2  MCHC 31.6  RDW 13.5  PLT  186   BNPNo results for input(s): BNP, PROBNP in the last 168 hours.  DDimer No results for input(s): DDIMER in the last 168 hours.   Radiology/Studies:  No results found. {   Assessment and Plan:   1. NSTEMI - presents with significant troponin elevation, new diffuse TWIs compared to 01/2016 EKG. Presents with evidence of acute HF. Echo pending - no chest pain, gradual progression SOB with sudden worsneing prior to admission - medical therapy with ASA, atorva 80, hep gtt. Hold on beta blocker until more euvolemic and LVEF known - would plan for cath likely Monday pending renal evaluation if ok, patient is borderline HD and has been prepped as outpatient for this.   2. Acute heart failure, unknown type - pulm edema by CXR at [facility], significant BNP elevation.  - received lasix IV 120mg  with some improvement in breathing.  - remains on IV lasix 120mg  bid - no ACE/ARB/ARNI given renal function, if committed to HD could start - would not start beta blocker yet until more euvolemic   - with high bp's start hydral 25mg  tid, imdur 15mg  daily for now. I anticipate some systolic dysfunction, if not can convert to alternative antihypertensive regimen.   3. AKI on CKD V - per nephrology - had been prepping for possible HD as outpatient.  - would need clearance from nephrology this admit to pursue cath.     For questions or updates, please contact [place] Please consult [email or web address] for contact info under    Signed, Carlyle Dolly, MD  03/24/2020 12:26 PM

## 2020-03-24 NOTE — Progress Notes (Signed)
ANTICOAGULATION CONSULT NOTE - Initial Consult  Pharmacy Consult for heparin Indication: chest pain/ACS  No Known Allergies  Patient Measurements: Height: 5\' 10"  (177.8 cm) Weight: 100.8 kg (222 lb 3.6 oz) IBW/kg (Calculated) : 73 Heparin Dosing Weight: 94.1 kg  Vital Signs: Temp: 97.7 F (36.5 C) (07/17 0923) Temp Source: Oral (07/17 0923) BP: 155/73 (07/17 0923) Pulse Rate: 70 (07/17 0923)  Labs: Recent Labs    03/24/20 0615  HGB 9.4*  HCT 29.7*  PLT 186  CREATININE 5.78*  TROPONINIHS 7,090*    Estimated Creatinine Clearance: 16 mL/min (A) (by C-G formula based on SCr of 5.78 mg/dL (H)).   Medical History: Past Medical History:  Diagnosis Date  . Arthritis   . Carpal tunnel syndrome 10/18/2014   Bilateral  . Chronic leg pain    since last back surgery  . DDD (degenerative disc disease), lumbar   . Depression   . Diabetes mellitus    Type II  . Gastroparesis   . GERD (gastroesophageal reflux disease)   . Headache   . Hyperlipidemia   . Hypertension   . Neuropathy   . Stroke [facility]) March , July 2017   right arm    Medications:  Scheduled:  . aspirin EC  81 mg Oral Daily  . atorvastatin  80 mg Oral Daily  . heparin  5,000 Units Subcutaneous Q8H  . insulin aspart  0-6 Units Subcutaneous Q4H  . sodium chloride flush  3 mL Intravenous Q12H    Assessment: William Schroeder is a 59 YO male with history of T2DM, CVA, HTN, CKD stage V admitted for SOB, orthopnea, and abdominal distension.  He was transferred from [place] for hypoxia (O2 sat in 80's).    Will begin heparin for anticoagulation due to chest pain/ACS.  Patient will also be initiated on ASA81 and atorvastatin 80.  [facility] ER labs: proBNP 30,000; trop 1884 [facility] labs: trops 7090, Hgb 9.4, plt 186, Scr 5.78  Goal of Therapy:  Heparin level 0.3-0.7 units/ml Monitor platelets by anticoagulation protocol: Yes   Plan:  Give 4000 units bolus x 1 After bolus, initiate 1400 units/hr   Check baseline PTT, PT/INR Check 8 hour HL due to CrCl < 30 ml/min  Dimple Nanas, PharmD PGY-1 Acute Care Pharmacy Resident Office: [phone] 03/24/2020 1:00 PM

## 2020-03-24 NOTE — Progress Notes (Signed)
PROGRESS NOTE    [name]  HQP:[id number] DOB: [birth year] DOA: 03/23/2020 PCP: [facility]    Brief Narrative: HPI per Dr. Myna Hidalgo.  03/23/2020.  William Schroeder is a 59 y.o. male with medical history significant for insulin-dependent diabetes mellitus, history of CVA, hypertension, and chronic kidney disease stage V, now presenting to the emergency department for evaluation of shortness of breath.  Patient reports some chronic dyspnea and notes that he typically sleeps in a recliner because of shortness of breath and back pain when he tries to lay flat.  His shortness of breath was worse than usual on 03/22/2020, he tried to sleep been a bed that night, but developed severe worsening in his dyspnea and felt as though he cannot catch his breath even when he sat back up.  His ex-wife was with him, noted that he appeared pale and struggling to breathe, and EMS was called.  Patient denies any cough, denies fevers or chills, and has not experienced any chest discomfort or palpitations associated with this.  He denies any leg swelling but reports that his abdomen was swollen recently.  [facility] ED Course: Upon arrival to the ED, patient is found to be saturating in the mid 80s on room air, afebrile, and with stable blood pressure.  EKG featured a sinus rhythm with T wave abnormality.  Chest x-ray was concerning for pulmonary edema.  Chemistry panel was notable for creatinine of 5.37 and potassium 5.3.  proBNP was elevated to 30,000 and troponin elevated to 1884.  Nephrology and cardiology were consulted by the ED physician and admission to [facility] was recommended.  Patient was given 120 mg of IV Lasix in the ED with normalization and potassium, improvement in his oxygen requirement, subjective improvement in his shortness of breath, but his troponin continued to rise, reaching 6000 just prior to transfer.  On arrival to [facility], patient reports that his dyspnea is much improved, he  is able to lay back now, and continues to deny any chest discomfort.  03/24/2020 patient resting in bed on 5 L of oxygen.  He did not have oxygen prior to admission to hospital.  He feels his breathing is better after Lasix.  He lives at home with his daughter.  Apparently his daughter is also in the hospital. Denies any chest pain.  Assessment & Plan:   Principal Problem:   Hypervolemia associated with renal insufficiency Active Problems:   Diabetes mellitus, type 2 (HCC)   Hypertension   CKD (chronic kidney disease), stage V (HCC)   Acute respiratory failure with hypoxia (HCC)   Non-STEMI (non-ST elevated myocardial infarction) (HCC)   Hyperkalemia  #1 NSTEMI-patient presented with worsening shortness of breath dyspnea on exertion chest x-ray with pulmonary edema elevated BNP and increased elevated troponins. Continue Lasix per nephrology.  120 mg twice a day. Trend troponin 7090 Repeat EKG done today shows T wave inversions in multiple leads Continue aspirin statin and Lasix. Plan for cath Monday if cleared by nephrology.  Appreciate cardiology input. Heparin drip  #2 AKI on CKD stage V-creatinine trending up 5.78 today.  On Lasix twice daily 120 mg.  Negative by 800 cc.  Has been preparing for dialysis as an outpatient with AV fistula in place.  #3 history of type 2 diabetes-hemoglobin A1c of 7.2 CBG (last 3)  Recent Labs    03/24/20 0357 03/24/20 0730 03/24/20 1149  GLUCAP 111* 108* 108*   He was on Glucophage and insulin prior to admission.  Continue SSI for now.  #4 history of stroke on aspirin and statin.  #5 acute heart failure with pulmonary edema continue Lasix.  Follow-up echo.  #6 anemia of chronic renal disease likely monitor daily  #7 essential hypertension on Norvasc and lisinopril prior to admission.  #8 history of hyperlipidemia on Zocor   Estimated body mass index is 31.89 kg/m as calculated from the following:   Height as of this encounter: 5'  10" (1.778 m).   Weight as of this encounter: 100.8 kg.  DVT prophylaxis: Heparin Code Status: Full code Family Communication: None at bedside  disposition Plan:  Status is: Inpatient  Dispo: The patient is from: Home              Anticipated d/c is XB:MWUXLKG              Anticipated d/c date MW:NUUVOZD              Patient currently is not medically stable to d/c.  Patient admitted with pulmonary edema new onset with CKD AKI and heart failure and nstemi   Consultants:   Cardiology and nephrology  Procedures: None Antimicrobials: None  Subjective: Patient resting in bed he feels his breathing is better denies any chest pain  Objective: Vitals:   03/24/20 0101 03/24/20 0222 03/24/20 0358 03/24/20 0923  BP: (!) 152/70  (!) 150/70 (!) 155/73  Pulse: 76  74 70  Resp: 16  17 18   Temp: 98.9 F (37.2 C)  98.9 F (37.2 C) 97.7 F (36.5 C)  TempSrc: Oral  Oral Oral  SpO2: 98%  99% 98%  Weight:  100.8 kg    Height:        Intake/Output Summary (Last 24 hours) at 03/24/2020 1308 Last data filed at 03/24/2020 0900 Gross per 24 hour  Intake 182 ml  Output 1000 ml  Net -818 ml   Filed Weights   03/23/20 2230 03/24/20 0222  Weight: 100.8 kg 100.8 kg    Examination:  General exam: Appears calm and comfortable  Respiratory system: Crackles bilateral bases to auscultation. Respiratory effort normal. Cardiovascular system: S1 & S2 heard, RRR. No JVD, murmurs, rubs, gallops or clicks. No pedal edema. Gastrointestinal system: Abdomen is nondistended, soft and nontender. No organomegaly or masses felt. Normal bowel sounds heard. Central nervous system: Alert and oriented. No focal neurological deficits. Extremities: 1+ pitting edema bilateral  skin: No rashes, lesions or ulcers Psychiatry: Judgement and insight appear normal. Mood & affect appropriate.     Data Reviewed: I have personally reviewed following labs and imaging studies  CBC: Recent Labs  Lab 03/24/20 0615    WBC 8.3  NEUTROABS 4.5  HGB 9.4*  HCT 29.7*  MCV 95.5  PLT 664   Basic Metabolic Panel: Recent Labs  Lab 03/24/20 0615  NA 138  K 4.9  CL 111  CO2 18*  GLUCOSE 116*  BUN 43*  CREATININE 5.78*  CALCIUM 8.6*  MG 2.2   GFR: Estimated Creatinine Clearance: 16 mL/min (A) (by C-G formula based on SCr of 5.78 mg/dL (H)). Liver Function Tests: No results for input(s): AST, ALT, ALKPHOS, BILITOT, PROT, ALBUMIN in the last 168 hours. No results for input(s): LIPASE, AMYLASE in the last 168 hours. No results for input(s): AMMONIA in the last 168 hours. Coagulation Profile: No results for input(s): INR, PROTIME in the last 168 hours. Cardiac Enzymes: No results for input(s): CKTOTAL, CKMB, CKMBINDEX, TROPONINI in the last 168 hours. BNP (last 3 results) No results  for input(s): PROBNP in the last 8760 hours. HbA1C: Recent Labs    03/24/20 0615  HGBA1C 7.2*   CBG: Recent Labs  Lab 03/24/20 0357 03/24/20 0730 03/24/20 1149  GLUCAP 111* 108* 108*   Lipid Profile: No results for input(s): CHOL, HDL, LDLCALC, TRIG, CHOLHDL, LDLDIRECT in the last 72 hours. Thyroid Function Tests: No results for input(s): TSH, T4TOTAL, FREET4, T3FREE, THYROIDAB in the last 72 hours. Anemia Panel: No results for input(s): VITAMINB12, FOLATE, FERRITIN, TIBC, IRON, RETICCTPCT in the last 72 hours. Sepsis Labs: No results for input(s): PROCALCITON, LATICACIDVEN in the last 168 hours.  No results found for this or any previous visit (from the past 240 hour(s)).       Radiology Studies: No results found.      Scheduled Meds: . aspirin EC  81 mg Oral Daily  . atorvastatin  80 mg Oral Daily  . heparin  4,000 Units Intravenous Once  . hydrALAZINE  25 mg Oral TID  . insulin aspart  0-6 Units Subcutaneous Q4H  . isosorbide mononitrate  15 mg Oral Daily  . sodium chloride flush  3 mL Intravenous Q12H   Continuous Infusions: . sodium chloride    . furosemide    . heparin        LOS: 1 day     Georgette Shell, MD  03/24/2020, 1:08 PM

## 2020-03-25 ENCOUNTER — Inpatient Hospital Stay (HOSPITAL_COMMUNITY): Payer: Medicare Other

## 2020-03-25 DIAGNOSIS — E8770 Fluid overload, unspecified: Secondary | ICD-10-CM | POA: Diagnosis not present

## 2020-03-25 DIAGNOSIS — R0602 Shortness of breath: Secondary | ICD-10-CM

## 2020-03-25 DIAGNOSIS — N289 Disorder of kidney and ureter, unspecified: Secondary | ICD-10-CM | POA: Diagnosis not present

## 2020-03-25 DIAGNOSIS — I5021 Acute systolic (congestive) heart failure: Secondary | ICD-10-CM

## 2020-03-25 LAB — CBC
HCT: 30.6 % — ABNORMAL LOW (ref 39.0–52.0)
Hemoglobin: 9.7 g/dL — ABNORMAL LOW (ref 13.0–17.0)
MCH: 30.1 pg (ref 26.0–34.0)
MCHC: 31.7 g/dL (ref 30.0–36.0)
MCV: 95 fL (ref 80.0–100.0)
Platelets: 169 10*3/uL (ref 150–400)
RBC: 3.22 MIL/uL — ABNORMAL LOW (ref 4.22–5.81)
RDW: 13.3 % (ref 11.5–15.5)
WBC: 7.9 10*3/uL (ref 4.0–10.5)
nRBC: 0 % (ref 0.0–0.2)

## 2020-03-25 LAB — ECHOCARDIOGRAM COMPLETE
Area-P 1/2: 3.21 cm2
Height: 70 in
S' Lateral: 3.6 cm
Weight: 3555.58 oz

## 2020-03-25 LAB — BASIC METABOLIC PANEL
Anion gap: 13 (ref 5–15)
BUN: 44 mg/dL — ABNORMAL HIGH (ref 8–23)
CO2: 17 mmol/L — ABNORMAL LOW (ref 22–32)
Calcium: 8.7 mg/dL — ABNORMAL LOW (ref 8.9–10.3)
Chloride: 109 mmol/L (ref 98–111)
Creatinine, Ser: 5.9 mg/dL — ABNORMAL HIGH (ref 0.61–1.24)
GFR calc Af Amer: 11 mL/min — ABNORMAL LOW (ref >60)
GFR calc non Af Amer: 9 mL/min — ABNORMAL LOW (ref >60)
Glucose, Bld: 125 mg/dL — ABNORMAL HIGH (ref 70–99)
Potassium: 4.5 mmol/L (ref 3.5–5.1)
Sodium: 139 mmol/L (ref 135–145)

## 2020-03-25 LAB — GLUCOSE, CAPILLARY
Glucose-Capillary: 118 mg/dL — ABNORMAL HIGH (ref 70–99)
Glucose-Capillary: 193 mg/dL — ABNORMAL HIGH (ref 70–99)
Glucose-Capillary: 145 mg/dL — ABNORMAL HIGH (ref 70–99)
Glucose-Capillary: 172 mg/dL — ABNORMAL HIGH (ref 70–99)

## 2020-03-25 LAB — HEPARIN LEVEL (UNFRACTIONATED): Heparin Unfractionated: 0.59 IU/mL (ref 0.30–0.70)

## 2020-03-25 MED ORDER — SODIUM CHLORIDE 0.9% FLUSH: 3.0000 mL | INTRAVENOUS | Status: DC | PRN

## 2020-03-25 MED ORDER — ASPIRIN 81 MG PO CHEW
81.0000 mg | CHEWABLE_TABLET | ORAL | Status: AC
  Administered 2020-03-26: 81 mg via ORAL
  Filled 2020-03-25: qty 1

## 2020-03-25 MED ORDER — SODIUM CHLORIDE 0.9 % IV SOLN: 250.0000 mL | INTRAVENOUS | Status: DC | PRN

## 2020-03-25 MED ORDER — PERFLUTREN LIPID MICROSPHERE
1.0000 mL | INTRAVENOUS | Status: AC | PRN
  Administered 2020-03-25: 3 mL via INTRAVENOUS
  Filled 2020-03-25: qty 10

## 2020-03-25 MED ORDER — FUROSEMIDE 10 MG/ML IJ SOLN: 120.0000 mg | Freq: Two times a day (BID) | INTRAVENOUS | Status: DC

## 2020-03-25 MED ORDER — SODIUM CHLORIDE 0.9% FLUSH
3.0000 mL | Freq: Two times a day (BID) | INTRAVENOUS | Status: DC
  Administered 2020-03-27: 3 mL via INTRAVENOUS

## 2020-03-25 MED ORDER — SODIUM CHLORIDE 0.9 % IV SOLN: INTRAVENOUS | Status: DC

## 2020-03-25 NOTE — Progress Notes (Signed)
Admit: 03/23/2020 LOS: 2  59M progresive CKD5 with NSTEMI  Subjective:  . Cardiology following TTE this AM . Troponin quite elevated, some EKG changes as well . Diuresed well  2.6L overnight, neg 1.9L.  SOB stable / improved . No sig CP  07/17 0701 - 07/18 0700 In: 621.2 [P.O.:580; I.V.:41.2] Out: 2550 [Urine:2550]  Filed Weights   03/23/20 2230 03/24/20 0222 03/25/20 0500  Weight: 100.8 kg 100.8 kg 100.8 kg    Scheduled Meds: . aspirin EC  81 mg Oral Daily  . atorvastatin  80 mg Oral Daily  . hydrALAZINE  25 mg Oral TID  . insulin aspart  0-6 Units Subcutaneous TID WC  . isosorbide mononitrate  15 mg Oral Daily  . nicotine  14 mg Transdermal Daily  . sodium chloride flush  3 mL Intravenous Q12H   Continuous Infusions: . sodium chloride    . furosemide 120 mg (03/25/20 1017)  . heparin 1,400 Units/hr (03/25/20 0617)   PRN Meds:.sodium chloride, acetaminophen, ondansetron (ZOFRAN) IV, perflutren lipid microspheres (DEFINITY) IV suspension, sodium chloride flush  Current Labs: reviewed  Results for William, Schroeder (MRN [id number]) as of 03/25/2020 10:20  Ref. Range 03/24/2020 06:15  Saturation Ratios Latest Ref Range: 17.9 - 39.5 % 36  Ferritin Latest Ref Range: 24 - 336 ng/mL 26    Physical Exam:  Blood pressure (!) 143/72, pulse 75, temperature 97.7 F (36.5 C), temperature source Oral, resp. rate 18, height 5\' 10"  (1.778 m), weight 100.8 kg, SpO2 98 %. GEN: NAD. Lying flat in bed, nl wob ENT: NCAT EYES: EOMI CV: RRR no rub PULM: Diminished and bronchial in bases, clear elsewhere ABD: s/nt/nd SKIN: no rashes/lesions; LUE AVF incision well healed EXT:no sig LEE LUE AVF +B/T  A 1. CKD5 followed by [facility] / [facility];  Appears is progressive, SCr 4.6 last May.  SOB / Hypoxia / Pulm Edema, imprvoed but not resolved 2. NSTEMI 3. LUE AVF +B/T, healing well 4. DM2 5. HTN, not sure home regimen, stable now 6. 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 D/w  cardiology, if high risk features on NSTEMI would prob benefit from [facility] and have discussed possibility that this might lead to ESRD and dialysis dependence.   . If LHC pursued, hold further diuresis for 24h prior . Pt aware of competing issues of NSTEMI and advancing CKD5 . Will see what TTE shows . Daily weights, Daily Renal Panel, Strict I/Os, Avoid nephrotoxins (NSAIDs, judicious IV Contrast)     Pearson Grippe MD 03/25/2020, 10:26 AM  Recent Labs  Lab 03/24/20 0615 03/24/20 1226 03/25/20 0451  NA 138  --  139  K 4.9  --  4.5  CL 111  --  109  CO2 18*  --  17*  GLUCOSE 116*  --  125*  BUN 43*  --  44*  CREATININE 5.78*  --  5.90*  CALCIUM 8.6*  --  8.7*  PHOS  --  5.7*  --    Recent Labs  Lab 03/24/20 0615 03/25/20 0451  WBC 8.3 7.9  NEUTROABS 4.5  --   HGB 9.4* 9.7*  HCT 29.7* 30.6*  MCV 95.5 95.0  PLT 186 169

## 2020-03-25 NOTE — Progress Notes (Signed)
Patient's daughter and ex-wife are at the bedside.  Educated on Cardiac Catheterization procedure.  Ordered Video not operating at this time.  All questions answered.  Earleen Reaper RN

## 2020-03-25 NOTE — Plan of Care (Signed)
°  Problem: Coping: °Goal: Level of anxiety will decrease °Outcome: Progressing °  °

## 2020-03-25 NOTE — Progress Notes (Signed)
  Echocardiogram 2D Echocardiogram has been performed.  William Schroeder 03/25/2020, 9:51 AM

## 2020-03-25 NOTE — Progress Notes (Signed)
PROGRESS NOTE    CLARANCE Schroeder  DQQ:[id number] DOB: 03-Feb-1961 DOA: 03/23/2020 PCP: [facility]    Brief Narrative: HPI per Dr. Myna Schroeder.  03/23/2020.  William Schroeder is a 59 y.o. male with medical history significant for insulin-dependent diabetes mellitus, history of CVA, hypertension, and chronic kidney disease stage V, now presenting to the emergency department for evaluation of shortness of breath.  Patient reports some chronic dyspnea and notes that he typically sleeps in a recliner because of shortness of breath and back pain when he tries to lay flat.  His shortness of breath was worse than usual on 03/22/2020, he tried to sleep been a bed that night, but developed severe worsening in his dyspnea and felt as though he cannot catch his breath even when he sat back up.  His ex-wife was with him, noted that he appeared pale and struggling to breathe, and EMS was called.  Patient denies any cough, denies fevers or chills, and has not experienced any chest discomfort or palpitations associated with this.  He denies any leg swelling but reports that his abdomen was swollen recently.  [facility] ED Course: Upon arrival to the ED, patient is found to be saturating in the mid 80s on room air, afebrile, and with stable blood pressure.  EKG featured a sinus rhythm with T wave abnormality.  Chest x-ray was concerning for pulmonary edema.  Chemistry panel was notable for creatinine of 5.37 and potassium 5.3.  proBNP was elevated to 30,000 and troponin elevated to 1884.  Nephrology and cardiology were consulted by the ED physician and admission to [facility] was recommended.  Patient was given 120 mg of IV Lasix in the ED with normalization and potassium, improvement in his oxygen requirement, subjective improvement in his shortness of breath, but his troponin continued to rise, reaching 6000 just prior to transfer.  On arrival to [facility], patient reports that his dyspnea is much improved, he  is able to lay back now, and continues to deny any chest discomfort.  03/24/2020 patient resting in bed on 5 L of oxygen.  He did not have oxygen prior to admission to hospital.  He feels his breathing is better after Lasix.  He lives at home with his daughter.  Apparently his daughter is also in the hospital. Denies any chest pain.  Assessment & Plan:   Principal Problem:   Hypervolemia associated with renal insufficiency Active Problems:   Diabetes mellitus, type 2 (HCC)   Hypertension   CKD (chronic kidney disease), stage V (HCC)   Acute respiratory failure with hypoxia (HCC)   Non-STEMI (non-ST elevated myocardial infarction) (HCC)   Hyperkalemia  #1 NSTEMI-patient presented with worsening shortness of breath dyspnea on exertion chest x-ray with pulmonary edema elevated BNP and increased elevated troponins. Continue Lasix per nephrology.  120 mg twice a day. Trend troponin 7090 Repeat EKG done today shows T wave inversions in multiple leads Continue aspirin statin and Lasix. Plan for cath Monday if cleared by nephrology.  Appreciate cardiology input. Heparin drip Nephrology recommends to hold Lasix for 24 hours prior to cath  #2 AKI on CKD stage V-creatinine trending up 5.78 today.  On Lasix twice daily 120 mg.  Negative by 2.4 L since admission.    Has been preparing for dialysis as an outpatient with AV fistula in place.  #3 history of type 2 diabetes-hemoglobin A1c of 7.2 CBG (last 3)  Recent Labs    03/24/20 2045 03/25/20 0645 03/25/20 1113  GLUCAP 143* 118* 193*    He was on Glucophage and insulin prior to admission. Continue SSI for now.  #4 history of stroke on aspirin and statin.  #5 acute heart failure with pulmonary edema continue Lasix.  Echocardiogram 03/24/2020 ejection fraction 45% anteroseptal wall is hypokinetic entire apex is hypokinetic left ventricle demonstrates regional wall motion abnormalities mild LVH.  Right ventricular systolic function  normal. Cardiology to start Toprol.  #6 anemia of chronic renal disease likely monitor daily  #7 essential hypertension on Norvasc and lisinopril prior to admission.  On hydralazine and Imdur.  #8 history of hyperlipidemia on Zocor  #9 tobacco abuse continue nicotine patch  Estimated body mass index is 31.89 kg/m as calculated from the following:   Height as of this encounter: 5\' 10"  (1.778 m).   Weight as of this encounter: 100.8 kg.  DVT prophylaxis: Heparin Code Status: Full code Family Communication: None at bedside  disposition Plan:  Status is: Inpatient  Dispo: The patient is from: Home              Anticipated d/c is XB:[id number]              Anticipated d/c date HR:CBULAGT              Patient currently is not medically stable to d/c.  Patient admitted with pulmonary edema new onset with CKD AKI and heart failure and nstemi   Consultants:   Cardiology and nephrology  Procedures: None Antimicrobials: None  Subjective: Patient resting in bed he feels his breathing is better denies any chest pain  Objective: Vitals:   03/24/20 2044 03/25/20 0500 03/25/20 0513 03/25/20 0952  BP: (!) 155/69  137/67 (!) 143/72  Pulse: 76  66 75  Resp: 18  18 18   Temp: 98.6 F (37 C)  98.4 F (36.9 C) 97.7 F (36.5 C)  TempSrc: Oral   Oral  SpO2: 96%  98% 98%  Weight:  100.8 kg    Height:        Intake/Output Summary (Last 24 hours) at 03/25/2020 1120 Last data filed at 03/25/2020 0900 Gross per 24 hour  Intake 921.24 ml  Output 2600 ml  Net -1678.76 ml   Filed Weights   03/23/20 2230 03/24/20 0222 03/25/20 0500  Weight: 100.8 kg 100.8 kg 100.8 kg    Examination:  General exam: Appears calm and comfortable  Respiratory system: Crackles bilateral bases to auscultation. Respiratory effort normal. Cardiovascular system: S1 & S2 heard, RRR. No JVD, murmurs, rubs, gallops or clicks. No pedal edema. Gastrointestinal system: Abdomen is nondistended, soft and nontender. No  organomegaly or masses felt. Normal bowel sounds heard. Central nervous system: Alert and oriented. No focal neurological deficits. Extremities: 1+ pitting edema bilateral  skin: No rashes, lesions or ulcers Psychiatry: Judgement and insight appear normal. Mood & affect appropriate.     Data Reviewed: I have personally reviewed following labs and imaging studies  CBC: Recent Labs  Lab 03/24/20 0615 03/25/20 0451  WBC 8.3 7.9  NEUTROABS 4.5  --   HGB 9.4* 9.7*  HCT 29.7* 30.6*  MCV 95.5 95.0  PLT 186 364   Basic Metabolic Panel: Recent Labs  Lab 03/24/20 0615 03/24/20 1226 03/25/20 0451  NA 138  --  139  K 4.9  --  4.5  CL 111  --  109  CO2 18*  --  17*  GLUCOSE 116*  --  125*  BUN 43*  --  44*  CREATININE 5.78*  --  5.90*  CALCIUM 8.6*  --  8.7*  MG 2.2  --   --   PHOS  --  5.7*  --    GFR: Estimated Creatinine Clearance: 15.6 mL/min (A) (by C-G formula based on SCr of 5.9 mg/dL (H)). Liver Function Tests: No results for input(s): AST, ALT, ALKPHOS, BILITOT, PROT, ALBUMIN in the last 168 hours. No results for input(s): LIPASE, AMYLASE in the last 168 hours. No results for input(s): AMMONIA in the last 168 hours. Coagulation Profile: Recent Labs  Lab 03/24/20 1523  INR 1.4*   Cardiac Enzymes: No results for input(s): CKTOTAL, CKMB, CKMBINDEX, TROPONINI in the last 168 hours. BNP (last 3 results) No results for input(s): PROBNP in the last 8760 hours. HbA1C: Recent Labs    03/24/20 0615  HGBA1C 7.2*   CBG: Recent Labs  Lab 03/24/20 1149 03/24/20 1625 03/24/20 2045 03/25/20 0645 03/25/20 1113  GLUCAP 108* 170* 143* 118* 193*   Lipid Profile: No results for input(s): CHOL, HDL, LDLCALC, TRIG, CHOLHDL, LDLDIRECT in the last 72 hours. Thyroid Function Tests: No results for input(s): TSH, T4TOTAL, FREET4, T3FREE, THYROIDAB in the last 72 hours. Anemia Panel: Recent Labs    03/24/20 0615  FERRITIN 26  TIBC 462*  IRON 167   Sepsis Labs: No  results for input(s): PROCALCITON, LATICACIDVEN in the last 168 hours.  No results found for this or any previous visit (from the past 240 hour(s)).       Radiology Studies: ECHOCARDIOGRAM COMPLETE  Result Date: 03/25/2020    ECHOCARDIOGRAM REPORT   Patient Name:   ALLARD LIGHTSEY Date of Exam: 03/25/2020 Medical Rec #:  [id number]      Height:       70.0 in Accession #:    [id number]     Weight:       222.2 lb Date of Birth:  Nov 18, 1960      BSA:          2.183 m Patient Age:    57 years       BP:           137/67 mmHg Patient Gender: M              HR:           66 bpm. Exam Location:  Inpatient Procedure: 2D Echo Indications:    elevated troponin  History:        Patient has prior history of Echocardiogram examinations, most                 recent 04/12/2013. Stroke; Risk Factors:Hypertension, Diabetes,                 Dyslipidemia and Former Smoker.  Sonographer:    Jannett Celestine RDCS (AE) Referring Phys: [id number] [place]  1. The anteroseptal wall is hypokinetic. The entire apex is hypokinetic. Marland Kitchen Left ventricular ejection fraction, by estimation, is 45%. The left ventricle has mildly decreased function. The left ventricle demonstrates regional wall motion abnormalities (see scoring diagram/findings for description). There is mild left ventricular hypertrophy. Left ventricular diastolic parameters are indeterminate.  2. Right ventricular systolic function is normal. The right ventricular size is normal.  3. The mitral valve is normal in structure. No evidence of mitral valve regurgitation. No evidence of mitral stenosis.  4. The aortic valve is tricuspid. Aortic valve regurgitation is not visualized. No aortic stenosis is present. FINDINGS  Left Ventricle: The anteroseptal wall is hypokinetic. The entire apex is hypokinetic.  Left ventricular ejection fraction, by estimation, is 45%. The left ventricle has mildly decreased function. The left ventricle demonstrates regional wall motion  abnormalities. The left ventricular internal cavity size was normal in size. There is mild left ventricular hypertrophy. Left ventricular diastolic parameters are indeterminate. Right Ventricle: The right ventricular size is normal. No increase in right ventricular wall thickness. Right ventricular systolic function is normal. Left Atrium: Left atrial size was normal in size. Right Atrium: Right atrial size was normal in size. Pericardium: There is no evidence of pericardial effusion. Mitral Valve: The mitral valve is normal in structure. No evidence of mitral valve regurgitation. No evidence of mitral valve stenosis. Tricuspid Valve: The tricuspid valve is not well visualized. Tricuspid valve regurgitation is trivial. No evidence of tricuspid stenosis. Aortic Valve: The aortic valve is tricuspid. Aortic valve regurgitation is not visualized. No aortic stenosis is present. Pulmonic Valve: The pulmonic valve was not well visualized. Pulmonic valve regurgitation is not visualized. No evidence of pulmonic stenosis. Aorta: The aortic root is normal in size and structure. Pulmonary Artery: Indeterminant PASP, inadequate TR jet. Venous: The inferior vena cava was not well visualized. IAS/Shunts: The interatrial septum was not well visualized.  LEFT VENTRICLE PLAX 2D LVIDd:         5.30 cm  Diastology LVIDs:         3.60 cm  LV e' lateral:   7.94 cm/s LV PW:         1.20 cm  LV E/e' lateral: 9.1 LV IVS:        1.10 cm  LV e' medial:    6.42 cm/s LVOT diam:     2.20 cm  LV E/e' medial:  11.2 LV SV:         75 LV SV Index:   34 LVOT Area:     3.80 cm  LEFT ATRIUM             Index       RIGHT ATRIUM           Index LA diam:        4.60 cm 2.11 cm/m  RA Area:     18.10 cm LA Vol (A2C):   61.5 ml 28.17 ml/m RA Volume:   43.90 ml  20.11 ml/m LA Vol (A4C):   58.2 ml 26.66 ml/m LA Biplane Vol: 59.7 ml 27.35 ml/m  AORTIC VALVE LVOT Vmax:   93.10 cm/s LVOT Vmean:  73.000 cm/s LVOT VTI:    0.197 m  AORTA Ao Root diam: 3.20 cm  MITRAL VALVE MV Area (PHT): 3.21 cm    SHUNTS MV Decel Time: 236 msec    Systemic VTI:  0.20 m MV E velocity: 72.00 cm/s  Systemic Diam: 2.20 cm MV A velocity: 78.80 cm/s MV E/A ratio:  0.91 Carlyle Dolly MD Electronically signed by Carlyle Dolly MD Signature Date/Time: 03/25/2020/10:18:33 AM    Final         Scheduled Meds: . aspirin EC  81 mg Oral Daily  . atorvastatin  80 mg Oral Daily  . hydrALAZINE  25 mg Oral TID  . insulin aspart  0-6 Units Subcutaneous TID WC  . isosorbide mononitrate  15 mg Oral Daily  . nicotine  14 mg Transdermal Daily  . sodium chloride flush  3 mL Intravenous Q12H   Continuous Infusions: . sodium chloride    . furosemide 120 mg (03/25/20 1017)  . heparin 1,400 Units/hr (03/25/20 0617)     LOS: 2 days  Georgette Shell, MD  03/25/2020, 11:20 AM

## 2020-03-25 NOTE — Progress Notes (Addendum)
Progress Note  Patient Name: WILMORE HOLSOMBACK Date of Encounter: 03/25/2020  [facility] Cardiologist: New  Subjective   No complaints  Inpatient Medications    Scheduled Meds: . aspirin EC  81 mg Oral Daily  . atorvastatin  80 mg Oral Daily  . hydrALAZINE  25 mg Oral TID  . insulin aspart  0-6 Units Subcutaneous TID WC  . isosorbide mononitrate  15 mg Oral Daily  . nicotine  14 mg Transdermal Daily  . sodium chloride flush  3 mL Intravenous Q12H   Continuous Infusions: . sodium chloride    . furosemide 120 mg (03/24/20 1700)  . heparin 1,400 Units/hr (03/25/20 0617)   PRN Meds: sodium chloride, acetaminophen, ondansetron (ZOFRAN) IV, perflutren lipid microspheres (DEFINITY) IV suspension, sodium chloride flush   Vital Signs    Vitals:   03/24/20 2044 03/25/20 0500 03/25/20 0513 03/25/20 0952  BP: (!) 155/69  137/67 (!) 143/72  Pulse: 76  66 75  Resp: 18  18 18   Temp: 98.6 F (37 C)  98.4 F (36.9 C) 97.7 F (36.5 C)  TempSrc: Oral   Oral  SpO2: 96%  98% 98%  Weight:  100.8 kg    Height:        Intake/Output Summary (Last 24 hours) at 03/25/2020 1004 Last data filed at 03/25/2020 0558 Gross per 24 hour  Intake 621.24 ml  Output 2200 ml  Net -1578.76 ml   Last 3 Weights 03/25/2020 03/24/2020 03/23/2020  Weight (lbs) 222 lb 3.6 oz 222 lb 3.6 oz 222 lb 3.6 oz  Weight (kg) 100.8 kg 100.8 kg 100.8 kg  Some encounter information is confidential and restricted. Go to Review Flowsheets activity to see all data.      Telemetry    SR - Personally Reviewed  ECG    n/a - Personally Reviewed  Physical Exam   GEN: No acute distress.   Neck: No JVD Cardiac: RRR, no murmurs, rubs, or gallops.  Respiratory: Clear to auscultation bilaterally. GI: Soft, nontender, non-distended  MS: No edema; No deformity. Neuro:  Nonfocal  Psych: Normal affect   Labs    High Sensitivity Troponin:   Recent Labs  Lab 03/24/20 0615 03/24/20 1226  TROPONINIHS 7,090*  7,421*      Chemistry Recent Labs  Lab 03/24/20 0615 03/25/20 0451  NA 138 139  K 4.9 4.5  CL 111 109  CO2 18* 17*  GLUCOSE 116* 125*  BUN 43* 44*  CREATININE 5.78* 5.90*  CALCIUM 8.6* 8.7*  GFRNONAA 10* 9*  GFRAA 11* 11*  ANIONGAP 9 13     Hematology Recent Labs  Lab 03/24/20 0615 03/25/20 0451  WBC 8.3 7.9  RBC 3.11* 3.22*  HGB 9.4* 9.7*  HCT 29.7* 30.6*  MCV 95.5 95.0  MCH 30.2 30.1  MCHC 31.6 31.7  RDW 13.5 13.3  PLT 186 169    BNPNo results for input(s): BNP, PROBNP in the last 168 hours.   DDimer No results for input(s): DDIMER in the last 168 hours.   Radiology    No results found.  Cardiac Studies    Patient Profile     SAMIER JACO is a 59 y.o. male with a hx of CKD V, HTN, DM2, CVA who is being seen today for the evaluation of SOB at the request of Dr. Rodena Piety  Assessment & Plan    1. NSTEMI presents with significant troponin elevation, new diffuse TWIs compared to 01/2016 EKG. Presents with evidence of acute HF.  -  no chest pain, gradual progression SOB with sudden worsneing prior to admission - medical therapy with ASA, atorva 80, hep gtt.  - echo LVEF 45%, mid to distal anteroseptal and apex are hypokinetic  - discussed with patient indications for cath, particularly given his significant CKD and risk for contrast nephropathy. Discussed with nephrology. Eventual need for HD is inevitable for patient, contrast may warrant earlier HD. His fistual is not yet matured, would require temp access if indicated - patient understands implications from a kidney standpoint and wishes to proceed with cath. Will arrange LHC/RHC tomorrow  - of note has been on ASA/plavix at home I presume for his prior stroke. Appears plavix held on admit, reasonable to continue to hold until after cath in case he has surgical anatomy.      2. Acute heart failure, systolic HF - pulm edema by CXR at [facility], significant BNP elevation.  - remains on IV lasix 120mg   bid - no ACE/ARB/ARNI given renal function, if committed to HD could start - would not start beta blocker yet until more euvolemic  - yesterday started hydral 25mg  tid, imdur 15 mg - more euvolemic today, start toprol 25mg  daily  Negative 2 L yeseterday, neg 2.3 L since admission. On lasix 120mg  bid, slight uptrend in Cr. Defer diuretic dosing to nephrology given advanced renal dysfunction     3. AKI on CKD V - per nephrology - had been prepping for possible HD as outpatient.    For questions or updates, please contact [place] Please consult [email or web address] for contact info under        Signed, Carlyle Dolly, MD  03/25/2020, 10:04 AM

## 2020-03-25 NOTE — Progress Notes (Signed)
[place] for heparin Indication: chest pain/ACS  No Known Allergies  Patient Measurements: Height: 5\' 10"  (177.8 cm) Weight: 100.8 kg (222 lb 3.6 oz) IBW/kg (Calculated) : 73 Heparin Dosing Weight: 94.1 kg  Vital Signs: Temp: 98.4 F (36.9 C) (07/18 0513) BP: 137/67 (07/18 0513) Pulse Rate: 66 (07/18 0513)  Labs: Recent Labs    03/24/20 0615 03/24/20 1226 03/24/20 1523 03/24/20 2152 03/25/20 0451  HGB 9.4*  --   --   --  9.7*  HCT 29.7*  --   --   --  30.6*  PLT 186  --   --   --  169  APTT  --   --  >200*  --   --   LABPROT  --   --  16.4*  --   --   INR  --   --  1.4*  --   --   HEPARINUNFRC  --   --   --  0.51 0.59  CREATININE 5.78*  --   --   --  5.90*  TROPONINIHS 7,090* 7,421*  --   --   --     Estimated Creatinine Clearance: 15.6 mL/min (A) (by C-G formula based on SCr of 5.9 mg/dL (H)).   Medical History: Past Medical History:  Diagnosis Date  . Arthritis   . Carpal tunnel syndrome 10/18/2014   Bilateral  . Chronic leg pain    since last back surgery  . DDD (degenerative disc disease), lumbar   . Depression   . Diabetes mellitus    Type II  . Gastroparesis   . GERD (gastroesophageal reflux disease)   . Headache   . Hyperlipidemia   . Hypertension   . Neuropathy   . Stroke [facility]) March , July 2017   right arm    Medications:  Scheduled:  . aspirin EC  81 mg Oral Daily  . atorvastatin  80 mg Oral Daily  . hydrALAZINE  25 mg Oral TID  . insulin aspart  0-6 Units Subcutaneous TID WC  . isosorbide mononitrate  15 mg Oral Daily  . nicotine  14 mg Transdermal Daily  . sodium chloride flush  3 mL Intravenous Q12H    Assessment: William Schroeder is a 59 YO male with history of T2DM, CVA, HTN, CKD stage V admitted for SOB, orthopnea, and abdominal distension.  He was transferred from [place] for hypoxia (O2 sat in 80's).    Will begin heparin for anticoagulation due to chest pain/ACS.  Patient will  also be initiated on ASA81 and atorvastatin 80.  [facility] ER labs: proBNP 30,000; trop 1884 MC labs: trops 7090, Hgb 9.4, plt 186, Scr 5.78  INR 1.4, PT 16.4, aPTT > 200 (after heparin bolus given)  2 consecutive heparin levels therapeutic, will proceed with daily heparin levels. Will continue heparin until plan for cath decided.   Goal of Therapy:  Heparin level 0.3-0.7 units/ml Monitor platelets by anticoagulation protocol: Yes   Plan:  Cont heparin at 1400 units/hr Daily heparin level with AM labs Monitor daily CBC  Dimple Nanas, PharmD PGY-1 Acute Care Pharmacy Resident Office: [phone] 03/25/2020 9:33 AM

## 2020-03-25 NOTE — H&P (View-Only) (Signed)
Progress Note  Patient Name: William Schroeder Date of Encounter: 03/25/2020  [facility] Cardiologist: New  Subjective   No complaints  Inpatient Medications    Scheduled Meds: . aspirin EC  81 mg Oral Daily  . atorvastatin  80 mg Oral Daily  . hydrALAZINE  25 mg Oral TID  . insulin aspart  0-6 Units Subcutaneous TID WC  . isosorbide mononitrate  15 mg Oral Daily  . nicotine  14 mg Transdermal Daily  . sodium chloride flush  3 mL Intravenous Q12H   Continuous Infusions: . sodium chloride    . furosemide 120 mg (03/24/20 1700)  . heparin 1,400 Units/hr (03/25/20 0617)   PRN Meds: sodium chloride, acetaminophen, ondansetron (ZOFRAN) IV, perflutren lipid microspheres (DEFINITY) IV suspension, sodium chloride flush   Vital Signs    Vitals:   03/24/20 2044 03/25/20 0500 03/25/20 0513 03/25/20 0952  BP: (!) 155/69  137/67 (!) 143/72  Pulse: 76  66 75  Resp: 18  18 18   Temp: 98.6 F (37 C)  98.4 F (36.9 C) 97.7 F (36.5 C)  TempSrc: Oral   Oral  SpO2: 96%  98% 98%  Weight:  100.8 kg    Height:        Intake/Output Summary (Last 24 hours) at 03/25/2020 1004 Last data filed at 03/25/2020 0558 Gross per 24 hour  Intake 621.24 ml  Output 2200 ml  Net -1578.76 ml   Last 3 Weights 03/25/2020 03/24/2020 03/23/2020  Weight (lbs) 222 lb 3.6 oz 222 lb 3.6 oz 222 lb 3.6 oz  Weight (kg) 100.8 kg 100.8 kg 100.8 kg  Some encounter information is confidential and restricted. Go to Review Flowsheets activity to see all data.      Telemetry    SR - Personally Reviewed  ECG    n/a - Personally Reviewed  Physical Exam   GEN: No acute distress.   Neck: No JVD Cardiac: RRR, no murmurs, rubs, or gallops.  Respiratory: Clear to auscultation bilaterally. GI: Soft, nontender, non-distended  MS: No edema; No deformity. Neuro:  Nonfocal  Psych: Normal affect   Labs    High Sensitivity Troponin:   Recent Labs  Lab 03/24/20 0615 03/24/20 1226  TROPONINIHS 7,090*  7,421*      Chemistry Recent Labs  Lab 03/24/20 0615 03/25/20 0451  NA 138 139  K 4.9 4.5  CL 111 109  CO2 18* 17*  GLUCOSE 116* 125*  BUN 43* 44*  CREATININE 5.78* 5.90*  CALCIUM 8.6* 8.7*  GFRNONAA 10* 9*  GFRAA 11* 11*  ANIONGAP 9 13     Hematology Recent Labs  Lab 03/24/20 0615 03/25/20 0451  WBC 8.3 7.9  RBC 3.11* 3.22*  HGB 9.4* 9.7*  HCT 29.7* 30.6*  MCV 95.5 95.0  MCH 30.2 30.1  MCHC 31.6 31.7  RDW 13.5 13.3  PLT 186 169    BNPNo results for input(s): BNP, PROBNP in the last 168 hours.   DDimer No results for input(s): DDIMER in the last 168 hours.   Radiology    No results found.  Cardiac Studies    Patient Profile     William Schroeder is a 59 y.o. male with a hx of CKD V, HTN, DM2, CVA who is being seen today for the evaluation of SOB at the request of Dr. Rodena Piety  Assessment & Plan    1. NSTEMI presents with significant troponin elevation, new diffuse TWIs compared to 01/2016 EKG. Presents with evidence of acute HF.  -  no chest pain, gradual progression SOB with sudden worsneing prior to admission - medical therapy with ASA, atorva 80, hep gtt.  - echo LVEF 45%, mid to distal anteroseptal and apex are hypokinetic  - discussed with patient indications for cath, particularly given his significant CKD and risk for contrast nephropathy. Discussed with nephrology. Eventual need for HD is inevitable for patient, contrast may warrant earlier HD. His fistual is not yet matured, would require temp access if indicated - patient understands implications from a kidney standpoint and wishes to proceed with cath. Will arrange LHC/RHC tomorrow  - of note has been on ASA/plavix at home I presume for his prior stroke. Appears plavix held on admit, reasonable to continue to hold until after cath in case he has surgical anatomy.      2. Acute heart failure, systolic HF - pulm edema by CXR at [facility], significant BNP elevation.  - remains on IV lasix 120mg   bid - no ACE/ARB/ARNI given renal function, if committed to HD could start - would not start beta blocker yet until more euvolemic  - yesterday started hydral 25mg  tid, imdur 15 mg - more euvolemic today, start toprol 25mg  daily  Negative 2 L yeseterday, neg 2.3 L since admission. On lasix 120mg  bid, slight uptrend in Cr. Defer diuretic dosing to nephrology given advanced renal dysfunction     3. AKI on CKD V - per nephrology - had been prepping for possible HD as outpatient.    For questions or updates, please contact [place] Please consult [email or web address] for contact info under        Signed, Carlyle Dolly, MD  03/25/2020, 10:04 AM

## 2020-03-26 ENCOUNTER — Encounter (HOSPITAL_COMMUNITY)
Admission: AD | Disposition: A | Discharge status: Skilled Nursing Facility | Payer: Self-pay | Source: Other Acute Inpatient Hospital | Attending: Internal Medicine

## 2020-03-26 DIAGNOSIS — I10 Essential (primary) hypertension: Secondary | ICD-10-CM

## 2020-03-26 DIAGNOSIS — I25110 Atherosclerotic heart disease of native coronary artery with unstable angina pectoris: Secondary | ICD-10-CM

## 2020-03-26 DIAGNOSIS — I2510 Atherosclerotic heart disease of native coronary artery without angina pectoris: Secondary | ICD-10-CM

## 2020-03-26 DIAGNOSIS — E8770 Fluid overload, unspecified: Secondary | ICD-10-CM

## 2020-03-26 DIAGNOSIS — N289 Disorder of kidney and ureter, unspecified: Secondary | ICD-10-CM

## 2020-03-26 DIAGNOSIS — E785 Hyperlipidemia, unspecified: Secondary | ICD-10-CM

## 2020-03-26 DIAGNOSIS — N185 Chronic kidney disease, stage 5: Secondary | ICD-10-CM

## 2020-03-26 DIAGNOSIS — I5041 Acute combined systolic (congestive) and diastolic (congestive) heart failure: Secondary | ICD-10-CM

## 2020-03-26 DIAGNOSIS — E1169 Type 2 diabetes mellitus with other specified complication: Secondary | ICD-10-CM

## 2020-03-26 HISTORY — PX: RIGHT/LEFT HEART CATH AND CORONARY ANGIOGRAPHY: CATH118266

## 2020-03-26 LAB — POCT I-STAT 7, (LYTES, BLD GAS, ICA,H+H)
Acid-base deficit: 5 mmol/L — ABNORMAL HIGH (ref 0.0–2.0)
Bicarbonate: 20.5 mmol/L (ref 20.0–28.0)
Calcium, Ion: 1.26 mmol/L (ref 1.15–1.40)
HCT: 28 % — ABNORMAL LOW (ref 39.0–52.0)
Hemoglobin: 9.5 g/dL — ABNORMAL LOW (ref 13.0–17.0)
O2 Saturation: 98 %
Potassium: 4.6 mmol/L (ref 3.5–5.1)
Sodium: 141 mmol/L (ref 135–145)
TCO2: 22 mmol/L (ref 22–32)
pCO2 arterial: 41.1 mmHg (ref 32.0–48.0)
pH, Arterial: 7.306 — ABNORMAL LOW (ref 7.350–7.450)
pO2, Arterial: 119 mmHg — ABNORMAL HIGH (ref 83.0–108.0)

## 2020-03-26 LAB — POCT I-STAT EG7
Acid-base deficit: 5 mmol/L — ABNORMAL HIGH (ref 0.0–2.0)
Acid-base deficit: 5 mmol/L — ABNORMAL HIGH (ref 0.0–2.0)
Bicarbonate: 21 mmol/L (ref 20.0–28.0)
Bicarbonate: 21.7 mmol/L (ref 20.0–28.0)
Calcium, Ion: 1.23 mmol/L (ref 1.15–1.40)
Calcium, Ion: 1.25 mmol/L (ref 1.15–1.40)
HCT: 28 % — ABNORMAL LOW (ref 39.0–52.0)
HCT: 28 % — ABNORMAL LOW (ref 39.0–52.0)
Hemoglobin: 9.5 g/dL — ABNORMAL LOW (ref 13.0–17.0)
Hemoglobin: 9.5 g/dL — ABNORMAL LOW (ref 13.0–17.0)
O2 Saturation: 73 %
O2 Saturation: 74 %
Potassium: 4.6 mmol/L (ref 3.5–5.1)
Potassium: 4.6 mmol/L (ref 3.5–5.1)
Sodium: 141 mmol/L (ref 135–145)
Sodium: 142 mmol/L (ref 135–145)
TCO2: 22 mmol/L (ref 22–32)
TCO2: 23 mmol/L (ref 22–32)
pCO2, Ven: 44.7 mmHg (ref 44.0–60.0)
pCO2, Ven: 45.2 mmHg (ref 44.0–60.0)
pH, Ven: 7.28 (ref 7.250–7.430)
pH, Ven: 7.29 (ref 7.250–7.430)
pO2, Ven: 43 mmHg (ref 32.0–45.0)
pO2, Ven: 44 mmHg (ref 32.0–45.0)

## 2020-03-26 LAB — BASIC METABOLIC PANEL
Anion gap: 9 (ref 5–15)
BUN: 43 mg/dL — ABNORMAL HIGH (ref 8–23)
CO2: 22 mmol/L (ref 22–32)
Calcium: 8.7 mg/dL — ABNORMAL LOW (ref 8.9–10.3)
Chloride: 107 mmol/L (ref 98–111)
Creatinine, Ser: 6.16 mg/dL — ABNORMAL HIGH (ref 0.61–1.24)
GFR calc Af Amer: 10 mL/min — ABNORMAL LOW (ref >60)
GFR calc non Af Amer: 9 mL/min — ABNORMAL LOW (ref >60)
Glucose, Bld: 208 mg/dL — ABNORMAL HIGH (ref 70–99)
Potassium: 4.4 mmol/L (ref 3.5–5.1)
Sodium: 138 mmol/L (ref 135–145)

## 2020-03-26 LAB — CBC
HCT: 29.7 % — ABNORMAL LOW (ref 39.0–52.0)
Hemoglobin: 9.5 g/dL — ABNORMAL LOW (ref 13.0–17.0)
MCH: 29.6 pg (ref 26.0–34.0)
MCHC: 32 g/dL (ref 30.0–36.0)
MCV: 92.5 fL (ref 80.0–100.0)
Platelets: 194 10*3/uL (ref 150–400)
RBC: 3.21 MIL/uL — ABNORMAL LOW (ref 4.22–5.81)
RDW: 13.2 % (ref 11.5–15.5)
WBC: 7.3 10*3/uL (ref 4.0–10.5)
nRBC: 0 % (ref 0.0–0.2)

## 2020-03-26 LAB — GLUCOSE, CAPILLARY
Glucose-Capillary: 179 mg/dL — ABNORMAL HIGH (ref 70–99)
Glucose-Capillary: 163 mg/dL — ABNORMAL HIGH (ref 70–99)
Glucose-Capillary: 143 mg/dL — ABNORMAL HIGH (ref 70–99)
Glucose-Capillary: 172 mg/dL — ABNORMAL HIGH (ref 70–99)

## 2020-03-26 LAB — LIPID PANEL
Cholesterol: 180 mg/dL (ref 0–200)
HDL: 27 mg/dL — ABNORMAL LOW (ref >40)
LDL Cholesterol: 115 mg/dL — ABNORMAL HIGH (ref 0–99)
Total CHOL/HDL Ratio: 6.7 RATIO
Triglycerides: 189 mg/dL — ABNORMAL HIGH (ref <150)
VLDL: 38 mg/dL (ref 0–40)

## 2020-03-26 LAB — HEPARIN LEVEL (UNFRACTIONATED): Heparin Unfractionated: 0.35 IU/mL (ref 0.30–0.70)

## 2020-03-26 SURGERY — RIGHT/LEFT HEART CATH AND CORONARY ANGIOGRAPHY
Anesthesia: LOCAL

## 2020-03-26 MED ORDER — VERAPAMIL HCL 2.5 MG/ML IV SOLN
INTRAVENOUS | Status: AC
  Filled 2020-03-26: qty 2

## 2020-03-26 MED ORDER — HEPARIN SODIUM (PORCINE) 5000 UNIT/ML IJ SOLN
5000.0000 [IU] | Freq: Three times a day (TID) | INTRAMUSCULAR | Status: DC
  Administered 2020-03-27 – 2020-03-28 (×4): 5000 [IU] via SUBCUTANEOUS
  Filled 2020-03-26 (×4): qty 1

## 2020-03-26 MED ORDER — ONDANSETRON HCL 4 MG/2ML IJ SOLN: 4.0000 mg | Freq: Four times a day (QID) | INTRAMUSCULAR | Status: DC | PRN

## 2020-03-26 MED ORDER — LIDOCAINE HCL (PF) 1 % IJ SOLN
INTRAMUSCULAR | Status: DC | PRN
  Administered 2020-03-26: 15 mL

## 2020-03-26 MED ORDER — SODIUM CHLORIDE 0.9 % IV SOLN
INTRAVENOUS | Status: AC | PRN
  Administered 2020-03-26: 10 mL/h via INTRAVENOUS

## 2020-03-26 MED ORDER — SODIUM CHLORIDE 0.9 % IV SOLN: 250.0000 mL | INTRAVENOUS | Status: DC | PRN

## 2020-03-26 MED ORDER — HEPARIN (PORCINE) IN NACL 1000-0.9 UT/500ML-% IV SOLN
INTRAVENOUS | Status: DC | PRN
  Administered 2020-03-26 (×2): 500 mL

## 2020-03-26 MED ORDER — HYDRALAZINE HCL 25 MG PO TABS
25.0000 mg | ORAL_TABLET | Freq: Once | ORAL | Status: AC
  Administered 2020-03-26: 25 mg via ORAL
  Filled 2020-03-26: qty 1

## 2020-03-26 MED ORDER — ACETAMINOPHEN 325 MG PO TABS: 650.0000 mg | ORAL_TABLET | ORAL | Status: DC | PRN

## 2020-03-26 MED ORDER — DIAZEPAM 5 MG PO TABS: 5.0000 mg | ORAL_TABLET | Freq: Four times a day (QID) | ORAL | Status: DC | PRN

## 2020-03-26 MED ORDER — MIDAZOLAM HCL 2 MG/2ML IJ SOLN
INTRAMUSCULAR | Status: DC | PRN
  Administered 2020-03-26 (×2): 1 mg via INTRAVENOUS

## 2020-03-26 MED ORDER — LIDOCAINE HCL (PF) 1 % IJ SOLN
INTRAMUSCULAR | Status: AC
  Filled 2020-03-26: qty 30

## 2020-03-26 MED ORDER — HYDRALAZINE HCL 50 MG PO TABS
50.0000 mg | ORAL_TABLET | Freq: Three times a day (TID) | ORAL | Status: DC
  Administered 2020-03-26 – 2020-03-29 (×8): 50 mg via ORAL
  Filled 2020-03-26 (×8): qty 1

## 2020-03-26 MED ORDER — METOPROLOL SUCCINATE ER 25 MG PO TB24
25.0000 mg | ORAL_TABLET | Freq: Every day | ORAL | Status: DC
  Administered 2020-03-26 – 2020-03-29 (×4): 25 mg via ORAL
  Filled 2020-03-26 (×4): qty 1

## 2020-03-26 MED ORDER — LABETALOL HCL 5 MG/ML IV SOLN: 10.0000 mg | INTRAVENOUS | Status: AC | PRN

## 2020-03-26 MED ORDER — HYDRALAZINE HCL 20 MG/ML IJ SOLN: 10.0000 mg | INTRAMUSCULAR | Status: AC | PRN

## 2020-03-26 MED ORDER — FENTANYL CITRATE (PF) 100 MCG/2ML IJ SOLN
INTRAMUSCULAR | Status: AC
  Filled 2020-03-26: qty 2

## 2020-03-26 MED ORDER — MIDAZOLAM HCL 2 MG/2ML IJ SOLN
INTRAMUSCULAR | Status: AC
  Filled 2020-03-26: qty 2

## 2020-03-26 MED ORDER — SODIUM CHLORIDE 0.9 % IV SOLN: INTRAVENOUS | Status: AC

## 2020-03-26 MED ORDER — ATORVASTATIN CALCIUM 80 MG PO TABS: 80.0000 mg | ORAL_TABLET | Freq: Every day | ORAL | Status: DC

## 2020-03-26 MED ORDER — FENTANYL CITRATE (PF) 100 MCG/2ML IJ SOLN
INTRAMUSCULAR | Status: DC | PRN
  Administered 2020-03-26 (×2): 25 ug via INTRAVENOUS

## 2020-03-26 MED ORDER — HEPARIN SODIUM (PORCINE) 1000 UNIT/ML IJ SOLN
INTRAMUSCULAR | Status: AC
  Filled 2020-03-26: qty 1

## 2020-03-26 MED ORDER — SODIUM CHLORIDE 0.9% FLUSH: 3.0000 mL | INTRAVENOUS | Status: DC | PRN

## 2020-03-26 MED ORDER — IOHEXOL 350 MG/ML SOLN
INTRAVENOUS | Status: DC | PRN
  Administered 2020-03-26: 17 mL

## 2020-03-26 MED ORDER — SODIUM CHLORIDE 0.9% FLUSH
3.0000 mL | Freq: Two times a day (BID) | INTRAVENOUS | Status: DC
  Administered 2020-03-27 – 2020-03-29 (×3): 3 mL via INTRAVENOUS

## 2020-03-26 MED ORDER — HEPARIN (PORCINE) IN NACL 1000-0.9 UT/500ML-% IV SOLN
INTRAVENOUS | Status: AC
  Filled 2020-03-26: qty 1000

## 2020-03-26 MED ORDER — ASPIRIN 81 MG PO CHEW
81.0000 mg | CHEWABLE_TABLET | Freq: Every day | ORAL | Status: DC
  Administered 2020-03-27 – 2020-04-06 (×11): 81 mg via ORAL
  Filled 2020-03-26 (×11): qty 1

## 2020-03-26 SURGICAL SUPPLY — 10 items
CATH INFINITI 5FR MULTPACK ANG (CATHETERS) ×1 IMPLANT
CATH SWAN GANZ 7F STRAIGHT (CATHETERS) ×1 IMPLANT
GLIDESHEATH SLEND SS 6F .021 (SHEATH) IMPLANT
KIT HEART LEFT (KITS) ×2 IMPLANT
PACK CARDIAC CATHETERIZATION (CUSTOM PROCEDURE TRAY) ×2 IMPLANT
SHEATH PINNACLE 5F 10CM (SHEATH) ×1 IMPLANT
SHEATH PINNACLE 7F 10CM (SHEATH) ×1 IMPLANT
TRANSDUCER W/STOPCOCK (MISCELLANEOUS) ×2 IMPLANT
TUBING CIL FLEX 10 FLL-RA (TUBING) ×2 IMPLANT
WIRE EMERALD 3MM-J .035X150CM (WIRE) ×1 IMPLANT

## 2020-03-26 NOTE — Progress Notes (Signed)
RN called and spoke with Marzetta Board, daughter, to make aware that pt will be going to the cath lab after lunch, per RN in cath lab.   Eleanora Neighbor, RN

## 2020-03-26 NOTE — Progress Notes (Signed)
[place] for heparin Indication: chest pain/ACS  No Known Allergies  Patient Measurements: Height: 5\' 10"  (177.8 cm) Weight: 100.8 kg (222 lb 3.6 oz) IBW/kg (Calculated) : 73 Heparin Dosing Weight: 94.1 kg  Vital Signs: Temp: 98 F (36.7 C) (07/19 0921) Temp Source: Oral (07/19 0921) BP: 176/76 (07/19 0921) Pulse Rate: 69 (07/19 0921)  Labs: Recent Labs    03/24/20 0615 03/24/20 0615 03/24/20 1226 03/24/20 1523 03/24/20 2152 03/25/20 0451 03/26/20 0505  HGB 9.4*   < >  --   --   --  9.7* 9.5*  HCT 29.7*  --   --   --   --  30.6* 29.7*  PLT 186  --   --   --   --  169 194  APTT  --   --   --  >200*  --   --   --   LABPROT  --   --   --  16.4*  --   --   --   INR  --   --   --  1.4*  --   --   --   HEPARINUNFRC  --   --   --   --  0.51 0.59 0.35  CREATININE 5.78*  --   --   --   --  5.90* 6.16*  TROPONINIHS 7,090*  --  7,421*  --   --   --   --    < > = values in this interval not displayed.    Estimated Creatinine Clearance: 15 mL/min (A) (by C-G formula based on SCr of 6.16 mg/dL (H)).   Medical History: Past Medical History:  Diagnosis Date  . Arthritis   . Carpal tunnel syndrome 10/18/2014   Bilateral  . Chronic leg pain    since last back surgery  . DDD (degenerative disc disease), lumbar   . Depression   . Diabetes mellitus    Type II  . Gastroparesis   . GERD (gastroesophageal reflux disease)   . Headache   . Hyperlipidemia   . Hypertension   . Neuropathy   . Stroke [facility]) March , July 2017   right arm    Medications:  Scheduled:  . aspirin EC  81 mg Oral Daily  . atorvastatin  80 mg Oral Daily  . hydrALAZINE  25 mg Oral TID  . insulin aspart  0-6 Units Subcutaneous TID WC  . isosorbide mononitrate  15 mg Oral Daily  . nicotine  14 mg Transdermal Daily  . sodium chloride flush  3 mL Intravenous Q12H  . sodium chloride flush  3 mL Intravenous Q12H    Assessment: William Schroeder is a 59 YO male with  history of T2DM, CVA, HTN, CKD stage V admitted for SOB, orthopnea, and abdominal distension.  He was transferred from [place] for hypoxia (O2 sat in 80's). Will begin heparin for anticoagulation due to chest pain/ACS.   Heparin level remains therapeutic at 0.35, on 1400 units/hr. Hgb 9.5, plt 194. No s/sx of bleeding or infusion issues.    Goal of Therapy:  Heparin level 0.3-0.7 units/ml Monitor platelets by anticoagulation protocol: Yes   Plan:  Cont heparin at 1400 units/hr Daily heparin level with AM labs Monitor daily CBC  Antonietta Jewel, PharmD, [place] Pharmacist  Phone: [phone] 03/26/2020 9:52 AM  Please check AMION for all [place] phone numbers After 10:00 PM, call [place] [phone]

## 2020-03-26 NOTE — Interval H&P Note (Signed)
Cath Lab Visit (complete for each Cath Lab visit)  Clinical Evaluation Leading to the Procedure:   ACS: Yes.    Non-ACS:    Anginal Classification: CCS III  Anti-ischemic medical therapy: Minimal Therapy (1 class of medications)  Non-Invasive Test Results: No non-invasive testing performed  Prior CABG: No previous CABG      History and Physical Interval Note:  03/26/2020 3:42 PM  William Schroeder  has presented today for surgery, with the diagnosis of NSTEMI.  The various methods of treatment have been discussed with the patient and family. After consideration of risks, benefits and other options for treatment, the patient has consented to  Procedure(s): RIGHT/LEFT HEART CATH AND CORONARY ANGIOGRAPHY (N/A) as a surgical intervention.  The patient's history has been reviewed, patient examined, no change in status, stable for surgery.  I have reviewed the patient's chart and labs.  Questions were answered to the patient's satisfaction.     Shelva Majestic

## 2020-03-26 NOTE — Progress Notes (Signed)
Admit: 03/23/2020 LOS: 3  63M progresive CKD5 with NSTEMI  Subjective:  . Cardiology following TTE this AM . lhc today, no complaints. Off diuretics and had ~2.6L uop.  07/18 0701 - 07/19 0700 In: 1020 [P.O.:1020] Out: 2590 [Urine:2590]  Filed Weights   03/24/20 0222 03/25/20 0500 03/26/20 0500  Weight: 100.8 kg 100.8 kg 100.8 kg    Scheduled Meds: . aspirin EC  81 mg Oral Daily  . atorvastatin  80 mg Oral Daily  . hydrALAZINE  50 mg Oral TID  . insulin aspart  0-6 Units Subcutaneous TID WC  . isosorbide mononitrate  15 mg Oral Daily  . nicotine  14 mg Transdermal Daily  . sodium chloride flush  3 mL Intravenous Q12H  . sodium chloride flush  3 mL Intravenous Q12H   Continuous Infusions: . sodium chloride    . sodium chloride    . sodium chloride    . heparin 1,400 Units/hr (03/26/20 0305)   PRN Meds:.sodium chloride, sodium chloride, acetaminophen, ondansetron (ZOFRAN) IV, sodium chloride flush, sodium chloride flush  Current Labs: reviewed  Results for William Schroeder, William Schroeder (MRN [id number]) as of 03/25/2020 10:20  Ref. Range 03/24/2020 06:15  Saturation Ratios Latest Ref Range: 17.9 - 39.5 % 36  Ferritin Latest Ref Range: 24 - 336 ng/mL 26    Physical Exam:  Blood pressure (!) 176/76, pulse 69, temperature 98 F (36.7 C), temperature source Oral, resp. rate 18, height 5\' 10"  (1.778 m), weight 100.8 kg, SpO2 99 %. GEN: NAD. Lying flat in bed ENT: NCAT EYES: EOMI CV: RRR no rub PULM: cta bl, no w/r/r/c, unlabored ABD: s/nt/nd SKIN: no rashes/lesions; LUE AVF incision well healed EXT:no sig LEE LUE AVF +B/T   Recent Labs  Lab 03/24/20 0615 03/24/20 1226 03/25/20 0451 03/26/20 0505  NA 138  --  139 138  K 4.9  --  4.5 4.4  CL 111  --  109 107  CO2 18*  --  17* 22  GLUCOSE 116*  --  125* 208*  BUN 43*  --  44* 43*  CREATININE 5.78*  --  5.90* 6.16*  CALCIUM 8.6*  --  8.7* 8.7*  PHOS  --  5.7*  --   --    Recent Labs  Lab 03/24/20 0615 03/25/20 0451  03/26/20 0505  WBC 8.3 7.9 7.3  NEUTROABS 4.5  --   --   HGB 9.4* 9.7* 9.5*  HCT 29.7* 30.6* 29.7*  MCV 95.5 95.0 92.5  PLT 186 169 194      A 1. CKD5 followed by [facility] / [facility];  Appears is progressive, SCr 4.6 last May.  SOB / Hypoxia / Pulm Edema, improved 2. NSTEMI 3. LUE AVF +B/T, healing well 4. DM2 5. HTN, not sure home regimen, stable now 6. 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 Left heart cath today.  Discussed with patient possibility of progressing to end stage kidney disease but will monitor for renal replacement needs moving forward.  If this were to be started while he is inpatient then we will need to get a tunneled dialysis catheter asses fistula does not seem its ready to be used/accessed just yet. . Start diuresis if signs of hypervolemia. Goal is to maintain euvolemia pre and post contrast administration. . Renal diet . Daily weights, Daily Renal Panel, Strict I/Os, Avoid nephrotoxins (NSAIDs, judicious IV Contrast)     Gean Quint, MD [facility] 03/26/2020, 2:17 PM

## 2020-03-26 NOTE — Progress Notes (Addendum)
Progress Note  Patient Name: William Schroeder Date of Encounter: 03/26/2020  [facility] Cardiologist: Carlyle Dolly, MD new [facility]  Subjective   No chest pain and no SOB   Inpatient Medications    Scheduled Meds: . aspirin EC  81 mg Oral Daily  . atorvastatin  80 mg Oral Daily  . hydrALAZINE  25 mg Oral TID  . insulin aspart  0-6 Units Subcutaneous TID WC  . isosorbide mononitrate  15 mg Oral Daily  . nicotine  14 mg Transdermal Daily  . sodium chloride flush  3 mL Intravenous Q12H  . sodium chloride flush  3 mL Intravenous Q12H   Continuous Infusions: . sodium chloride    . sodium chloride    . sodium chloride    . heparin 1,400 Units/hr (03/26/20 0305)   PRN Meds: sodium chloride, sodium chloride, acetaminophen, ondansetron (ZOFRAN) IV, sodium chloride flush, sodium chloride flush   Vital Signs    Vitals:   03/25/20 2057 03/26/20 0500 03/26/20 0555 03/26/20 0921  BP: (!) 156/70  (!) 150/64 (!) 176/76  Pulse: 69  65 69  Resp: 18  18 18   Temp: 98.7 F (37.1 C)  98.4 F (36.9 C) 98 F (36.7 C)  TempSrc: Oral  Oral Oral  SpO2: 95%  96% 99%  Weight:  100.8 kg    Height:        Intake/Output Summary (Last 24 hours) at 03/26/2020 0932 Last data filed at 03/26/2020 0555 Gross per 24 hour  Intake 720 ml  Output 2190 ml  Net -1470 ml   Last 3 Weights 03/26/2020 03/25/2020 03/24/2020  Weight (lbs) 222 lb 3.6 oz 222 lb 3.6 oz 222 lb 3.6 oz  Weight (kg) 100.8 kg 100.8 kg 100.8 kg  Some encounter information is confidential and restricted. Go to Review Flowsheets activity to see all data.      Telemetry    SR - Personally Reviewed  ECG    No new - Personally Reviewed  Physical Exam   GEN: No acute distress.   Neck: No JVD Cardiac: RRR, no murmurs, rubs, or gallops.  Respiratory: Clear to auscultation bilaterally. GI: Soft, nontender, non-distended  MS: No edema; No deformity. Neuro:  Nonfocal  Psych: Normal affect   Labs    High Sensitivity  Troponin:   Recent Labs  Lab 03/24/20 0615 03/24/20 1226  TROPONINIHS 7,090* 7,421*      Chemistry Recent Labs  Lab 03/24/20 0615 03/25/20 0451 03/26/20 0505  NA 138 139 138  K 4.9 4.5 4.4  CL 111 109 107  CO2 18* 17* 22  GLUCOSE 116* 125* 208*  BUN 43* 44* 43*  CREATININE 5.78* 5.90* 6.16*  CALCIUM 8.6* 8.7* 8.7*  GFRNONAA 10* 9* 9*  GFRAA 11* 11* 10*  ANIONGAP 9 13 9      Hematology Recent Labs  Lab 03/24/20 0615 03/25/20 0451 03/26/20 0505  WBC 8.3 7.9 7.3  RBC 3.11* 3.22* 3.21*  HGB 9.4* 9.7* 9.5*  HCT 29.7* 30.6* 29.7*  MCV 95.5 95.0 92.5  MCH 30.2 30.1 29.6  MCHC 31.6 31.7 32.0  RDW 13.5 13.3 13.2  PLT 186 169 194    BNPNo results for input(s): BNP, PROBNP in the last 168 hours.   DDimer No results for input(s): DDIMER in the last 168 hours.   Radiology    n/a  Cardiac Studies   ECHO 03/25/20: The left ventricle has mildly decreased function EF ~45%. The anteroseptal wall is hypokinetic. The entire apex is  hypokinetic. Mild LVH - indeterminate diastolic pressures.  Normal RV.  Normal valves.  Normal atrial size.  Patient Profile     59 y.o. male with a hx of CKD V, HTN, DM2, CVA who is being seen today for the evaluation of SOB and NSTEMI.  Assessment & Plan    1. NSTEMI-presenting as acute CHF-> significant troponin elevation with new diffuse T wave inversions, mildly reduced EF with segmental wall motion of normality consistent with LAD disease. -> Denies ever having any angina -sudden worsening of dyspnea after initial implant progression.  Remains on aspirin, high-dose statin, heparin drip  Beta-blocker on hold until volume status determined/euvolemic.  - per Dr. Harl Bowie ". - discussed with patient indications for cath, particularly given his significant CKD and risk for contrast nephropathy. Discussed with nephrology. Eventual need for HD is inevitable for patient, contrast may warrant earlier HD. His fistual is not yet matured, would  require temp access if indicated - patient understands implications from a kidney standpoint and wishes to proceed with cath.   --R&L cath planned for today     2. Acute combined systolic and diastolic heart failure, --with reduced EF and fluid volume overload I suspect that this is a combination of both systolic and diastolic heart failure. - pulm edema by CXR at [facility], significant BNP elevation.  - received lasix IV 120mg  with some improvement in breathing.  -Has been on IV lasix 120mg  bid - but held as of yesterday PM for Cath today.  - neg 7425 and wt unchanged. ??  Clearly not accurate - no ACE/ARB/ARNI given renal function, if committed to HD could start -but with questionable benefit -Plan was to start Toprol 25 mg a day. -Was started on hydralazine 25 mg 3 times daily plus Imdur 15 mg daily. Will increase today  to 50 mg TID   3. AKI on CKD V - per nephrology - had been prepping for possible HD as outpatient.  - see note   4. Tobacco use on nicoderm  5.  HLD on lipitor 80 no lipids noted will check   6. Anemia of chronic disease followed by IM     For questions or updates, please contact [facility] Please consult [email or web address] for contact info under        Signed, Cecilie Kicks, NP  03/26/2020, 9:32 AM    ATTENDING ATTESTATION  I have seen, examined and evaluated the patient this PM (was present in the Cath Lab, and reviewed films with Dr. Claiborne Billings) after initial evaluation by Cecilie Kicks, NP.  After reviewing all the available data and chart, we discussed the patients laboratory, study & physical findings as well as symptoms in detail. I agree with her findings, examination as well as impression recommendations as per our discussion.    Attending adjustments noted in italics.   Cardiac Cath Summary: Coronary calcification with a 90% calcified proximal LAD stenosis, 30% first diagonal 30% LAD stenosis after the diagonal takeoff; dominant left circumflex  coronary artery with calcification in 20 and 80% circumflex marginal stenoses, 30 and 80% PDA stenosis; small nondominant RCA.  Mildly elevated right heart pressure with PA pressure at 34/19; mean PA pressure 24 mm.  Contrast used: 17 cc  RECOMMENDATION: We will need to monitor renal function post catheterization.  Patient will need intervention to his proximal LAD once stable from a renal standpoint.  Aggressive lipid-lowering therapy.   -- Mr. Lightsey presented with symptoms that sound like heart failure and clearly presented with  acute combined systolic and diastolic CHF-EF 29% with LAD distribution wall motion and normality on echo consistent with findings on the cath with culprit lesion being the significant proximal LAD lesion.  Right heart cath numbers were essentially normal so he does appear to be euvolemic.  On my evaluation post-cath, he is doing relatively well.  Not having any significant symptoms.  He is hungry. Several questions were answered, but he himself was still undecided about whether he wanted to talk to heart surgeon.  For now recommend starting beta-blocker, agree with increasing hydralazine.  For hypertension. Convert to oral Lasix for at least tomorrow.  We have tentatively scheduled likely atherectomy PCI of the LAD on Wednesday.  We will discuss with colleagues tomorrow to determine benefits of potentially obtaining CVTS consultation since he is diabetic.  The patient does not seem to be excited about the concept of open heart surgery, and is well aware of the fact that he is soon to go onto dialysis and understands that this may be the impetus for this change.  Either revascularization option has a high likelihood of pushing him toward dialysis.  He only really has 1 good target vessel for CABG revascularization -> the LAD.  We will readdress with him tomorrow, but will tentatively schedule for Wednesday PCI with Dr. Claiborne Billings.  With relatively preserved EF and  normal right heart cath numbers, do not think that he would definitely benefit from mechanical support.  Will need to practice ultralow contrast techniques to avoid progression to do dialysis if at all possible.  I do suspect that he will need atherectomy (may use IVUS to determine extent of calcification with options for atherectomy being orbital, rotational or shockwave lithotripsy) --we will discuss with Dr. Claiborne Billings.  If the decision made tomorrow that he wants to go forward with PCI, will load with Brilinta.  For now continue aspirin.  With no active anginal symptoms, will hold off on IV heparin, however if he were to have any worsening symptoms, would convert from subcu DVT prophylaxis to IV dosing.     Glenetta Hew, M.D., M.S. Interventional Cardiologist   Pager # [phone] Phone # [phone] [address] [place], [place] [address]

## 2020-03-26 NOTE — Progress Notes (Signed)
PROGRESS NOTE    William Schroeder  WER:[id number] DOB: 01-20-1961 DOA: 03/23/2020 PCP: [facility]    Brief Narrative: HPI per Dr. Myna Hidalgo.  03/23/2020.  William Schroeder is a 59 y.o. male with medical history significant for insulin-dependent diabetes mellitus, history of CVA, hypertension, and chronic kidney disease stage V, now presenting to the emergency department for evaluation of shortness of breath.  Patient reports some chronic dyspnea and notes that he typically sleeps in a recliner because of shortness of breath and back pain when he tries to lay flat.  His shortness of breath was worse than usual on 03/22/2020, he tried to sleep been a bed that night, but developed severe worsening in his dyspnea and felt as though he cannot catch his breath even when he sat back up.  His ex-wife was with him, noted that he appeared pale and struggling to breathe, and EMS was called.  Patient denies any cough, denies fevers or chills, and has not experienced any chest discomfort or palpitations associated with this.  He denies any leg swelling but reports that his abdomen was swollen recently.  [facility] ED Course: Upon arrival to the ED, patient is found to be saturating in the mid 80s on room air, afebrile, and with stable blood pressure.  EKG featured a sinus rhythm with T wave abnormality.  Chest x-ray was concerning for pulmonary edema.  Chemistry panel was notable for creatinine of 5.37 and potassium 5.3.  proBNP was elevated to 30,000 and troponin elevated to 1884.  Nephrology and cardiology were consulted by the ED physician and admission to [facility] was recommended.  Patient was given 120 mg of IV Lasix in the ED with normalization and potassium, improvement in his oxygen requirement, subjective improvement in his shortness of breath, but his troponin continued to rise, reaching 6000 just prior to transfer.  On arrival to [facility], patient reports that his dyspnea is much improved, he  is able to lay back now, and continues to deny any chest discomfort.  03/24/2020 patient resting in bed on 5 L of oxygen.  He did not have oxygen prior to admission to hospital.  He feels his breathing is better after Lasix.  He lives at home with his daughter.  Apparently his daughter is also in the hospital. Denies any chest pain.  Assessment & Plan:   Principal Problem:   Hypervolemia associated with renal insufficiency Active Problems:   Diabetes mellitus, type 2 (HCC)   Hypertension   CKD (chronic kidney disease), stage V (HCC)   Acute respiratory failure with hypoxia (HCC)   Non-STEMI (non-ST elevated myocardial infarction) (HCC)   Hyperkalemia  #1 NSTEMI-patient presented with worsening shortness of breath dyspnea on exertion chest x-ray with pulmonary edema elevated BNP and increased elevated troponins. Lasix held yesterday evening dose and today in  preparation for cath per nephrology recommendations to hold for 24 hours prior to catheterization.  He was on Lasix 120 mg twice a day. Continue Lasix per nephrology.  120 mg twice a day. troponin 7090, 7421  EKG done today shows T wave inversions in multiple leads Continue aspirin statin. Plan for cath today  Continue Heparin drip Negative by 3600 since admission. Nephrology recommended to hold Lasix for 24 hours prior to cath  #2 AKI on CKD stage V-unfortunately creatinine trending up 6.16 up from 5.78 .Has been preparing for dialysis as an outpatient with AV fistula in place.  Suspect he might need dialysis sooner than expected with  cardiac cath.  Nephrology following.  #3 history of type 2 diabetes-hemoglobin A1c of 7.2 CBG (last 3)  Recent Labs    03/25/20 1647 03/25/20 2057 03/26/20 0645  GLUCAP 145* 172* 179*     He was on Glucophage and insulin prior to admission. Continue SSI for now.  #4 history of stroke on aspirin and statin.  #5 acute heart failure with pulmonary edema continue Lasix.  Echocardiogram  03/24/2020 ejection fraction 45% anteroseptal wall is hypokinetic entire apex is hypokinetic left ventricle demonstrates regional wall motion abnormalities mild LVH.  Right ventricular systolic function normal. Cardiology to start Toprol.  #6 anemia of chronic renal disease likely monitor daily  #7 essential hypertension on Norvasc and lisinopril prior to admission.  On hydralazine and Imdur.  #8 history of hyperlipidemia on Zocor  #9 tobacco abuse continue nicotine patch  Estimated body mass index is 31.89 kg/m as calculated from the following:   Height as of this encounter: 5\' 10"  (1.778 m).   Weight as of this encounter: 100.8 kg.  DVT prophylaxis: Heparin Code Status: Full code Family Communication: None at bedside  disposition Plan:  Status is: Inpatient  Dispo: The patient is from: Home              Anticipated d/c is [id number]              Anticipated d/c date [id number]              Patient currently is not medically stable to d/c.  Patient admitted with pulmonary edema new onset with CKD AKI and heart failure and nstemi   Consultants:   Cardiology and nephrology  Procedures: None Antimicrobials: None  Subjective: He is resting in bed overnight no events denies any further chest pain feels breathing is okay Objective: Vitals:   03/25/20 2057 03/26/20 0500 03/26/20 0555 03/26/20 0921  BP: (!) 156/70  (!) 150/64 (!) 176/76  Pulse: 69  65 69  Resp: 18  18 18   Temp: 98.7 F (37.1 C)  98.4 F (36.9 C) 98 F (36.7 C)  TempSrc: Oral  Oral Oral  SpO2: 95%  96% 99%  Weight:  100.8 kg    Height:        Intake/Output Summary (Last 24 hours) at 03/26/2020 1021 Last data filed at 03/26/2020 0900 Gross per 24 hour  Intake 1285.98 ml  Output 2390 ml  Net -1104.02 ml   Filed Weights   03/24/20 0222 03/25/20 0500 03/26/20 0500  Weight: 100.8 kg 100.8 kg 100.8 kg    Examination:  General exam: Appears calm and comfortable  Respiratory system: Crackles bilateral  bases to auscultation. Respiratory effort normal. Cardiovascular system: S1 & S2 heard, RRR. No JVD, murmurs, rubs, gallops or clicks. No pedal edema. Gastrointestinal system: Abdomen is nondistended, soft and nontender. No organomegaly or masses felt. Normal bowel sounds heard. Central nervous system: Alert and oriented. No focal neurological deficits. Extremities: 1+ pitting edema bilateral  skin: No rashes, lesions or ulcers Psychiatry: Judgement and insight appear normal. Mood & affect appropriate.     Data Reviewed: I have personally reviewed following labs and imaging studies  CBC: Recent Labs  Lab 03/24/20 0615 03/25/20 0451 03/26/20 0505  WBC 8.3 7.9 7.3  NEUTROABS 4.5  --   --   HGB 9.4* 9.7* 9.5*  HCT 29.7* 30.6* 29.7*  MCV 95.5 95.0 92.5  PLT 186 169 578   Basic Metabolic Panel: Recent Labs  Lab 03/24/20 0615 03/24/20 1226 03/25/20 0451 03/26/20  0505  NA 138  --  139 138  K 4.9  --  4.5 4.4  CL 111  --  109 107  CO2 18*  --  17* 22  GLUCOSE 116*  --  125* 208*  BUN 43*  --  44* 43*  CREATININE 5.78*  --  5.90* 6.16*  CALCIUM 8.6*  --  8.7* 8.7*  MG 2.2  --   --   --   PHOS  --  5.7*  --   --    GFR: Estimated Creatinine Clearance: 15 mL/min (A) (by C-G formula based on SCr of 6.16 mg/dL (H)). Liver Function Tests: No results for input(s): AST, ALT, ALKPHOS, BILITOT, PROT, ALBUMIN in the last 168 hours. No results for input(s): LIPASE, AMYLASE in the last 168 hours. No results for input(s): AMMONIA in the last 168 hours. Coagulation Profile: Recent Labs  Lab 03/24/20 1523  INR 1.4*   Cardiac Enzymes: No results for input(s): CKTOTAL, CKMB, CKMBINDEX, TROPONINI in the last 168 hours. BNP (last 3 results) No results for input(s): PROBNP in the last 8760 hours. HbA1C: Recent Labs    03/24/20 0615  HGBA1C 7.2*   CBG: Recent Labs  Lab 03/25/20 0645 03/25/20 1113 03/25/20 1647 03/25/20 2057 03/26/20 0645  GLUCAP 118* 193* 145* 172* 179*    Lipid Profile: No results for input(s): CHOL, HDL, LDLCALC, TRIG, CHOLHDL, LDLDIRECT in the last 72 hours. Thyroid Function Tests: No results for input(s): TSH, T4TOTAL, FREET4, T3FREE, THYROIDAB in the last 72 hours. Anemia Panel: Recent Labs    03/24/20 0615  FERRITIN 26  TIBC 462*  IRON 167   Sepsis Labs: No results for input(s): PROCALCITON, LATICACIDVEN in the last 168 hours.  No results found for this or any previous visit (from the past 240 hour(s)).       Radiology Studies: ECHOCARDIOGRAM COMPLETE  Result Date: 03/25/2020    ECHOCARDIOGRAM REPORT   Patient Name:   William Schroeder Date of Exam: 03/25/2020 Medical Rec #:  [id number]      Height:       70.0 in Accession #:    [id number]     Weight:       222.2 lb Date of Birth:  16-Jul-1961      BSA:          2.183 m Patient Age:    32 years       BP:           137/67 mmHg Patient Gender: M              HR:           66 bpm. Exam Location:  Inpatient Procedure: 2D Echo Indications:    elevated troponin  History:        Patient has prior history of Echocardiogram examinations, most                 recent 04/12/2013. Stroke; Risk Factors:Hypertension, Diabetes,                 Dyslipidemia and Former Smoker.  Sonographer:    Jannett Celestine RDCS (AE) Referring Phys: [id number] Ramer  1. The anteroseptal wall is hypokinetic. The entire apex is hypokinetic. Marland Kitchen Left ventricular ejection fraction, by estimation, is 45%. The left ventricle has mildly decreased function. The left ventricle demonstrates regional wall motion abnormalities (see scoring diagram/findings for description). There is mild left ventricular hypertrophy. Left ventricular diastolic parameters are indeterminate.  2. Right  ventricular systolic function is normal. The right ventricular size is normal.  3. The mitral valve is normal in structure. No evidence of mitral valve regurgitation. No evidence of mitral stenosis.  4. The aortic valve is tricuspid. Aortic  valve regurgitation is not visualized. No aortic stenosis is present. FINDINGS  Left Ventricle: The anteroseptal wall is hypokinetic. The entire apex is hypokinetic. Left ventricular ejection fraction, by estimation, is 45%. The left ventricle has mildly decreased function. The left ventricle demonstrates regional wall motion abnormalities. The left ventricular internal cavity size was normal in size. There is mild left ventricular hypertrophy. Left ventricular diastolic parameters are indeterminate. Right Ventricle: The right ventricular size is normal. No increase in right ventricular wall thickness. Right ventricular systolic function is normal. Left Atrium: Left atrial size was normal in size. Right Atrium: Right atrial size was normal in size. Pericardium: There is no evidence of pericardial effusion. Mitral Valve: The mitral valve is normal in structure. No evidence of mitral valve regurgitation. No evidence of mitral valve stenosis. Tricuspid Valve: The tricuspid valve is not well visualized. Tricuspid valve regurgitation is trivial. No evidence of tricuspid stenosis. Aortic Valve: The aortic valve is tricuspid. Aortic valve regurgitation is not visualized. No aortic stenosis is present. Pulmonic Valve: The pulmonic valve was not well visualized. Pulmonic valve regurgitation is not visualized. No evidence of pulmonic stenosis. Aorta: The aortic root is normal in size and structure. Pulmonary Artery: Indeterminant PASP, inadequate TR jet. Venous: The inferior vena cava was not well visualized. IAS/Shunts: The interatrial septum was not well visualized.  LEFT VENTRICLE PLAX 2D LVIDd:         5.30 cm  Diastology LVIDs:         3.60 cm  LV e' lateral:   7.94 cm/s LV PW:         1.20 cm  LV E/e' lateral: 9.1 LV IVS:        1.10 cm  LV e' medial:    6.42 cm/s LVOT diam:     2.20 cm  LV E/e' medial:  11.2 LV SV:         75 LV SV Index:   34 LVOT Area:     3.80 cm  LEFT ATRIUM             Index       RIGHT ATRIUM            Index LA diam:        4.60 cm 2.11 cm/m  RA Area:     18.10 cm LA Vol (A2C):   61.5 ml 28.17 ml/m RA Volume:   43.90 ml  20.11 ml/m LA Vol (A4C):   58.2 ml 26.66 ml/m LA Biplane Vol: 59.7 ml 27.35 ml/m  AORTIC VALVE LVOT Vmax:   93.10 cm/s LVOT Vmean:  73.000 cm/s LVOT VTI:    0.197 m  AORTA Ao Root diam: 3.20 cm MITRAL VALVE MV Area (PHT): 3.21 cm    SHUNTS MV Decel Time: 236 msec    Systemic VTI:  0.20 m MV E velocity: 72.00 cm/s  Systemic Diam: 2.20 cm MV A velocity: 78.80 cm/s MV E/A ratio:  0.91 Carlyle Dolly MD Electronically signed by Carlyle Dolly MD Signature Date/Time: 03/25/2020/10:18:33 AM    Final         Scheduled Meds: . aspirin EC  81 mg Oral Daily  . atorvastatin  80 mg Oral Daily  . hydrALAZINE  50 mg Oral TID  . insulin aspart  0-6 Units Subcutaneous TID WC  . isosorbide mononitrate  15 mg Oral Daily  . nicotine  14 mg Transdermal Daily  . sodium chloride flush  3 mL Intravenous Q12H  . sodium chloride flush  3 mL Intravenous Q12H   Continuous Infusions: . sodium chloride    . sodium chloride    . sodium chloride    . heparin 1,400 Units/hr (03/26/20 0305)     LOS: 3 days     Georgette Shell, MD  03/26/2020, 10:21 AM

## 2020-03-26 NOTE — Progress Notes (Addendum)
Femoral venous and arterial sheaths removed in Cath Lab by [place] Bing, RN. Venous held for 10 minutes, arterial held for 20 minutes. Site level 0 with tegaderm applied. Bed rest starting at 1650.  DP paplable.

## 2020-03-26 NOTE — Progress Notes (Signed)
Nutrition Education Note  RD consulted for nutrition education regarding low sodium diet.  Lab Results  Component Value Date   HGBA1C 7.2 (H) 03/24/2020   PTA DM medications are 62 units insulin glargine at bedtime and 8-13 units insulin lispro TID before meals, 50 units insulin NPH BID with meals, and 100 mg metformin BID.   Labs reviewed: CBGS: 145-179 (inpatient orders for glycemic control are 0-6 units insulin aspart TID with meals).   Attempted to speak with pt via phone call to room, however, no answer. Noted plan to go down to cath lab after lunch per RN notes.   RD provided "Heart Healthy, Consistent Carbohydrate Nutrition Therapy" handout from the Academy of Nutrition and Dietetics. Attached to AVS/ discharge summary.   Current diet order is Heart Healthy (currently NPO for cath today), patient is consuming approximately 100% of meals at this time. Labs and medications reviewed. No further nutrition interventions warranted at this time. RD contact information provided. If additional nutrition issues arise, please re-consult RD.   Loistine Chance, RD, LDN, [place] Registered Dietitian II Certified Diabetes Care and Education Specialist Please refer to [facility] for RD and/or RD on-call/weekend/after hours pager

## 2020-03-27 ENCOUNTER — Encounter (HOSPITAL_COMMUNITY): Payer: Self-pay | Admitting: Cardiovascular Disease

## 2020-03-27 DIAGNOSIS — N289 Disorder of kidney and ureter, unspecified: Secondary | ICD-10-CM | POA: Diagnosis not present

## 2020-03-27 DIAGNOSIS — E8770 Fluid overload, unspecified: Secondary | ICD-10-CM | POA: Diagnosis not present

## 2020-03-27 LAB — GLUCOSE, CAPILLARY
Glucose-Capillary: 139 mg/dL — ABNORMAL HIGH (ref 70–99)
Glucose-Capillary: 180 mg/dL — ABNORMAL HIGH (ref 70–99)
Glucose-Capillary: 199 mg/dL — ABNORMAL HIGH (ref 70–99)
Glucose-Capillary: 155 mg/dL — ABNORMAL HIGH (ref 70–99)

## 2020-03-27 LAB — BASIC METABOLIC PANEL
Anion gap: 10 (ref 5–15)
BUN: 44 mg/dL — ABNORMAL HIGH (ref 8–23)
CO2: 19 mmol/L — ABNORMAL LOW (ref 22–32)
Calcium: 9 mg/dL (ref 8.9–10.3)
Chloride: 109 mmol/L (ref 98–111)
Creatinine, Ser: 6.15 mg/dL — ABNORMAL HIGH (ref 0.61–1.24)
GFR calc Af Amer: 10 mL/min — ABNORMAL LOW (ref >60)
GFR calc non Af Amer: 9 mL/min — ABNORMAL LOW (ref >60)
Glucose, Bld: 141 mg/dL — ABNORMAL HIGH (ref 70–99)
Potassium: 4.7 mmol/L (ref 3.5–5.1)
Sodium: 138 mmol/L (ref 135–145)

## 2020-03-27 MED ORDER — SODIUM CHLORIDE 0.9% FLUSH
3.0000 mL | Freq: Two times a day (BID) | INTRAVENOUS | Status: DC
  Administered 2020-03-27 – 2020-03-28 (×2): 3 mL via INTRAVENOUS

## 2020-03-27 MED ORDER — SODIUM CHLORIDE 0.9 % IV SOLN: INTRAVENOUS | Status: DC

## 2020-03-27 MED ORDER — SODIUM CHLORIDE 0.9 % IV SOLN: 250.0000 mL | INTRAVENOUS | Status: DC | PRN

## 2020-03-27 MED ORDER — SODIUM CHLORIDE 0.9% FLUSH: 3.0000 mL | INTRAVENOUS | Status: DC | PRN

## 2020-03-27 NOTE — Progress Notes (Addendum)
Progress Note  Patient Name: William Schroeder Date of Encounter: 03/27/2020  [facility] Cardiologist: Carlyle Dolly, MD  Patient Profile     59 y.o. male with a history of CKD stage V not yet on dialysis, hypertension, type 2 diabetes mellitus, and prior CVA who was admitted with NSTEMI and acute CHF after presenting with dyspnea.   Subjective   No acute overnight events. Breathing improved. No chest pain.  Inpatient Medications    Scheduled Meds: . aspirin  81 mg Oral Daily  . atorvastatin  80 mg Oral Daily  . heparin  5,000 Units Subcutaneous Q8H  . hydrALAZINE  50 mg Oral TID  . insulin aspart  0-6 Units Subcutaneous TID WC  . isosorbide mononitrate  15 mg Oral Daily  . metoprolol succinate  25 mg Oral Daily  . nicotine  14 mg Transdermal Daily  . sodium chloride flush  3 mL Intravenous Q12H  . sodium chloride flush  3 mL Intravenous Q12H   Continuous Infusions: . sodium chloride     PRN Meds: sodium chloride, acetaminophen, diazepam, ondansetron (ZOFRAN) IV, sodium chloride flush   Vital Signs    Vitals:   03/26/20 1718 03/26/20 2032 03/27/20 0402 03/27/20 0936  BP:  (!) 164/72 (!) 138/58 (!) 142/61  Pulse: 72 71 62 66  Resp:  16 15   Temp: 97.9 F (36.6 C) 98 F (36.7 C) (!) 97.5 F (36.4 C)   TempSrc: Oral Oral Oral   SpO2: 99% 100% 100%   Weight:   99.9 kg   Height:        Intake/Output Summary (Last 24 hours) at 03/27/2020 0955 Last data filed at 03/27/2020 0900 Gross per 24 hour  Intake 408.9 ml  Output 750 ml  Net -341.1 ml   Last 3 Weights 03/27/2020 03/26/2020 03/25/2020  Weight (lbs) 220 lb 5.2 oz 222 lb 3.6 oz 222 lb 3.6 oz  Weight (kg) 99.94 kg 100.8 kg 100.8 kg  Some encounter information is confidential and restricted. Go to Review Flowsheets activity to see all data.      Telemetry    Normal sinus rhythm with rates in the 60's to 70's. Some PVCs.  - Personally Reviewed  ECG    No  - Personally Reviewed  Physical Exam    GEN: No acute distress.   Neck: No JVD Cardiac: RRR. No significant murmurs, rubs, or gallops appreciated. Right femoral cath site soft with no signs of hematoma. Respiratory: No increased work of breathing. Clear to auscultation bilaterally. GI: Soft, non-tender, non-distended  MS: No lower extremity edema. No deformity. Neuro:  No focal deficits. Psych: Normal affect. Responds appropriately.   Labs    High Sensitivity Troponin:   Recent Labs  Lab 03/24/20 0615 03/24/20 1226  TROPONINIHS 7,090* 7,421*      Chemistry Recent Labs  Lab 03/24/20 0615 03/24/20 0615 03/25/20 0451 03/25/20 0451 03/26/20 0505 03/26/20 0505 03/26/20 1605 03/26/20 1607 03/26/20 1617  NA 138   < > 139   < > 138   < > 142 141 141  K 4.9   < > 4.5   < > 4.4   < > 4.6 4.6 4.6  CL 111  --  109  --  107  --   --   --   --   CO2 18*  --  17*  --  22  --   --   --   --   GLUCOSE 116*  --  125*  --  208*  --   --   --   --   BUN 43*  --  44*  --  43*  --   --   --   --   CREATININE 5.78*  --  5.90*  --  6.16*  --   --   --   --   CALCIUM 8.6*  --  8.7*  --  8.7*  --   --   --   --   GFRNONAA 10*  --  9*  --  9*  --   --   --   --   GFRAA 11*  --  11*  --  10*  --   --   --   --   ANIONGAP 9  --  13  --  9  --   --   --   --    < > = values in this interval not displayed.     Hematology Recent Labs  Lab 03/24/20 0615 03/24/20 0615 03/25/20 0451 03/25/20 0451 03/26/20 0505 03/26/20 0505 03/26/20 1605 03/26/20 1607 03/26/20 1617  WBC 8.3  --  7.9  --  7.3  --   --   --   --   RBC 3.11*  --  3.22*  --  3.21*  --   --   --   --   HGB 9.4*   < > 9.7*   < > 9.5*   < > 9.5* 9.5* 9.5*  HCT 29.7*   < > 30.6*   < > 29.7*   < > 28.0* 28.0* 28.0*  MCV 95.5  --  95.0  --  92.5  --   --   --   --   MCH 30.2  --  30.1  --  29.6  --   --   --   --   MCHC 31.6  --  31.7  --  32.0  --   --   --   --   RDW 13.5  --  13.3  --  13.2  --   --   --   --   PLT 186  --  169  --  194  --   --   --   --    <  > = values in this interval not displayed.    BNPNo results for input(s): BNP, PROBNP in the last 168 hours.   DDimer No results for input(s): DDIMER in the last 168 hours.   Radiology    CARDIAC CATHETERIZATION  Result Date: 03/26/2020  2nd Mrg-1 lesion is 20% stenosed.  2nd Mrg-2 lesion is 80% stenosed.  LPDA-1 lesion is 30% stenosed.  LPDA-2 lesion is 80% stenosed.  1st Diag lesion is 30% stenosed.  Mid LAD lesion is 30% stenosed.  Prox LAD lesion is 90% stenosed.  Coronary calcification with a 90% calcified proximal LAD stenosis, 30% first diagonal 30% LAD stenosis after the diagonal takeoff; dominant left circumflex coronary artery with calcification in 20 and 80% circumflex marginal stenoses, 30 and 80% PDA stenosis; small nondominant RCA. Mildly elevated right heart pressure with PA pressure at 34/19; mean PA pressure 24 mm. Contrast used: 17 cc RECOMMENDATION: We will need to monitor renal function post catheterization.  Patient will need intervention to his proximal LAD once stable from a renal standpoint.  Aggressive lipid-lowering therapy.    Cardiac Studies   Echocardiogram 03/25/2020: The left ventricle has mildly decreased function EF ~45%. The anteroseptal wall is  hypokinetic. The entire apex is hypokinetic. Mild LVH - indeterminate diastolic pressures.  Normal RV.  Normal valves.  Normal atrial size. _______________  Right/Left Cardiac Catheterization 03/26/2020:  2nd Mrg-1 lesion is 20% stenosed.  2nd Mrg-2 lesion is 80% stenosed.  LPDA-1 lesion is 30% stenosed.  LPDA-2 lesion is 80% stenosed.  1st Diag lesion is 30% stenosed.  Mid LAD lesion is 30% stenosed.  Prox LAD lesion is 90% stenosed.   Coronary calcification with a 90% calcified proximal LAD stenosis, 30% first diagonal 30% LAD stenosis after the diagonal takeoff; dominant left circumflex coronary artery with calcification in 20 and 80% circumflex marginal stenoses, 30 and 80% PDA stenosis; small  nondominant RCA.  Mildly elevated right heart pressure with PA pressure at 34/19; mean PA pressure 24 mm.  Contrast used: 17 cc  Recommendation: We will need to monitor renal function post catheterization.  Patient will need intervention to his proximal LAD once stable from a renal standpoint.  Aggressive lipid-lowering therapy.   Assessment & Plan    NSTEMI (presenting as acute CHF symptoms and not angina); CAD involving native coronary artery with unstable angina - High-sensitivity troponin peaked at 7,421. - Echo showed LVEF of 45% with hypokinesis of there anteroseptal wall with entire apex being hypokinetic. - R/LHC on 7/19 showed 90% calcified stenosis of proximal LAD, 30% stenosis followed by 80% stenosis of LPDA, 20% stenosis followed by 80% stenosis of 2nd Marginal. Mildly elevated right heart pressures with PA pressure of 34/19 and mean PA pressure of 33mmg.  - No angina.  - Continue Aspirin, beta-blocker, and high-intensity statin. Continue low dose Imdur 15mg  daily. - Plan is for coronary atherectomy of LAD lesion tomorrow because patient does not want CABG. Will place pre-cath orders and plan for 75cc/hr x4 hrs of IV hydration. The patient understands that risks include but are not limited to stroke (1 in 1000), death (1 in 56), kidney failure [usually temporary] (1 in 500), bleeding (1 in 200), allergic reaction [possibly serious] (1 in 200), and agrees to proceed.  --I personally talked with the patient about the risks, benefits, alternatives and indications of high risk atherectomy PCI.  High risk because of near ostial LAD lesion that is extremely calcified requiring atherectomy and the patient with CKD V.  He indicated the he would only want bypass surgery if he had no other choice.  Given the option between high risk PCI as described and open heart surgery, he clearly indicated that he wanted PCI.  Acute on Chronic Combined CHF- Appears euvolemic.  - Echo as above- >  relatively normal right heart cath pressures yesterday. - Initially started on IV Lasix. Last dose 03/25/2020. Net negative 3.9 L since admission. Weight down 2lbs from yesterday. - Continue Toprol-XL 25mg  daily. - No ACEi/ARB given renal function. - Continue to monitor daily weights, strict I/O's, and renal function.  Hypertension - BP mildly elevated.  - Continue Hydralazine 50mg  TID. Can increase if needed. - Continue Imdur as above.  CKD Stage V - Has been preparing for dialysis as outpatient with AV fistula in place but not quite ready to use.  - Creatinine stable at 6.15.  - Nephrology following.     Signed, Darreld Mclean, PA-C  03/27/2020, 9:55 AM    ATTENDING ATTESTATION  I have seen, examined and evaluated the patient this AM along with Sande Rives, PA.  After reviewing all the available data and chart, we discussed the patients laboratory, study & physical findings as well as  symptoms in detail. I agree with her findings, examination as well as impression recommendations as per our discussion.    Attending adjustments noted in italics.   Casmir is doing actually quite well.  He thought about potential options yesterday and is clear on his decision that he does not not want to have bypass surgery unless there is no other alternative.  I did spend time talking about the risks of both options.  He understands that the atherectomy based PCI risk is higher than the baseline diagnostic risk he has agreed to proceed with PCI.  Risks / Complications include, but not limited to: Death, MI, CVA/TIA, VF/VT (with defibrillation), Bradycardia (need for temporary pacer placement), contrast induced nephropathy  -with over 50% chance of requiring hemodialysis following the procedure, bleeding / bruising / hematoma / pseudoaneurysm, vascular or coronary injury (with possible emergent CT or Vascular Surgery), adverse medication reactions, infection.  Additional risks involving the use of  radiation with the possibility of radiation burns and cancer were explained in detail.    We are titrating medications for blood pressure control and antianginal benefit.  Will load with Brilinta at p.m. dosing interval.  I discussed the case with Dr.Kelly who will arrange the necessary equipment and device representatives for the procedure tomorrow.    Glenetta Hew, M.D., M.S. Interventional Cardiologist   Pager # [phone] Phone # [phone] [address] [place], [place] [address]    For questions or updates, please contact [place] Please consult [email or web address] for contact info under

## 2020-03-27 NOTE — Progress Notes (Signed)
Brief Nutrition Education Follow-Up Note  RD working remotely.  RD was consulted earlier this admission for diet education.   Per progress notes, RD attempted education yesterday, however, unable to reach. See progress note on 03/27/20 for further details.   RD attempted to speak with pt via phone today, however, no answer.   Handout has been attached to discharged instructions.   Per MD notes, plan for PCI to LAD with atherectomy on 03/28/20.    If additional nutrition issues arise, please re-consult RD.   Loistine Chance, RD, LDN, [place] Registered Dietitian II Certified Diabetes Care and Education Specialist Please refer to [facility] for RD and/or RD on-call/weekend/after hours pager

## 2020-03-27 NOTE — Progress Notes (Signed)
Admit: 03/23/2020 LOS: 4  26M progresive CKD5 with NSTEMI  Subjective:  Status post left heart cath yesterday which she tolerated.  Needs intervention on his proximal LAD (ideally needs a CABG but patient declined this and opted for high risk PCI).  He otherwise denies any fevers, chest pain, shortness of breath, orthopnea.  He does report that he is urinating less as compared to when he was on diuretics. 07/19 0701 - 07/20 0700 In: 974.9 [P.O.:360; I.V.:614.9] Out: 800 [Urine:800]  Filed Weights   03/25/20 0500 03/26/20 0500 03/27/20 0402  Weight: 100.8 kg 100.8 kg 99.9 kg    Scheduled Meds: . aspirin  81 mg Oral Daily  . atorvastatin  80 mg Oral Daily  . heparin  5,000 Units Subcutaneous Q8H  . hydrALAZINE  50 mg Oral TID  . insulin aspart  0-6 Units Subcutaneous TID WC  . isosorbide mononitrate  15 mg Oral Daily  . metoprolol succinate  25 mg Oral Daily  . nicotine  14 mg Transdermal Daily  . sodium chloride flush  3 mL Intravenous Q12H  . sodium chloride flush  3 mL Intravenous Q12H  . sodium chloride flush  3 mL Intravenous Q12H   Continuous Infusions: . sodium chloride    . sodium chloride    . [START ON 03/28/2020] sodium chloride     PRN Meds:.sodium chloride, sodium chloride, acetaminophen, diazepam, ondansetron (ZOFRAN) IV, sodium chloride flush, sodium chloride flush  Current Labs: reviewed  Results for William Schroeder, William Schroeder (MRN [id number]) as of 03/25/2020 10:20  Ref. Range 03/24/2020 06:15  Saturation Ratios Latest Ref Range: 17.9 - 39.5 % 36  Ferritin Latest Ref Range: 24 - 336 ng/mL 26    Physical Exam:  Blood pressure (!) 142/61, pulse 66, temperature (!) 97.5 F (36.4 C), temperature source Oral, resp. rate 15, height 5\' 10"  (1.778 m), weight 99.9 kg, SpO2 100 %. GEN: NAD. Lying flat in bed ENT: NCAT EYES: EOMI CV: RRR no rub PULM: cta bl, no w/r/r/c, unlabored ABD: s/nt/nd SKIN: no rashes/lesions; LUE AVF incision well healed EXT:no sig LEE LUE AVF  +B/T   Recent Labs  Lab 03/24/20 0615 03/24/20 1226 03/25/20 0451 03/25/20 0451 03/26/20 0505 03/26/20 1605 03/26/20 1607 03/26/20 1617 03/27/20 1004  NA   < >  --  139   < > 138   < > 141 141 138  K   < >  --  4.5   < > 4.4   < > 4.6 4.6 4.7  CL   < >  --  109  --  107  --   --   --  109  CO2   < >  --  17*  --  22  --   --   --  19*  GLUCOSE   < >  --  125*  --  208*  --   --   --  141*  BUN   < >  --  44*  --  43*  --   --   --  44*  CREATININE   < >  --  5.90*  --  6.16*  --   --   --  6.15*  CALCIUM   < >  --  8.7*  --  8.7*  --   --   --  9.0  PHOS  --  5.7*  --   --   --   --   --   --   --    < > =  values in this interval not displayed.   Recent Labs  Lab 03/24/20 0615 03/24/20 0615 03/25/20 0451 03/25/20 0451 03/26/20 0505 03/26/20 0505 03/26/20 1605 03/26/20 1607 03/26/20 1617  WBC 8.3  --  7.9  --  7.3  --   --   --   --   NEUTROABS 4.5  --   --   --   --   --   --   --   --   HGB 9.4*   < > 9.7*   < > 9.5*   < > 9.5* 9.5* 9.5*  HCT 29.7*   < > 30.6*   < > 29.7*   < > 28.0* 28.0* 28.0*  MCV 95.5  --  95.0  --  92.5  --   --   --   --   PLT 186  --  169  --  194  --   --   --   --    < > = values in this interval not displayed.      A 1. CKD5 followed by [facility] / [facility];  Appears is progressive, SCr 4.6 last May.  SOB / Hypoxia / Pulm Edema, improved 2. NSTEMI 3. LUE AVF +B/T, healing well 4. DM2 5. HTN 6. 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 Status post left heart cath on 7/19.  Open for intervention to his proximal LAD tomorrow which is more than acceptable at this juncture given risks and benefits.  Patient is agreeable to starting dialysis if needed. . Start diuresis if signs of hypervolemia. Goal is to maintain euvolemia pre and post contrast administration.  No indication to start diuretics at this junction. . We will monitor closely for renal replacement therapy needs.  If this is needed, then would need tunneled dialysis  catheter given that his left upper extremity AV fistula is not fully matured based on my examination . Hold on starting RASS blockade for now . Start sodium bicarbonate 650 mg twice daily . Renal diet . Daily weights, Daily Renal Panel, Strict I/Os, Avoid nephrotoxins (NSAIDs, judicious IV Contrast)     Gean Quint, MD [facility]

## 2020-03-27 NOTE — TOC Initial Note (Signed)
Transition of Care [facility]) - Initial/Assessment Note    Patient Details  Name: William Schroeder MRN: [id number] Date of Birth: 03-29-1961  Transition of Care [facility]) CM/SW Contact:    Bethena Roys, RN Phone Number: 03/27/2020, 12:00 PM  Clinical Narrative:   High risk for readmission assessment completed. Patient presented for shortness of breath. Patient is from home with support of daughter. Patient has primary care provider at the [facility]- Dr. Carlota Raspberry. The patient's daughter takes him to appointments. Patient uses mail order for prescriptions from the [facility]. If the patient has new medications they will need to be escribed to the [facility]. Patient states that he has been enrolled in the [facility] where an aide comes into the home 3 days a week. Case Manager received the heart failure referral consult. Patient does not feel that he needs a home health nurse at this time. Case Manager will continue to follow for transition of care needs.                Expected Discharge Plan: Home/Self Care Barriers to Discharge: Continued Medical Work up   Patient Goals and CMS Choice Patient states their goals for this hospitalization and ongoing recovery are:: to return home.   Choice offered to / list presented to : NA  Expected Discharge Plan and Services Expected Discharge Plan: Home/Self Care In-house Referral: NA Discharge Planning Services: CM Consult Post Acute Care Choice: NA Living arrangements for the past 2 months: Single Family Home Expected Discharge Date: 03/27/20               DME Arranged: N/A   HH Arranged: NA    Prior Living Arrangements/Services Living arrangements for the past 2 months: Single Family Home Lives with:: Self (has support of daughter.) Patient language and need for interpreter reviewed:: Yes Do you feel safe going back to the place where you live?: Yes      Need for Family Participation in Patient Care: Yes  (Comment) Care giver support system in place?: Yes (comment)   Criminal Activity/Legal Involvement Pertinent to Current Situation/Hospitalization: No - Comment as needed  Activities of Daily Living Home Assistive Devices/Equipment: Blood pressure cuff, CBG Meter, Dentures (specify type), Hand-held shower hose, Scales, Walker (specify type) ADL Screening (condition at time of admission) Patient's cognitive ability adequate to safely complete daily activities?: Yes Is the patient deaf or have difficulty hearing?: No Does the patient have difficulty seeing, even when wearing glasses/contacts?: No Does the patient have difficulty concentrating, remembering, or making decisions?: No Patient able to express need for assistance with ADLs?: Yes Does the patient have difficulty dressing or bathing?: No Independently performs ADLs?: Yes (appropriate for developmental age) Does the patient have difficulty walking or climbing stairs?: Yes Weakness of Legs: Both Weakness of Arms/Hands: None  Permission Sought/Granted Permission sought to share information with : Family Supports, Investment banker, corporate granted to share info w AGENCY: [facility]        Emotional Assessment Appearance:: Appears stated age Attitude/Demeanor/Rapport: Engaged Affect (typically observed): Appropriate Orientation: : Oriented to Situation, Oriented to  Time, Oriented to Self, Oriented to Place Alcohol / Substance Use: Not Applicable Psych Involvement: No (comment)  Admission diagnosis:  Hypervolemia associated with renal insufficiency [E87.70, N28.9] Patient Active Problem List   Diagnosis Date Noted  . CKD (chronic kidney disease), stage V ([place]) 03/23/2020  . Acute respiratory failure with hypoxia ([place]) 03/23/2020  . Non-STEMI (  non-ST elevated myocardial infarction) ([place]) 03/23/2020  . Hypervolemia associated with renal insufficiency 03/23/2020  . Hyperkalemia 03/23/2020  . Carpal  tunnel syndrome 10/18/2014  . Major depressive disorder, recurrent, severe without psychotic features ([place])   . Tobacco use disorder, moderate, dependence   . Major depressive disorder, single episode, severe without psychotic features ([place])   . Depression 07/06/2014  . Diabetes mellitus, type 2 ([place]) 07/06/2014  . Hypertension 07/06/2014  . Dehydration 04/12/2013  . Gastroparesis 04/12/2013  . Syncope 04/11/2013  . Diabetes mellitus ([place]) 04/11/2013  . Abdominal pain, epigastric 10/27/2012  . Barrett's esophagus 10/27/2012  . Early satiety 08/02/2012  . Anorexia 08/02/2012  . Epigastric pain 08/02/2012  . Bowel habit changes 08/02/2012  . Constipation 08/02/2012  . FH: pancreatic cancer 08/02/2012  . Chronic, continuous use of opioids 08/02/2012  . Chest pain 02/23/2001   PCP:  Clinic, [place]:   [facility] [address] [place] [address] Phone: [phone] Fax: [place], [place] - [place] [phone] [place] [address] Phone: [phone] Fax: [phone]  Readmission Risk Interventions Readmission Risk Prevention Plan 03/27/2020  Transportation Screening Complete  Medication Review ([place]) Complete  PCP or Specialist appointment within 3-5 days of discharge Complete  HRI or [place] Complete  SW Recovery Care/Counseling Consult Complete  [place] Not Applicable  Some recent data might be hidden

## 2020-03-27 NOTE — Progress Notes (Signed)
PROGRESS NOTE    William Schroeder  WSF:[id number] DOB: July 13, 1961 DOA: 03/23/2020 PCP: [facility]    Brief Narrative: HPI per Dr. Myna Hidalgo.  03/23/2020.  William Schroeder is a 59 y.o. male with medical history significant for insulin-dependent diabetes mellitus, history of CVA, hypertension, and chronic kidney disease stage V, now presenting to the emergency department for evaluation of shortness of breath.  Patient reports some chronic dyspnea and notes that he typically sleeps in a recliner because of shortness of breath and back pain when he tries to lay flat.  His shortness of breath was worse than usual on 03/22/2020, he tried to sleep been a bed that night, but developed severe worsening in his dyspnea and felt as though he cannot catch his breath even when he sat back up.  His ex-wife was with him, noted that he appeared pale and struggling to breathe, and EMS was called.  Patient denies any cough, denies fevers or chills, and has not experienced any chest discomfort or palpitations associated with this.  He denies any leg swelling but reports that his abdomen was swollen recently.  [facility] ED Course: Upon arrival to the ED, patient is found to be saturating in the mid 80s on room air, afebrile, and with stable blood pressure.  EKG featured a sinus rhythm with T wave abnormality.  Chest x-ray was concerning for pulmonary edema.  Chemistry panel was notable for creatinine of 5.37 and potassium 5.3.  proBNP was elevated to 30,000 and troponin elevated to 1884.  Nephrology and cardiology were consulted by the ED physician and admission to [facility] was recommended.  Patient was given 120 mg of IV Lasix in the ED with normalization and potassium, improvement in his oxygen requirement, subjective improvement in his shortness of breath, but his troponin continued to rise, reaching 6000 just prior to transfer.  On arrival to [facility], patient reports that his dyspnea is much improved, he  is able to lay back now, and continues to deny any chest discomfort.  03/24/2020 patient resting in bed on 5 L of oxygen.  He did not have oxygen prior to admission to hospital.  He feels his breathing is better after Lasix.  He lives at home with his daughter.  Apparently his daughter is also in the hospital. Denies any chest pain.  Assessment & Plan:   Principal Problem:   Hypervolemia associated with renal insufficiency Active Problems:   Diabetes mellitus, type 2 (HCC)   Hypertension   CKD (chronic kidney disease), stage V (HCC)   Acute respiratory failure with hypoxia (HCC)   Non-STEMI (non-ST elevated myocardial infarction) (HCC)   Hyperkalemia  #1 NSTEMI-patient presented with worsening shortness of breath dyspnea on exertion chest x-ray with pulmonary edema elevated BNP and increased elevated troponins. Cardiac cath 03/26/2020--Coronary calcification with a 90% calcified proximal LAD stenosis, 30% first diagonal 30% LAD stenosis after the diagonal takeoff; dominant left circumflex coronary artery with calcification in 20 and 80% circumflex marginal stenoses, 30 and 80% PDA stenosis; small nondominant RCA. Cardiology recommends to monitor renal functions post catheterization.  Patient will need intervention to his proximal LAD once nephrology clears.  Aggressive lipid-lowering therapy recommended. Cardiology tentatively planning for PCI to LAD on Wednesday with atherectomy. BMP daily.  Creatinine 6.15 same as yesterday. Defer to nephrology when to restart Lasix.  Right heart cath normal, appears euvolemic. Heparin stopped by cardiology.     #2 AKI on CKD stage V-creatinine remained stable 6.15 after cath 03/26/2020  monitor renal functions daily.  Defer to nephrology when to restart Lasix.   #3 history of type 2 diabetes-hemoglobin A1c of 7.2 continue SSI. CBG (last 3)  Recent Labs    03/26/20 1733 03/26/20 2053 03/27/20 0726  GLUCAP 143* 172* 139*    He was on Glucophage  and insulin prior to admission. Continue SSI for now.  #4 history of stroke on aspirin and statin.  #5 acute heart failure with pulmonary edema-patient received high dose Lasix now on hold for 24 hours prior to cath.  Patient had a cath 03/26/2020.  Will defer to nephrology when to restart Lasix.  Echocardiogram 03/24/2020 ejection fraction 45% anteroseptal wall is hypokinetic entire apex is hypokinetic left ventricle demonstrates regional wall motion abnormalities mild LVH.  Right ventricular systolic function normal. Cardiology to start Toprol.  #6 anemia of chronic renal disease likely monitor daily  #7 essential hypertension on Norvasc and lisinopril prior to admission.  On Toprol, hydralazine and Imdur.  #8 history of hyperlipidemia on Zocor  #9 tobacco abuse continue nicotine patch  Estimated body mass index is 31.61 kg/m as calculated from the following:   Height as of this encounter: 5\' 10"  (1.778 m).   Weight as of this encounter: 99.9 kg.  DVT prophylaxis: Heparin Code Status: Full code Family Communication: None at bedside  disposition Plan:  Status is: Inpatient  Dispo: The patient is from: Home              Anticipated d/c is MA:[id number]              Anticipated d/c date TG:[id number]              Patient currently is not medically stable to d/c.  Patient admitted with pulmonary edema new onset with CKD AKI and heart failure and nstemi Pending atherectomy and PCI to LAD.  Consultants:   Cardiology and nephrology  Procedures: None Antimicrobials: None  Subjective:  Patient is resting in bed he is awake and alert he denies any new complaints today no chest pain reported no nausea vomiting reported. Objective: Vitals:   03/26/20 1718 03/26/20 2032 03/27/20 0402 03/27/20 0936  BP:  (!) 164/72 (!) 138/58 (!) 142/61  Pulse: 72 71 62 66  Resp:  16 15   Temp: 97.9 F (36.6 C) 98 F (36.7 C) (!) 97.5 F (36.4 C)   TempSrc: Oral Oral Oral   SpO2: 99% 100% 100%     Weight:   99.9 kg   Height:        Intake/Output Summary (Last 24 hours) at 03/27/2020 1051 Last data filed at 03/27/2020 0900 Gross per 24 hour  Intake 408.9 ml  Output 750 ml  Net -341.1 ml   Filed Weights   03/25/20 0500 03/26/20 0500 03/27/20 0402  Weight: 100.8 kg 100.8 kg 99.9 kg    Examination:  General exam: Appears calm and comfortable  Respiratory system: Crackles bilateral bases to auscultation. Respiratory effort normal. Cardiovascular system: S1 & S2 heard, RRR. No JVD, murmurs, rubs, gallops or clicks. No pedal edema. Gastrointestinal system: Abdomen is nondistended, soft and nontender. No organomegaly or masses felt. Normal bowel sounds heard. Central nervous system: Alert and oriented. No focal neurological deficits. Extremities: 1+ pitting edema bilateral  skin: No rashes, lesions or ulcers Psychiatry: Judgement and insight appear normal. Mood & affect appropriate.     Data Reviewed: I have personally reviewed following labs and imaging studies  CBC: Recent Labs  Lab 03/24/20 0615 03/24/20 0615 03/25/20  0451 03/26/20 0505 03/26/20 1605 03/26/20 1607 03/26/20 1617  WBC 8.3  --  7.9 7.3  --   --   --   NEUTROABS 4.5  --   --   --   --   --   --   HGB 9.4*   < > 9.7* 9.5* 9.5* 9.5* 9.5*  HCT 29.7*   < > 30.6* 29.7* 28.0* 28.0* 28.0*  MCV 95.5  --  95.0 92.5  --   --   --   PLT 186  --  169 194  --   --   --    < > = values in this interval not displayed.   Basic Metabolic Panel: Recent Labs  Lab 03/24/20 0615 03/24/20 0615 03/24/20 1226 03/25/20 0451 03/26/20 0505 03/26/20 1605 03/26/20 1607 03/26/20 1617  NA 138   < >  --  139 138 142 141 141  K 4.9   < >  --  4.5 4.4 4.6 4.6 4.6  CL 111  --   --  109 107  --   --   --   CO2 18*  --   --  17* 22  --   --   --   GLUCOSE 116*  --   --  125* 208*  --   --   --   BUN 43*  --   --  44* 43*  --   --   --   CREATININE 5.78*  --   --  5.90* 6.16*  --   --   --   CALCIUM 8.6*  --   --  8.7*  8.7*  --   --   --   MG 2.2  --   --   --   --   --   --   --   PHOS  --   --  5.7*  --   --   --   --   --    < > = values in this interval not displayed.   GFR: Estimated Creatinine Clearance: 14.9 mL/min (A) (by C-G formula based on SCr of 6.16 mg/dL (H)). Liver Function Tests: No results for input(s): AST, ALT, ALKPHOS, BILITOT, PROT, ALBUMIN in the last 168 hours. No results for input(s): LIPASE, AMYLASE in the last 168 hours. No results for input(s): AMMONIA in the last 168 hours. Coagulation Profile: Recent Labs  Lab 03/24/20 1523  INR 1.4*   Cardiac Enzymes: No results for input(s): CKTOTAL, CKMB, CKMBINDEX, TROPONINI in the last 168 hours. BNP (last 3 results) No results for input(s): PROBNP in the last 8760 hours. HbA1C: No results for input(s): HGBA1C in the last 72 hours. CBG: Recent Labs  Lab 03/26/20 0645 03/26/20 1115 03/26/20 1733 03/26/20 2053 03/27/20 0726  GLUCAP 179* 163* 143* 172* 139*   Lipid Profile: Recent Labs    03/26/20 1255  CHOL 180  HDL 27*  LDLCALC 115*  TRIG 189*  CHOLHDL 6.7   Thyroid Function Tests: No results for input(s): TSH, T4TOTAL, FREET4, T3FREE, THYROIDAB in the last 72 hours. Anemia Panel: No results for input(s): VITAMINB12, FOLATE, FERRITIN, TIBC, IRON, RETICCTPCT in the last 72 hours. Sepsis Labs: No results for input(s): PROCALCITON, LATICACIDVEN in the last 168 hours.  No results found for this or any previous visit (from the past 240 hour(s)).       Radiology Studies: CARDIAC CATHETERIZATION  Result Date: 03/26/2020  2nd Mrg-1 lesion is 20% stenosed.  2nd Mrg-2 lesion is  80% stenosed.  LPDA-1 lesion is 30% stenosed.  LPDA-2 lesion is 80% stenosed.  1st Diag lesion is 30% stenosed.  Mid LAD lesion is 30% stenosed.  Prox LAD lesion is 90% stenosed.  Coronary calcification with a 90% calcified proximal LAD stenosis, 30% first diagonal 30% LAD stenosis after the diagonal takeoff; dominant left circumflex  coronary artery with calcification in 20 and 80% circumflex marginal stenoses, 30 and 80% PDA stenosis; small nondominant RCA. Mildly elevated right heart pressure with PA pressure at 34/19; mean PA pressure 24 mm. Contrast used: 17 cc RECOMMENDATION: We will need to monitor renal function post catheterization.  Patient will need intervention to his proximal LAD once stable from a renal standpoint.  Aggressive lipid-lowering therapy.        Scheduled Meds: . aspirin  81 mg Oral Daily  . atorvastatin  80 mg Oral Daily  . heparin  5,000 Units Subcutaneous Q8H  . hydrALAZINE  50 mg Oral TID  . insulin aspart  0-6 Units Subcutaneous TID WC  . isosorbide mononitrate  15 mg Oral Daily  . metoprolol succinate  25 mg Oral Daily  . nicotine  14 mg Transdermal Daily  . sodium chloride flush  3 mL Intravenous Q12H  . sodium chloride flush  3 mL Intravenous Q12H   Continuous Infusions: . sodium chloride       LOS: 4 days     Georgette Shell, MD  03/27/2020, 10:51 AM

## 2020-03-28 ENCOUNTER — Inpatient Hospital Stay (HOSPITAL_COMMUNITY)
Admission: AD | Disposition: A | Discharge status: Skilled Nursing Facility | Payer: Self-pay | Source: Other Acute Inpatient Hospital | Attending: Internal Medicine

## 2020-03-28 DIAGNOSIS — E875 Hyperkalemia: Secondary | ICD-10-CM

## 2020-03-28 DIAGNOSIS — E8770 Fluid overload, unspecified: Secondary | ICD-10-CM | POA: Diagnosis not present

## 2020-03-28 HISTORY — PX: CORONARY ATHERECTOMY: CATH118238

## 2020-03-28 LAB — POCT ACTIVATED CLOTTING TIME
Activated Clotting Time: 312 seconds
Activated Clotting Time: 335 seconds
Activated Clotting Time: 208 seconds
Activated Clotting Time: 257 seconds
Activated Clotting Time: 263 seconds
Activated Clotting Time: 197 seconds
Activated Clotting Time: 175 seconds

## 2020-03-28 LAB — BASIC METABOLIC PANEL
Anion gap: 11 (ref 5–15)
BUN: 49 mg/dL — ABNORMAL HIGH (ref 8–23)
CO2: 20 mmol/L — ABNORMAL LOW (ref 22–32)
Calcium: 9 mg/dL (ref 8.9–10.3)
Chloride: 107 mmol/L (ref 98–111)
Creatinine, Ser: 6.27 mg/dL — ABNORMAL HIGH (ref 0.61–1.24)
GFR calc Af Amer: 10 mL/min — ABNORMAL LOW (ref >60)
GFR calc non Af Amer: 9 mL/min — ABNORMAL LOW (ref >60)
Glucose, Bld: 155 mg/dL — ABNORMAL HIGH (ref 70–99)
Potassium: 4.5 mmol/L (ref 3.5–5.1)
Sodium: 138 mmol/L (ref 135–145)

## 2020-03-28 LAB — CBC
HCT: 26.7 % — ABNORMAL LOW (ref 39.0–52.0)
Hemoglobin: 8.5 g/dL — ABNORMAL LOW (ref 13.0–17.0)
MCH: 29.6 pg (ref 26.0–34.0)
MCHC: 31.8 g/dL (ref 30.0–36.0)
MCV: 93 fL (ref 80.0–100.0)
Platelets: 196 10*3/uL (ref 150–400)
RBC: 2.87 MIL/uL — ABNORMAL LOW (ref 4.22–5.81)
RDW: 13.3 % (ref 11.5–15.5)
WBC: 7.2 10*3/uL (ref 4.0–10.5)
nRBC: 0 % (ref 0.0–0.2)

## 2020-03-28 LAB — GLUCOSE, CAPILLARY
Glucose-Capillary: 134 mg/dL — ABNORMAL HIGH (ref 70–99)
Glucose-Capillary: 120 mg/dL — ABNORMAL HIGH (ref 70–99)

## 2020-03-28 SURGERY — CORONARY ATHERECTOMY
Anesthesia: LOCAL

## 2020-03-28 MED ORDER — ATORVASTATIN CALCIUM 80 MG PO TABS
80.0000 mg | ORAL_TABLET | Freq: Every day | ORAL | Status: DC
  Administered 2020-03-29 – 2020-04-06 (×9): 80 mg via ORAL
  Filled 2020-03-28 (×9): qty 1

## 2020-03-28 MED ORDER — NITROGLYCERIN 1 MG/10 ML FOR IR/CATH LAB
INTRA_ARTERIAL | Status: DC | PRN
  Administered 2020-03-28 (×4): 200 ug via INTRACORONARY

## 2020-03-28 MED ORDER — HEPARIN (PORCINE) IN NACL 1000-0.9 UT/500ML-% IV SOLN
INTRAVENOUS | Status: DC | PRN
  Administered 2020-03-28 (×2): 500 mL

## 2020-03-28 MED ORDER — NITROGLYCERIN 1 MG/10 ML FOR IR/CATH LAB
INTRA_ARTERIAL | Status: AC
  Filled 2020-03-28: qty 10

## 2020-03-28 MED ORDER — FENTANYL CITRATE (PF) 100 MCG/2ML IJ SOLN
INTRAMUSCULAR | Status: DC | PRN
  Administered 2020-03-28: 50 ug via INTRAVENOUS
  Administered 2020-03-28 (×2): 25 ug via INTRAVENOUS

## 2020-03-28 MED ORDER — HEPARIN SODIUM (PORCINE) 5000 UNIT/ML IJ SOLN
5000.0000 [IU] | Freq: Three times a day (TID) | INTRAMUSCULAR | Status: DC
  Administered 2020-03-29 – 2020-04-06 (×19): 5000 [IU] via SUBCUTANEOUS
  Filled 2020-03-28 (×20): qty 1

## 2020-03-28 MED ORDER — HEPARIN SODIUM (PORCINE) 1000 UNIT/ML IJ SOLN
INTRAMUSCULAR | Status: DC | PRN
  Administered 2020-03-28: 2000 [IU] via INTRAVENOUS

## 2020-03-28 MED ORDER — LIDOCAINE HCL (PF) 1 % IJ SOLN
INTRAMUSCULAR | Status: AC
  Filled 2020-03-28: qty 30

## 2020-03-28 MED ORDER — HEPARIN SODIUM (PORCINE) 1000 UNIT/ML IJ SOLN
INTRAMUSCULAR | Status: AC
  Filled 2020-03-28: qty 1

## 2020-03-28 MED ORDER — SODIUM BICARBONATE 650 MG PO TABS
650.0000 mg | ORAL_TABLET | Freq: Two times a day (BID) | ORAL | Status: DC
  Administered 2020-03-28 – 2020-03-29 (×2): 650 mg via ORAL
  Filled 2020-03-28 (×2): qty 1

## 2020-03-28 MED ORDER — ACETAMINOPHEN 325 MG PO TABS: 650.0000 mg | ORAL_TABLET | ORAL | Status: DC | PRN

## 2020-03-28 MED ORDER — MIDAZOLAM HCL 2 MG/2ML IJ SOLN
INTRAMUSCULAR | Status: DC | PRN
  Administered 2020-03-28 (×2): 1 mg via INTRAVENOUS
  Administered 2020-03-28: 2 mg via INTRAVENOUS

## 2020-03-28 MED ORDER — LABETALOL HCL 5 MG/ML IV SOLN: 10.0000 mg | INTRAVENOUS | Status: AC | PRN

## 2020-03-28 MED ORDER — ASPIRIN 81 MG PO CHEW: 81.0000 mg | CHEWABLE_TABLET | Freq: Every day | ORAL | Status: DC

## 2020-03-28 MED ORDER — ONDANSETRON HCL 4 MG/2ML IJ SOLN
4.0000 mg | Freq: Four times a day (QID) | INTRAMUSCULAR | Status: DC | PRN
  Administered 2020-03-29 – 2020-04-01 (×3): 4 mg via INTRAVENOUS
  Filled 2020-03-28 (×3): qty 2

## 2020-03-28 MED ORDER — MIDAZOLAM HCL 2 MG/2ML IJ SOLN
INTRAMUSCULAR | Status: AC
  Filled 2020-03-28: qty 2

## 2020-03-28 MED ORDER — SODIUM CHLORIDE 0.9% FLUSH
3.0000 mL | Freq: Two times a day (BID) | INTRAVENOUS | Status: DC
  Administered 2020-03-29 – 2020-04-05 (×11): 3 mL via INTRAVENOUS

## 2020-03-28 MED ORDER — HYDRALAZINE HCL 20 MG/ML IJ SOLN: 10.0000 mg | INTRAMUSCULAR | Status: AC | PRN

## 2020-03-28 MED ORDER — FENTANYL CITRATE (PF) 100 MCG/2ML IJ SOLN
INTRAMUSCULAR | Status: AC
  Filled 2020-03-28: qty 2

## 2020-03-28 MED ORDER — SODIUM CHLORIDE 0.9 % IV SOLN: INTRAVENOUS | Status: AC

## 2020-03-28 MED ORDER — CLOPIDOGREL BISULFATE 75 MG PO TABS
600.0000 mg | ORAL_TABLET | Freq: Once | ORAL | Status: AC
  Administered 2020-03-28: 600 mg via ORAL
  Filled 2020-03-28: qty 8

## 2020-03-28 MED ORDER — IOHEXOL 350 MG/ML SOLN
INTRAVENOUS | Status: DC | PRN
  Administered 2020-03-28: 130 mL

## 2020-03-28 MED ORDER — HEPARIN (PORCINE) IN NACL 1000-0.9 UT/500ML-% IV SOLN
INTRAVENOUS | Status: AC
  Filled 2020-03-28: qty 500

## 2020-03-28 MED ORDER — SODIUM CHLORIDE 0.9% FLUSH: 3.0000 mL | INTRAVENOUS | Status: DC | PRN

## 2020-03-28 MED ORDER — VIPERSLIDE LUBRICANT OPTIME: TOPICAL | Status: DC | PRN

## 2020-03-28 MED ORDER — LIDOCAINE HCL (PF) 1 % IJ SOLN
INTRAMUSCULAR | Status: DC | PRN
  Administered 2020-03-28: 15 mL

## 2020-03-28 MED ORDER — CLOPIDOGREL BISULFATE 75 MG PO TABS
75.0000 mg | ORAL_TABLET | Freq: Every day | ORAL | Status: DC
  Administered 2020-03-29 – 2020-04-06 (×9): 75 mg via ORAL
  Filled 2020-03-28 (×9): qty 1

## 2020-03-28 MED ORDER — SODIUM CHLORIDE 0.9 % IV SOLN: 250.0000 mL | INTRAVENOUS | Status: DC | PRN

## 2020-03-28 MED ORDER — DIAZEPAM 5 MG PO TABS
5.0000 mg | ORAL_TABLET | Freq: Four times a day (QID) | ORAL | Status: DC | PRN
  Administered 2020-03-29 – 2020-04-02 (×5): 5 mg via ORAL
  Filled 2020-03-28 (×5): qty 1

## 2020-03-28 MED ORDER — HEPARIN SODIUM (PORCINE) 1000 UNIT/ML IJ SOLN
INTRAMUSCULAR | Status: DC | PRN
  Administered 2020-03-28: 4000 [IU] via INTRAVENOUS
  Administered 2020-03-28: 10000 [IU] via INTRAVENOUS

## 2020-03-28 SURGICAL SUPPLY — 18 items
BALLN SAPPHIRE 2.5X12 (BALLOONS) ×2
BALLN SAPPHIRE ~~LOC~~ 3.0X10 (BALLOONS) ×1 IMPLANT
BALLOON SAPPHIRE 2.5X12 (BALLOONS) IMPLANT
CATH TELEPORT (CATHETERS) ×1 IMPLANT
CATH VISTA GUIDE 6FR XBLAD3.5 (CATHETERS) ×1 IMPLANT
CROWN DIAMONDBACK CLASSIC 1.25 (BURR) ×1 IMPLANT
ELECT DEFIB PAD ADLT CADENCE (PAD) ×1 IMPLANT
KIT HEART LEFT (KITS) ×2 IMPLANT
LUBRICANT VIPERSLIDE CORONARY (MISCELLANEOUS) ×1 IMPLANT
PACK CARDIAC CATHETERIZATION (CUSTOM PROCEDURE TRAY) ×2 IMPLANT
SHEATH PINNACLE 6F 10CM (SHEATH) ×1 IMPLANT
SHEATH PROBE COVER 6X72 (BAG) ×1 IMPLANT
STENT RESOLUTE ONYX 2.75X15 (Permanent Stent) ×1 IMPLANT
TRANSDUCER W/STOPCOCK (MISCELLANEOUS) ×2 IMPLANT
TUBING CIL FLEX 10 FLL-RA (TUBING) ×2 IMPLANT
WIRE EMERALD 3MM-J .035X150CM (WIRE) ×1 IMPLANT
WIRE HI TORQ WHISPER MS 300CM (WIRE) ×1 IMPLANT
WIRE VIPERWIRE COR FLEX .012 (WIRE) ×1 IMPLANT

## 2020-03-28 NOTE — H&P (View-Only) (Signed)
Progress Note  Patient Name: William Schroeder Date of Encounter: 03/28/2020  [facility] Cardiologist: Carlyle Dolly, MD  Patient Profile     59 y.o. male with a history of CKD stage V not yet on dialysis, hypertension, type 2 diabetes mellitus, and prior CVA who was admitted with NSTEMI and acute CHF after presenting with dyspnea.   Subjective   No acute overnight events. Breathing improved. No chest pain.  Inpatient Medications    Scheduled Meds: . aspirin  81 mg Oral Daily  . atorvastatin  80 mg Oral Daily  . heparin  5,000 Units Subcutaneous Q8H  . hydrALAZINE  50 mg Oral TID  . insulin aspart  0-6 Units Subcutaneous TID WC  . isosorbide mononitrate  15 mg Oral Daily  . metoprolol succinate  25 mg Oral Daily  . nicotine  14 mg Transdermal Daily  . sodium chloride flush  3 mL Intravenous Q12H  . sodium chloride flush  3 mL Intravenous Q12H  . sodium chloride flush  3 mL Intravenous Q12H   Continuous Infusions: . sodium chloride    . sodium chloride    . sodium chloride 75 mL/hr at 03/28/20 0620   PRN Meds: sodium chloride, sodium chloride, acetaminophen, diazepam, ondansetron (ZOFRAN) IV, sodium chloride flush, sodium chloride flush   Vital Signs    Vitals:   03/27/20 1637 03/27/20 1903 03/28/20 0403 03/28/20 0851  BP: (!) 150/67 (!) 144/62 (!) 130/53 124/60  Pulse:  64 64   Resp:  15 16   Temp:  98.2 F (36.8 C) 98.1 F (36.7 C)   TempSrc:  Oral Oral   SpO2:  99% 99%   Weight:   98.7 kg   Height:        Intake/Output Summary (Last 24 hours) at 03/28/2020 1007 Last data filed at 03/28/2020 0900 Gross per 24 hour  Intake 1260 ml  Output 800 ml  Net 460 ml   Last 3 Weights 03/28/2020 03/27/2020 03/26/2020  Weight (lbs) 217 lb 10.2 oz 220 lb 5.2 oz 222 lb 3.6 oz  Weight (kg) 98.72 kg 99.94 kg 100.8 kg  Some encounter information is confidential and restricted. Go to Review Flowsheets activity to see all data.      Telemetry    Normal sinus rhythm  with rates in the 60's to 70's. Some PVCs.  - Personally Reviewed  ECG    No  - Personally Reviewed  Physical Exam   GEN: No acute distress.   Neck: No JVD Cardiac: RRR. No significant murmurs, rubs, or gallops appreciated. Right femoral cath site soft with no signs of hematoma. Respiratory: No increased work of breathing. Clear to auscultation bilaterally. GI: Soft, non-tender, non-distended  MS: No lower extremity edema. No deformity. Neuro:  No focal deficits. Psych: Normal affect. Responds appropriately.   Labs    High Sensitivity Troponin:   Recent Labs  Lab 03/24/20 0615 03/24/20 1226  TROPONINIHS 7,090* 7,421*      Chemistry Recent Labs  Lab 03/26/20 0505 03/26/20 1605 03/26/20 1617 03/27/20 1004 03/28/20 0520  NA 138   < > 141 138 138  K 4.4   < > 4.6 4.7 4.5  CL 107  --   --  109 107  CO2 22  --   --  19* 20*  GLUCOSE 208*  --   --  141* 155*  BUN 43*  --   --  44* 49*  CREATININE 6.16*  --   --  6.15* 6.27*  CALCIUM 8.7*  --   --  9.0 9.0  GFRNONAA 9*  --   --  9* 9*  GFRAA 10*  --   --  10* 10*  ANIONGAP 9  --   --  10 11   < > = values in this interval not displayed.     Hematology Recent Labs  Lab 03/25/20 0451 03/25/20 0451 03/26/20 0505 03/26/20 1605 03/26/20 1607 03/26/20 1617 03/28/20 0520  WBC 7.9  --  7.3  --   --   --  7.2  RBC 3.22*  --  3.21*  --   --   --  2.87*  HGB 9.7*   < > 9.5*   < > 9.5* 9.5* 8.5*  HCT 30.6*   < > 29.7*   < > 28.0* 28.0* 26.7*  MCV 95.0  --  92.5  --   --   --  93.0  MCH 30.1  --  29.6  --   --   --  29.6  MCHC 31.7  --  32.0  --   --   --  31.8  RDW 13.3  --  13.2  --   --   --  13.3  PLT 169  --  194  --   --   --  196   < > = values in this interval not displayed.    BNPNo results for input(s): BNP, PROBNP in the last 168 hours.   DDimer No results for input(s): DDIMER in the last 168 hours.   Radiology    CARDIAC CATHETERIZATION  Result Date: 03/26/2020  2nd Mrg-1 lesion is 20% stenosed.   2nd Mrg-2 lesion is 80% stenosed.  LPDA-1 lesion is 30% stenosed.  LPDA-2 lesion is 80% stenosed.  1st Diag lesion is 30% stenosed.  Mid LAD lesion is 30% stenosed.  Prox LAD lesion is 90% stenosed.  Coronary calcification with a 90% calcified proximal LAD stenosis, 30% first diagonal 30% LAD stenosis after the diagonal takeoff; dominant left circumflex coronary artery with calcification in 20 and 80% circumflex marginal stenoses, 30 and 80% PDA stenosis; small nondominant RCA. Mildly elevated right heart pressure with PA pressure at 34/19; mean PA pressure 24 mm. Contrast used: 17 cc RECOMMENDATION: We will need to monitor renal function post catheterization.  Patient will need intervention to his proximal LAD once stable from a renal standpoint.  Aggressive lipid-lowering therapy.    Cardiac Studies   Echocardiogram 03/25/2020: The left ventricle has mildly decreased function EF ~45%. The anteroseptal wall is hypokinetic. The entire apex is hypokinetic. Mild LVH - indeterminate diastolic pressures.  Normal RV.  Normal valves.  Normal atrial size. _______________  Right/Left Cardiac Catheterization 03/26/2020:  2nd Mrg-1 lesion is 20% stenosed.  2nd Mrg-2 lesion is 80% stenosed.  LPDA-1 lesion is 30% stenosed.  LPDA-2 lesion is 80% stenosed.  1st Diag lesion is 30% stenosed.  Mid LAD lesion is 30% stenosed.  Prox LAD lesion is 90% stenosed.   Coronary calcification with a 90% calcified proximal LAD stenosis, 30% first diagonal 30% LAD stenosis after the diagonal takeoff; dominant left circumflex coronary artery with calcification in 20 and 80% circumflex marginal stenoses, 30 and 80% PDA stenosis; small nondominant RCA.  Mildly elevated right heart pressure with PA pressure at 34/19; mean PA pressure 24 mm.  Contrast used: 17 cc  Recommendation: We will need to monitor renal function post catheterization.  Patient will need intervention to his proximal LAD once stable from a  renal standpoint.  Aggressive lipid-lowering therapy.   Assessment & Plan    NSTEMI (presenting as acute CHF symptoms and not angina); CAD involving native coronary artery with unstable angina/ACS - High-sensitivity troponin peaked at 7,421. - Echo showed LVEF of 45% with hypokinesis of there anteroseptal wall with entire apex being hypokinetic. - R/LHC on 7/19 showed 90% calcified stenosis of proximal LAD, 30% stenosis followed by 80% stenosis of LPDA, 20% stenosis followed by 80% stenosis of 2nd Marginal. Mildly elevated right heart pressures with PA pressure of 34/19 and mean PA pressure of 2mmg.  - No angina.  - Continue Aspirin, beta-blocker, and high-intensity statin. Continue low dose Imdur 15mg  daily. - Plan is for coronary atherectomy of LAD lesion today --patient voiced desire to avoid CABG.   --Precath hydration written, consent obtained yesterday.  High risk consent noted.    Acute on Chronic Combined CHF- Appears euvolemic.  EF 45% with anterior wall motion normality hopefully this will be helped with LAD reperfusion. - Echo as above- > relatively normal right heart cath pressures . -Still seems euvolemic. - Initially started on IV Lasix. Last dose 03/25/2020. Net negative 3.1 L since admission. Weight down another 3 pounds despite not diuresing.  I suspect this is stabilization of weights. -On Toprol 25 mg daily. -Clearly not on ACE inhibitor or ARB due to renal insufficiency.. -Follow ins and outs.  Does not seem to be having any acute acute dyspnea.  Holding Lasix now.  Defer to nephrology.  Hypertension -BP stable - Continue Hydralazine 50mg  TID. Can increase if needed -will wait till post cath, would like to allow for some renal perfusion pressure. - Continue Imdur as above.  CKD Stage V -Patient is pending initiation of outpatient hemodialysis, has fistula in place, however likely require dialysis line if he starts dialysis during this hospitalization. - Creatinine  essentially stable at 6.27 - Nephrology following.  Anemia--baseline hemoglobin 9.5 now 8.5, this is probably related to stopping diuretic and hydration.  No sign of bleeding.  Hemodynamically stable.  I suspect this is also at baseline related to anemia from CKD.     Patient is in route to the Cath Lab now for plan CSI PCI with Dr. Claiborne Billings.  Signed, Glenetta Hew, MD  03/28/2020, 10:07 AM      For questions or updates, please contact [place] Please consult [email or web address] for contact info under

## 2020-03-28 NOTE — Progress Notes (Signed)
Admit: 03/23/2020 LOS: 5  34M progresive CKD5 with NSTEMI  Subjective:  Status post prox LAD des today. Tolerated procedure well with stenosis reduced to 0% and brisk timi-3 flow. Currently feels well, denies chest pain, orthopnea, SOB, swelling, n/v. 07/20 0701 - 07/21 0700 In: 1620 [P.O.:1620] Out: 550 [Urine:550]  Filed Weights   03/26/20 0500 03/27/20 0402 03/28/20 0403  Weight: 100.8 kg 99.9 kg 98.7 kg    Scheduled Meds: . aspirin  81 mg Oral Daily  . [START ON 03/29/2020] atorvastatin  80 mg Oral Daily  . [START ON 03/29/2020] clopidogrel  75 mg Oral Q breakfast  . [START ON 03/29/2020] heparin  5,000 Units Subcutaneous Q8H  . hydrALAZINE  50 mg Oral TID  . insulin aspart  0-6 Units Subcutaneous TID WC  . isosorbide mononitrate  15 mg Oral Daily  . metoprolol succinate  25 mg Oral Daily  . nicotine  14 mg Transdermal Daily  . sodium chloride flush  3 mL Intravenous Q12H  . sodium chloride flush  3 mL Intravenous Q12H   Continuous Infusions: . sodium chloride    . sodium chloride 50 mL/hr at 03/28/20 1515  . sodium chloride     PRN Meds:.sodium chloride, sodium chloride, acetaminophen, diazepam, hydrALAZINE, labetalol, ondansetron (ZOFRAN) IV, sodium chloride flush, sodium chloride flush  Current Labs: reviewed  Results for CULLIN, DISHMAN (MRN [id number]) as of 03/25/2020 10:20  Ref. Range 03/24/2020 06:15  Saturation Ratios Latest Ref Range: 17.9 - 39.5 % 36  Ferritin Latest Ref Range: 24 - 336 ng/mL 26    Physical Exam:  Blood pressure (!) 159/76, pulse 73, temperature 97.6 F (36.4 C), temperature source Oral, resp. rate 13, height 5\' 10"  (1.778 m), weight 98.7 kg, SpO2 96 %. GEN: NAD. Lying flat in bed ENT: NCAT EYES: EOMI CV: RRR no rub PULM: cta bl, no w/r/r/c, unlabored ABD: s/nt/nd SKIN: no rashes/lesions; LUE AVF incision well healed EXT:no sig LEE LUE AVF +B/T   Recent Labs  Lab 03/24/20 1226 03/25/20 0451 03/26/20 0505 03/26/20 1605  03/26/20 1617 03/27/20 1004 03/28/20 0520  NA  --    < > 138   < > 141 138 138  K  --    < > 4.4   < > 4.6 4.7 4.5  CL  --    < > 107  --   --  109 107  CO2  --    < > 22  --   --  19* 20*  GLUCOSE  --    < > 208*  --   --  141* 155*  BUN  --    < > 43*  --   --  44* 49*  CREATININE  --    < > 6.16*  --   --  6.15* 6.27*  CALCIUM  --    < > 8.7*  --   --  9.0 9.0  PHOS 5.7*  --   --   --   --   --   --    < > = values in this interval not displayed.   Recent Labs  Lab 03/24/20 0615 03/24/20 0615 03/25/20 0451 03/25/20 0451 03/26/20 0505 03/26/20 1605 03/26/20 1607 03/26/20 1617 03/28/20 0520  WBC 8.3   < > 7.9  --  7.3  --   --   --  7.2  NEUTROABS 4.5  --   --   --   --   --   --   --   --  HGB 9.4*   < > 9.7*   < > 9.5*   < > 9.5* 9.5* 8.5*  HCT 29.7*   < > 30.6*   < > 29.7*   < > 28.0* 28.0* 26.7*  MCV 95.5   < > 95.0  --  92.5  --   --   --  93.0  PLT 186   < > 169  --  194  --   --   --  196   < > = values in this interval not displayed.      A 1. CKD5 followed by [facility] / [facility];  Appears is progressive, SCr 4.6 last May.   2. SOB / Hypoxia / Pulm Edema, improved 3. NSTEMI s/p LAD DES 03/28/20 (197mL contrast utilized) 4. LUE AVF +B/T, healing well 5. DM2 6. HTN 7. 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 Metabolic acidosis secondary to #1  P . Status post lad stent stent today. . Start diuresis if signs of hypervolemia. Goal is to maintain euvolemia pre and post contrast administration.  No indication to start diuretics at this junction, agree with gentle post procedure hydration . We will monitor closely for renal replacement therapy needs.  If this is needed, then would need tunneled dialysis catheter given that his left upper extremity AV fistula is not fully matured based on my examination (typically injury occurs 24-48hrs post contrast). Based on mehran score, he is around 13 points (18 points if chf included) which placed him around 1.09%  risk of post-CIN requiring dialysis (12.6% if chf included) . Hold on starting RASS blockade for now . Starting sodium bicarbonate 650 mg twice daily . Renal diet . Daily weights, Daily Renal Panel, Strict I/Os, Avoid nephrotoxins (NSAIDs, judicious IV Contrast)     Gean Quint, MD [facility]

## 2020-03-28 NOTE — Progress Notes (Signed)
6 French sheath removed from right femoral artery at 1815. Manual pressure held for 20 minutes. Gauze and tegaderm dressing applied to site. Right DP pulse palpable pre and post sheath pull. Bedrest begins at 1835 x 4 hours. Instructions given to patient, call bell within reach.

## 2020-03-28 NOTE — Interval H&P Note (Signed)
Cath Lab Visit (complete for each Cath Lab visit)  Clinical Evaluation Leading to the Procedure:   ACS: Yes.    Non-ACS:    Anginal Classification: CCS III  Anti-ischemic medical therapy: Maximal Therapy (2 or more classes of medications)  Non-Invasive Test Results: No non-invasive testing performed  Prior CABG: No previous CABG      History and Physical Interval Note:  03/28/2020 12:25 PM  William Schroeder  has presented today for surgery, with the diagnosis of cad.  The various methods of treatment have been discussed with the patient and family. After consideration of risks, benefits and other options for treatment, the patient has consented to  Procedure(s): CORONARY ATHERECTOMY (N/A) as a surgical intervention.  The patient's history has been reviewed, patient examined, no change in status, stable for surgery.  I have reviewed the patient's chart and labs.  Questions were answered to the patient's satisfaction.     Shelva Majestic

## 2020-03-28 NOTE — Progress Notes (Signed)
PROGRESS NOTE    William Schroeder  UEK:[id number] DOB: 06-01-1961 DOA: 03/23/2020 PCP: [facility]   Brief Narrative:   William Copley Danielsis a 59 y.o.malewith medical history significant forinsulin-dependent diabetes mellitus, history of CVA, hypertension, and chronic kidney disease stage V, now presenting to the emergency department for evaluation of shortness of breath. Patient reports some chronic dyspnea and notes that he typically sleeps in a recliner because of shortness of breath and back pain when he tries to lay flat.His shortness of breath was worse than usual on 03/22/2020, he tried to sleep been a bed that night, but developed severe worsening in his dyspnea and felt as though he cannot catch his breath even when he sat back up. His ex-wife was with him, noted that he appeared pale and struggling to breathe, and EMS was called. Patient denies any cough, denies fevers or chills, and has not experienced any chest discomfort or palpitations associated with this. He denies any leg swelling but reports that his abdomen was swollen recently.  7/21: s/p PCI. Follow up Scr. Denies complaints. Continue current Tx.    Assessment & Plan:   Principal Problem:   Hypervolemia associated with renal insufficiency Active Problems:   Diabetes mellitus, type 2 (HCC)   Hypertension   CKD (chronic kidney disease), stage V (HCC)   Acute respiratory failure with hypoxia (HCC)   Non-STEMI (non-ST elevated myocardial infarction) (HCC)   Hyperkalemia  NSTEMI     - patient presented with worsening shortness of breath dyspnea on exertion chest x-ray with pulmonary edema elevated BNP and increased elevated troponins.     - LHC 03/26/2020: Coronary calcification with a 90% calcified proximal LAD stenosis, 30% first diagonal 30% LAD stenosis after the diagonal takeoff; dominant left circumflex coronary artery with calcification in 20 and 80% circumflex marginal stenoses, 30 and 80% PDA stenosis;  small nondominant RCA.     - s/p PCI to LAD with atherectomy.     - appreciate cardiology assistance     - ASA/plavix/statin  AKI on CKD stage V     - follow up SCr; nephro onboard, appreciate assistance     - AM labs ordered  Hx of type 2 diabetes     - A1c: 7.2     - SSI , DM diet  Hx of stroke      - on aspirin and statin.  acute heart failure with pulmonary edema     - Patient received high dose Lasix now on hold for 24 hours prior to cath.     - Patient had a cath 03/26/2020.          - Echocardiogram 03/24/2020 ejection fraction 45% anteroseptal wall is hypokinetic entire apex is hypokinetic left ventricle demonstrates regional wall motion abnormalities mild LVH.  Right ventricular systolic function normal.     - Cardiology onboard     -  Will defer to nephrology when to restart Lasix.     - toprol, imdur  anemia of chronic renal disease     - Hgb 8.5 today; no evidence of bleed, follow  essential hypertension     - Toprol, hydralazine and Imdur.  Hx of hyperlipidemia     - on Zocor  Hx of tobacco abuse      - continue nicotine patch  DVT prophylaxis: heparin Code Status: FULL Family Communication: Spoke with wife at bedside   Status is: Inpatient  Remains inpatient appropriate because:Inpatient level of care appropriate due to severity of  illness   Dispo: The patient is from: Home              Anticipated d/c is to: Home              Anticipated d/c date is: 2 days              Patient currently is not medically stable to d/c.  Consultants:   Nephrology  Cardiology  Procedures:   LHC, PCI  Antimicrobials:  . None   ROS:  Denies CP, N, V, ab pain. Remainder ROS is negative for all not previously mentioned.  Subjective: "Do I get to go today?"  Objective: Vitals:   03/28/20 1420 03/28/20 1424 03/28/20 1429 03/28/20 1512  BP: 128/71 106/65 127/71 (!) 141/64  Pulse: 73 77 71 73  Resp: 16 16 20 18   Temp:    97.6 F (36.4 C)  TempSrc:     Oral  SpO2: 100% 99% 100% 96%  Weight:      Height:        Intake/Output Summary (Last 24 hours) at 03/28/2020 1611 Last data filed at 03/28/2020 0900 Gross per 24 hour  Intake 660 ml  Output 800 ml  Net -140 ml   Filed Weights   03/26/20 0500 03/27/20 0402 03/28/20 0403  Weight: 100.8 kg 99.9 kg 98.7 kg    Examination:  General: 59 y.o. male resting in bed in NAD Cardiovascular: RRR, +S1, S2, no m/g/r, equal pulses throughout Respiratory: CTABL, no w/r/r, normal WOB GI: BS+, NDNT, no masses noted, no organomegaly noted MSK: No e/c/c Neuro: A&O x 3, no focal deficits Psyc: Appropriate interaction and affect, calm/cooperative  Data Reviewed: I have personally reviewed following labs and imaging studies.  CBC: Recent Labs  Lab 03/24/20 0615 03/24/20 0615 03/25/20 0451 03/25/20 0451 03/26/20 0505 03/26/20 1605 03/26/20 1607 03/26/20 1617 03/28/20 0520  WBC 8.3  --  7.9  --  7.3  --   --   --  7.2  NEUTROABS 4.5  --   --   --   --   --   --   --   --   HGB 9.4*   < > 9.7*   < > 9.5* 9.5* 9.5* 9.5* 8.5*  HCT 29.7*   < > 30.6*   < > 29.7* 28.0* 28.0* 28.0* 26.7*  MCV 95.5  --  95.0  --  92.5  --   --   --  93.0  PLT 186  --  169  --  194  --   --   --  196   < > = values in this interval not displayed.   Basic Metabolic Panel: Recent Labs  Lab 03/24/20 0615 03/24/20 0615 03/24/20 1226 03/25/20 0451 03/25/20 0451 03/26/20 0505 03/26/20 0505 03/26/20 1605 03/26/20 1607 03/26/20 1617 03/27/20 1004 03/28/20 0520  NA 138   < >  --  139   < > 138   < > 142 141 141 138 138  K 4.9   < >  --  4.5   < > 4.4   < > 4.6 4.6 4.6 4.7 4.5  CL 111  --   --  109  --  107  --   --   --   --  109 107  CO2 18*  --   --  17*  --  22  --   --   --   --  19* 20*  GLUCOSE 116*  --   --  125*  --  208*  --   --   --   --  141* 155*  BUN 43*  --   --  44*  --  43*  --   --   --   --  44* 49*  CREATININE 5.78*  --   --  5.90*  --  6.16*  --   --   --   --  6.15* 6.27*  CALCIUM  8.6*  --   --  8.7*  --  8.7*  --   --   --   --  9.0 9.0  MG 2.2  --   --   --   --   --   --   --   --   --   --   --   PHOS  --   --  5.7*  --   --   --   --   --   --   --   --   --    < > = values in this interval not displayed.   GFR: Estimated Creatinine Clearance: 14.6 mL/min (A) (by C-G formula based on SCr of 6.27 mg/dL (H)). Liver Function Tests: No results for input(s): AST, ALT, ALKPHOS, BILITOT, PROT, ALBUMIN in the last 168 hours. No results for input(s): LIPASE, AMYLASE in the last 168 hours. No results for input(s): AMMONIA in the last 168 hours. Coagulation Profile: Recent Labs  Lab 03/24/20 1523  INR 1.4*   Cardiac Enzymes: No results for input(s): CKTOTAL, CKMB, CKMBINDEX, TROPONINI in the last 168 hours. BNP (last 3 results) No results for input(s): PROBNP in the last 8760 hours. HbA1C: No results for input(s): HGBA1C in the last 72 hours. CBG: Recent Labs  Lab 03/27/20 0726 03/27/20 1200 03/27/20 1556 03/27/20 2116 03/28/20 0733  GLUCAP 139* 180* 199* 155* 134*   Lipid Profile: Recent Labs    03/26/20 1255  CHOL 180  HDL 27*  LDLCALC 115*  TRIG 189*  CHOLHDL 6.7   Thyroid Function Tests: No results for input(s): TSH, T4TOTAL, FREET4, T3FREE, THYROIDAB in the last 72 hours. Anemia Panel: No results for input(s): VITAMINB12, FOLATE, FERRITIN, TIBC, IRON, RETICCTPCT in the last 72 hours. Sepsis Labs: No results for input(s): PROCALCITON, LATICACIDVEN in the last 168 hours.  No results found for this or any previous visit (from the past 240 hour(s)).    Radiology Studies: CARDIAC CATHETERIZATION  Result Date: 03/28/2020  1st Diag lesion is 60% stenosed.  Mid LAD lesion is 50% stenosed.  Prox LAD lesion is 95% stenosed.  2nd Mrg-1 lesion is 20% stenosed.  2nd Mrg-2 lesion is 80% stenosed.  LPDA-1 lesion is 30% stenosed.  LPDA-2 lesion is 80% stenosed.  Post intervention, there is a 0% residual stenosis.  A stent was successfully  placed.  Successful complex coronary intervention with diamondback orbital atherectomy and ultimate DES stenting of the 95% severely calcified proximal LAD stenosis with ultimate insertion of a 2.75 x 15 mm Resolute Onyx stent postdilated to 3.0 mm.  The 95% stenosis was reduced to 0%.  There was brisk TIMI-3 flow.  There was no evidence for dissection. RECOMMENDATION: The patient will continue with gentle hydration post procedure with close follow-up of renal function.  Continue DAPT for minimum of 1 year.     Scheduled Meds: . aspirin  81 mg Oral Daily  . [START ON 03/29/2020] atorvastatin  80 mg Oral Daily  . [START ON 03/29/2020] clopidogrel  75 mg Oral  Q breakfast  . [START ON 03/29/2020] heparin  5,000 Units Subcutaneous Q8H  . hydrALAZINE  50 mg Oral TID  . insulin aspart  0-6 Units Subcutaneous TID WC  . isosorbide mononitrate  15 mg Oral Daily  . metoprolol succinate  25 mg Oral Daily  . nicotine  14 mg Transdermal Daily  . sodium chloride flush  3 mL Intravenous Q12H  . sodium chloride flush  3 mL Intravenous Q12H  . sodium chloride flush  3 mL Intravenous Q12H   Continuous Infusions: . sodium chloride    . sodium chloride    . sodium chloride       LOS: 5 days    Time spent: 25 minutes spent in the coordination of care today.    Jonnie Finner, DO [facility]  If 7PM-7AM, please contact night-coverage [email or web address] 03/28/2020, 4:11 PM

## 2020-03-28 NOTE — Progress Notes (Signed)
Progress Note  Patient Name: William Schroeder Date of Encounter: 03/28/2020  [facility] Cardiologist: Carlyle Dolly, MD  Patient Profile     59 y.o. male with a history of CKD stage V not yet on dialysis, hypertension, type 2 diabetes mellitus, and prior CVA who was admitted with NSTEMI and acute CHF after presenting with dyspnea.   Subjective   No acute overnight events. Breathing improved. No chest pain.  Inpatient Medications    Scheduled Meds:  aspirin  81 mg Oral Daily   atorvastatin  80 mg Oral Daily   heparin  5,000 Units Subcutaneous Q8H   hydrALAZINE  50 mg Oral TID   insulin aspart  0-6 Units Subcutaneous TID WC   isosorbide mononitrate  15 mg Oral Daily   metoprolol succinate  25 mg Oral Daily   nicotine  14 mg Transdermal Daily   sodium chloride flush  3 mL Intravenous Q12H   sodium chloride flush  3 mL Intravenous Q12H   sodium chloride flush  3 mL Intravenous Q12H   Continuous Infusions:  sodium chloride     sodium chloride     sodium chloride 75 mL/hr at 03/28/20 0620   PRN Meds: sodium chloride, sodium chloride, acetaminophen, diazepam, ondansetron (ZOFRAN) IV, sodium chloride flush, sodium chloride flush   Vital Signs    Vitals:   03/27/20 1637 03/27/20 1903 03/28/20 0403 03/28/20 0851  BP: (!) 150/67 (!) 144/62 (!) 130/53 124/60  Pulse:  64 64   Resp:  15 16   Temp:  98.2 F (36.8 C) 98.1 F (36.7 C)   TempSrc:  Oral Oral   SpO2:  99% 99%   Weight:   98.7 kg   Height:        Intake/Output Summary (Last 24 hours) at 03/28/2020 1007 Last data filed at 03/28/2020 0900 Gross per 24 hour  Intake 1260 ml  Output 800 ml  Net 460 ml   Last 3 Weights 03/28/2020 03/27/2020 03/26/2020  Weight (lbs) 217 lb 10.2 oz 220 lb 5.2 oz 222 lb 3.6 oz  Weight (kg) 98.72 kg 99.94 kg 100.8 kg  Some encounter information is confidential and restricted. Go to Review Flowsheets activity to see all data.      Telemetry    Normal sinus rhythm  with rates in the 60's to 70's. Some PVCs.  - Personally Reviewed  ECG    No  - Personally Reviewed  Physical Exam   GEN: No acute distress.   Neck: No JVD Cardiac: RRR. No significant murmurs, rubs, or gallops appreciated. Right femoral cath site soft with no signs of hematoma. Respiratory: No increased work of breathing. Clear to auscultation bilaterally. GI: Soft, non-tender, non-distended  MS: No lower extremity edema. No deformity. Neuro:  No focal deficits. Psych: Normal affect. Responds appropriately.   Labs    High Sensitivity Troponin:   Recent Labs  Lab 03/24/20 0615 03/24/20 1226  TROPONINIHS 7,090* 7,421*      Chemistry Recent Labs  Lab 03/26/20 0505 03/26/20 1605 03/26/20 1617 03/27/20 1004 03/28/20 0520  NA 138   < > 141 138 138  K 4.4   < > 4.6 4.7 4.5  CL 107  --   --  109 107  CO2 22  --   --  19* 20*  GLUCOSE 208*  --   --  141* 155*  BUN 43*  --   --  44* 49*  CREATININE 6.16*  --   --  6.15* 6.27*  CALCIUM 8.7*  --   --  9.0 9.0  GFRNONAA 9*  --   --  9* 9*  GFRAA 10*  --   --  10* 10*  ANIONGAP 9  --   --  10 11   < > = values in this interval not displayed.     Hematology Recent Labs  Lab 03/25/20 0451 03/25/20 0451 03/26/20 0505 03/26/20 1605 03/26/20 1607 03/26/20 1617 03/28/20 0520  WBC 7.9  --  7.3  --   --   --  7.2  RBC 3.22*  --  3.21*  --   --   --  2.87*  HGB 9.7*   < > 9.5*   < > 9.5* 9.5* 8.5*  HCT 30.6*   < > 29.7*   < > 28.0* 28.0* 26.7*  MCV 95.0  --  92.5  --   --   --  93.0  MCH 30.1  --  29.6  --   --   --  29.6  MCHC 31.7  --  32.0  --   --   --  31.8  RDW 13.3  --  13.2  --   --   --  13.3  PLT 169  --  194  --   --   --  196   < > = values in this interval not displayed.    BNPNo results for input(s): BNP, PROBNP in the last 168 hours.   DDimer No results for input(s): DDIMER in the last 168 hours.   Radiology    CARDIAC CATHETERIZATION  Result Date: 03/26/2020  2nd Mrg-1 lesion is 20% stenosed.   2nd Mrg-2 lesion is 80% stenosed.  LPDA-1 lesion is 30% stenosed.  LPDA-2 lesion is 80% stenosed.  1st Diag lesion is 30% stenosed.  Mid LAD lesion is 30% stenosed.  Prox LAD lesion is 90% stenosed.  Coronary calcification with a 90% calcified proximal LAD stenosis, 30% first diagonal 30% LAD stenosis after the diagonal takeoff; dominant left circumflex coronary artery with calcification in 20 and 80% circumflex marginal stenoses, 30 and 80% PDA stenosis; small nondominant RCA. Mildly elevated right heart pressure with PA pressure at 34/19; mean PA pressure 24 mm. Contrast used: 17 cc RECOMMENDATION: We will need to monitor renal function post catheterization.  Patient will need intervention to his proximal LAD once stable from a renal standpoint.  Aggressive lipid-lowering therapy.    Cardiac Studies   Echocardiogram 03/25/2020: The left ventricle has mildly decreased function EF ~45%. The anteroseptal wall is hypokinetic. The entire apex is hypokinetic. Mild LVH - indeterminate diastolic pressures.  Normal RV.  Normal valves.  Normal atrial size. _______________  Right/Left Cardiac Catheterization 03/26/2020:  2nd Mrg-1 lesion is 20% stenosed.  2nd Mrg-2 lesion is 80% stenosed.  LPDA-1 lesion is 30% stenosed.  LPDA-2 lesion is 80% stenosed.  1st Diag lesion is 30% stenosed.  Mid LAD lesion is 30% stenosed.  Prox LAD lesion is 90% stenosed.   Coronary calcification with a 90% calcified proximal LAD stenosis, 30% first diagonal 30% LAD stenosis after the diagonal takeoff; dominant left circumflex coronary artery with calcification in 20 and 80% circumflex marginal stenoses, 30 and 80% PDA stenosis; small nondominant RCA.  Mildly elevated right heart pressure with PA pressure at 34/19; mean PA pressure 24 mm.  Contrast used: 17 cc  Recommendation: We will need to monitor renal function post catheterization.  Patient will need intervention to his proximal LAD once stable from a  renal standpoint.  Aggressive lipid-lowering therapy.   Assessment & Plan    NSTEMI (presenting as acute CHF symptoms and not angina); CAD involving native coronary artery with unstable angina/ACS - High-sensitivity troponin peaked at 7,421. - Echo showed LVEF of 45% with hypokinesis of there anteroseptal wall with entire apex being hypokinetic. - R/LHC on 7/19 showed 90% calcified stenosis of proximal LAD, 30% stenosis followed by 80% stenosis of LPDA, 20% stenosis followed by 80% stenosis of 2nd Marginal. Mildly elevated right heart pressures with PA pressure of 34/19 and mean PA pressure of 103mmg.  - No angina.  - Continue Aspirin, beta-blocker, and high-intensity statin. Continue low dose Imdur 15mg  daily. - Plan is for coronary atherectomy of LAD lesion today --patient voiced desire to avoid CABG.   --Precath hydration written, consent obtained yesterday.  High risk consent noted.    Acute on Chronic Combined CHF- Appears euvolemic.  EF 45% with anterior wall motion normality hopefully this will be helped with LAD reperfusion. - Echo as above- > relatively normal right heart cath pressures . -Still seems euvolemic. - Initially started on IV Lasix. Last dose 03/25/2020. Net negative 3.1 L since admission. Weight down another 3 pounds despite not diuresing.  I suspect this is stabilization of weights. -On Toprol 25 mg daily. -Clearly not on ACE inhibitor or ARB due to renal insufficiency.. -Follow ins and outs.  Does not seem to be having any acute acute dyspnea.  Holding Lasix now.  Defer to nephrology.  Hypertension -BP stable - Continue Hydralazine 50mg  TID. Can increase if needed -will wait till post cath, would like to allow for some renal perfusion pressure. - Continue Imdur as above.  CKD Stage V -Patient is pending initiation of outpatient hemodialysis, has fistula in place, however likely require dialysis line if he starts dialysis during this hospitalization. - Creatinine  essentially stable at 6.27 - Nephrology following.  Anemia--baseline hemoglobin 9.5 now 8.5, this is probably related to stopping diuretic and hydration.  No sign of bleeding.  Hemodynamically stable.  I suspect this is also at baseline related to anemia from CKD.     Patient is in route to the Cath Lab now for plan CSI PCI with Dr. Claiborne Billings.  Signed, Glenetta Hew, MD  03/28/2020, 10:07 AM      For questions or updates, please contact [place] Please consult [email or web address] for contact info under

## 2020-03-29 ENCOUNTER — Encounter (HOSPITAL_COMMUNITY): Payer: Self-pay | Admitting: Cardiovascular Disease

## 2020-03-29 DIAGNOSIS — E875 Hyperkalemia: Secondary | ICD-10-CM | POA: Diagnosis not present

## 2020-03-29 LAB — GLUCOSE, CAPILLARY
Glucose-Capillary: 138 mg/dL — ABNORMAL HIGH (ref 70–99)
Glucose-Capillary: 156 mg/dL — ABNORMAL HIGH (ref 70–99)
Glucose-Capillary: 144 mg/dL — ABNORMAL HIGH (ref 70–99)
Glucose-Capillary: 171 mg/dL — ABNORMAL HIGH (ref 70–99)

## 2020-03-29 LAB — BASIC METABOLIC PANEL
Anion gap: 12 (ref 5–15)
BUN: 50 mg/dL — ABNORMAL HIGH (ref 8–23)
CO2: 16 mmol/L — ABNORMAL LOW (ref 22–32)
Calcium: 8.6 mg/dL — ABNORMAL LOW (ref 8.9–10.3)
Chloride: 108 mmol/L (ref 98–111)
Creatinine, Ser: 6 mg/dL — ABNORMAL HIGH (ref 0.61–1.24)
GFR calc Af Amer: 11 mL/min — ABNORMAL LOW (ref >60)
GFR calc non Af Amer: 9 mL/min — ABNORMAL LOW (ref >60)
Glucose, Bld: 142 mg/dL — ABNORMAL HIGH (ref 70–99)
Potassium: 4.8 mmol/L (ref 3.5–5.1)
Sodium: 136 mmol/L (ref 135–145)

## 2020-03-29 LAB — MAGNESIUM: Magnesium: 2.1 mg/dL (ref 1.7–2.4)

## 2020-03-29 LAB — CBC
HCT: 24.2 % — ABNORMAL LOW (ref 39.0–52.0)
Hemoglobin: 7.7 g/dL — ABNORMAL LOW (ref 13.0–17.0)
MCH: 29.5 pg (ref 26.0–34.0)
MCHC: 31.8 g/dL (ref 30.0–36.0)
MCV: 92.7 fL (ref 80.0–100.0)
Platelets: 191 10*3/uL (ref 150–400)
RBC: 2.61 MIL/uL — ABNORMAL LOW (ref 4.22–5.81)
RDW: 13.5 % (ref 11.5–15.5)
WBC: 7 10*3/uL (ref 4.0–10.5)
nRBC: 0 % (ref 0.0–0.2)

## 2020-03-29 MED ORDER — THE SENSUOUS HEART BOOK
Freq: Once | Status: AC
  Filled 2020-03-29: qty 1

## 2020-03-29 MED ORDER — SODIUM CHLORIDE 0.9 % IV SOLN
510.0000 mg | INTRAVENOUS | Status: DC
  Administered 2020-03-29: 510 mg via INTRAVENOUS
  Filled 2020-03-29: qty 17

## 2020-03-29 MED ORDER — HYDRALAZINE HCL 50 MG PO TABS
100.0000 mg | ORAL_TABLET | Freq: Three times a day (TID) | ORAL | Status: DC
  Administered 2020-03-29 – 2020-04-06 (×24): 100 mg via ORAL
  Filled 2020-03-29 (×24): qty 2

## 2020-03-29 MED ORDER — HEART ATTACK BOUNCING BOOK
Freq: Once | Status: AC
  Filled 2020-03-29: qty 1

## 2020-03-29 MED ORDER — SODIUM BICARBONATE 650 MG PO TABS
1300.0000 mg | ORAL_TABLET | Freq: Three times a day (TID) | ORAL | Status: DC
  Administered 2020-03-29 – 2020-04-01 (×11): 1300 mg via ORAL
  Filled 2020-03-29 (×11): qty 2

## 2020-03-29 MED ORDER — ISOSORBIDE MONONITRATE ER 30 MG PO TB24
30.0000 mg | ORAL_TABLET | Freq: Every day | ORAL | Status: DC
  Administered 2020-03-30 – 2020-04-06 (×8): 30 mg via ORAL
  Filled 2020-03-29 (×8): qty 1

## 2020-03-29 MED ORDER — LOPERAMIDE HCL 2 MG PO CAPS
2.0000 mg | ORAL_CAPSULE | ORAL | Status: DC | PRN
  Administered 2020-03-29 (×2): 2 mg via ORAL
  Filled 2020-03-29 (×2): qty 1

## 2020-03-29 MED ORDER — ANGIOPLASTY BOOK
Freq: Once | Status: AC
  Filled 2020-03-29: qty 1

## 2020-03-29 MED FILL — Nitroglycerin IV Soln 100 MCG/ML in D5W: INTRA_ARTERIAL | Qty: 10 | Status: AC

## 2020-03-29 NOTE — Evaluation (Addendum)
Physical Therapy Evaluation Patient Details Name: William Schroeder MRN: [id number] DOB: May 15, 1961 Today's Date: 03/29/2020   History of Present Illness  Pt is a 59 y/o male admitted secondary to SOB and found to have an NSTEMI. Pt now s/p coronary atherectomy of LAD lesion on 7/21. PMH including but not limited to DM, HTN and CVA.    Clinical Impression  Pt presented supine in bed with HOB elevated, awake and willing to participate in therapy session. Prior to admission, pt reported that he ambulated with use of a rollator and was independent with ADLs. Pt lives with his daughter in a single level home with a ramped entrance. At the time of evaluation, pt required min A for bed mobility and mod A for transfers with use of RW and bed slightly elevated. He was able to initiate gait training with side steps at EOB with min A and use of RW. However, pt quickly fatiguing and needed to sit back down. Pt stated that he recently went to [facility] to have a BM and required two person physical assistance with transfers and was likely fatigued from the activity. All VSS throughout with pt on RA. Pt denied any SOB or chest pain throughout. Pt would greatly benefit from further intensive therapy services at CIR to maximize his independence with functional mobility prior to returning home with family support. PT will continue to f/u with pt acutely to progress mobility as tolerated per PT POC.    Follow Up Recommendations CIR    Equipment Recommendations  None recommended by PT    Recommendations for Other Services       Precautions / Restrictions Precautions Precautions: Fall Restrictions Weight Bearing Restrictions: No      Mobility  Bed Mobility Overal bed mobility: Needs Assistance Bed Mobility: Supine to Sit;Sit to Supine     Supine to sit: Min assist Sit to supine: Min guard   General bed mobility comments: increased time and effort, assistance needed for trunk elevation to achieve upright  sitting at EOB  Transfers Overall transfer level: Needs assistance Equipment used: Rolling walker (2 wheeled) Transfers: Sit to/from Stand Sit to Stand: From elevated surface;Mod assist         General transfer comment: cueing for safe hand placement and technique, assistance needed for stability and to power into standing from an elevated bed position  Ambulation/Gait Ambulation/Gait assistance: Min assist   Assistive device: Rolling walker (2 wheeled)       General Gait Details: pt able to take 2-3 side steps at EOB prior to fatiguing and needing to sit back down; assistance needed for safety and stability  Stairs            Wheelchair Mobility    Modified Rankin (Stroke Patients Only)       Balance Overall balance assessment: Needs assistance Sitting-balance support: Feet supported Sitting balance-Leahy Scale: Fair     Standing balance support: Bilateral upper extremity supported;During functional activity Standing balance-Leahy Scale: Poor                               Pertinent Vitals/Pain Pain Assessment: No/denies pain    Home Living Family/patient expects to be discharged to:: Private residence Living Arrangements: Children Available Help at Discharge: Family;Available 24 hours/day Type of Home: House Home Access: Ramped entrance     Home Layout: One level Home Equipment: Walker - 4 wheels;Shower seat;Wheelchair - Banker  Prior Function Level of Independence: Independent with assistive device(s)         Comments: ambulates with a rollator     Hand Dominance   Dominant Hand: Right    Extremity/Trunk Assessment   Upper Extremity Assessment Upper Extremity Assessment: Generalized weakness    Lower Extremity Assessment Lower Extremity Assessment: Generalized weakness       Communication   Communication: No difficulties  Cognition Arousal/Alertness: Awake/alert Behavior During Therapy: WFL for  tasks assessed/performed Overall Cognitive Status: Within Functional Limits for tasks assessed                                        General Comments      Exercises     Assessment/Plan    PT Assessment Patient needs continued PT services  PT Problem List Decreased strength;Decreased activity tolerance;Decreased balance;Decreased coordination;Decreased mobility;Decreased knowledge of use of DME;Decreased safety awareness;Decreased knowledge of precautions;Cardiopulmonary status limiting activity       PT Treatment Interventions DME instruction;Gait training;Stair training;Functional mobility training;Therapeutic activities;Therapeutic exercise;Balance training;Neuromuscular re-education;Patient/family education    PT Goals (Current goals can be found in the Care Plan section)  Acute Rehab PT Goals Patient Stated Goal: to get stronger PT Goal Formulation: With patient Time For Goal Achievement: 04/12/20 Potential to Achieve Goals: Good    Frequency Min 4X/week   Barriers to discharge        Co-evaluation               AM-PAC PT "6 Clicks" Mobility  Outcome Measure Help needed turning from your back to your side while in a flat bed without using bedrails?: A Little Help needed moving from lying on your back to sitting on the side of a flat bed without using bedrails?: A Little Help needed moving to and from a bed to a chair (including a wheelchair)?: A Lot Help needed standing up from a chair using your arms (e.g., wheelchair or bedside chair)?: A Lot Help needed to walk in hospital room?: A Lot Help needed climbing 3-5 steps with a railing? : Total 6 Click Score: 13    End of Session Equipment Utilized During Treatment: Gait belt Activity Tolerance: Patient limited by fatigue Patient left: in bed;with call bell/phone within reach;with bed alarm set Nurse Communication: Mobility status PT Visit Diagnosis: Other abnormalities of gait and mobility  (R26.89);Muscle weakness (generalized) (M62.81)    Time: 9753-0051 PT Time Calculation (min) (ACUTE ONLY): 20 min   Charges:   PT Evaluation $PT Eval Moderate Complexity: 1 Mod          Eduard Clos, PT, DPT  Acute Rehabilitation Services Pager [phone] Office [place] 03/29/2020, 5:19 PM

## 2020-03-29 NOTE — Progress Notes (Addendum)
Progress Note  Patient Name: William Schroeder Date of Encounter: 03/29/2020  [facility] Cardiologist: Carlyle Dolly, MD   Subjective   No acute overnight events. Breathing improved and close to baseline. No chest pain. No palpitations. He does some pain in right femoral cath site.  Inpatient Medications    Scheduled Meds: . aspirin  81 mg Oral Daily  . atorvastatin  80 mg Oral Daily  . clopidogrel  75 mg Oral Q breakfast  . heparin  5,000 Units Subcutaneous Q8H  . hydrALAZINE  50 mg Oral TID  . insulin aspart  0-6 Units Subcutaneous TID WC  . isosorbide mononitrate  15 mg Oral Daily  . metoprolol succinate  25 mg Oral Daily  . nicotine  14 mg Transdermal Daily  . sodium bicarbonate  650 mg Oral BID  . sodium chloride flush  3 mL Intravenous Q12H  . sodium chloride flush  3 mL Intravenous Q12H   Continuous Infusions: . sodium chloride    . sodium chloride    . ferumoxytol     PRN Meds: sodium chloride, sodium chloride, acetaminophen, diazepam, ondansetron (ZOFRAN) IV, sodium chloride flush, sodium chloride flush   Vital Signs    Vitals:   03/29/20 0000 03/29/20 0100 03/29/20 0200 03/29/20 0449  BP: 133/67 140/61 (!) 135/59 (!) 150/62  Pulse: 80 78 74 75  Resp: 17 16 (!) 21 19  Temp:    97.8 F (36.6 C)  TempSrc:    Oral  SpO2: 98% 98% 96% 96%  Weight:    98.6 kg  Height:        Intake/Output Summary (Last 24 hours) at 03/29/2020 0916 Last data filed at 03/29/2020 0036 Gross per 24 hour  Intake 640 ml  Output 520 ml  Net 120 ml   Last 3 Weights 03/29/2020 03/28/2020 03/27/2020  Weight (lbs) 217 lb 6 oz 217 lb 10.2 oz 220 lb 5.2 oz  Weight (kg) 98.6 kg 98.72 kg 99.94 kg  Some encounter information is confidential and restricted. Go to Review Flowsheets activity to see all data.      Telemetry    Normal sinus rhythm with rates in the 70's. - Personally Reviewed  ECG    Normal sinus rhythm with T wave inversions in I and aVL. No significant changes  from prior tracings. - Personally Reviewed  Physical Exam   GEN: No acute distress.   Neck: Supple. No JVD Cardiac: RRR. No murmurs, rubs, or gallops. Right femoral cath site mildly tender and firm just distally to cath site. Respiratory: Clear to auscultation bilaterally. GI: Soft, non-tender, non-distended  MS: No lower extremity edema. No deformity. Neuro:  No focal deficits. Psych: Normal affect.  Labs    High Sensitivity Troponin:   Recent Labs  Lab 03/24/20 0615 03/24/20 1226  TROPONINIHS 7,090* 7,421*      Chemistry Recent Labs  Lab 03/27/20 1004 03/28/20 0520 03/29/20 0405  NA 138 138 136  K 4.7 4.5 4.8  CL 109 107 108  CO2 19* 20* 16*  GLUCOSE 141* 155* 142*  BUN 44* 49* 50*  CREATININE 6.15* 6.27* 6.00*  CALCIUM 9.0 9.0 8.6*  GFRNONAA 9* 9* 9*  GFRAA 10* 10* 11*  ANIONGAP 10 11 12      Hematology Recent Labs  Lab 03/26/20 0505 03/26/20 1605 03/26/20 1617 03/28/20 0520 03/29/20 0405  WBC 7.3  --   --  7.2 7.0  RBC 3.21*  --   --  2.87* 2.61*  HGB 9.5*   < >  9.5* 8.5* 7.7*  HCT 29.7*   < > 28.0* 26.7* 24.2*  MCV 92.5  --   --  93.0 92.7  MCH 29.6  --   --  29.6 29.5  MCHC 32.0  --   --  31.8 31.8  RDW 13.2  --   --  13.3 13.5  PLT 194  --   --  196 191   < > = values in this interval not displayed.    BNPNo results for input(s): BNP, PROBNP in the last 168 hours.   DDimer No results for input(s): DDIMER in the last 168 hours.   Radiology    CARDIAC CATHETERIZATION  Result Date: 03/28/2020  1st Diag lesion is 60% stenosed.  Mid LAD lesion is 50% stenosed.  Prox LAD lesion is 95% stenosed.  2nd Mrg-1 lesion is 20% stenosed.  2nd Mrg-2 lesion is 80% stenosed.  LPDA-1 lesion is 30% stenosed.  LPDA-2 lesion is 80% stenosed.  Post intervention, there is a 0% residual stenosis.  A stent was successfully placed.  Successful complex coronary intervention with diamondback orbital atherectomy and ultimate DES stenting of the 95% severely  calcified proximal LAD stenosis with ultimate insertion of a 2.75 x 15 mm Resolute Onyx stent postdilated to 3.0 mm.  The 95% stenosis was reduced to 0%.  There was brisk TIMI-3 flow.  There was no evidence for dissection. RECOMMENDATION: The patient will continue with gentle hydration post procedure with close follow-up of renal function.  Continue DAPT for minimum of 1 year.    Cardiac Studies   Echocardiogram 03/25/2020:  The left ventricle has mildly decreased functionEF ~45%.The anteroseptal wall is hypokinetic. The entire apex is hypokinetic.Mild LVH - indeterminatediastolic pressures. Normal RV. Normal valves. Normal atrial size. _______________  Right/Left Cardiac Catheterization 03/26/2020:  2nd Mrg-1 lesion is 20% stenosed.  2nd Mrg-2 lesion is 80% stenosed.  LPDA-1 lesion is 30% stenosed.  LPDA-2 lesion is 80% stenosed.  1st Diag lesion is 30% stenosed.  Mid LAD lesion is 30% stenosed.  Prox LAD lesion is 90% stenosed.  Coronary calcification with a 90% calcified proximal LAD stenosis, 30% first diagonal 30% LAD stenosis after the diagonal takeoff; dominant left circumflex coronary artery with calcification in 20 and 80% circumflex marginal stenoses, 30 and 80% PDA stenosis; small nondominant RCA.  Mildly elevated right heart pressure with PA pressure at 34/19; mean PA pressure 24 mm.  Contrast used: 17 cc  Recommendation: We will need to monitor renal function post catheterization. Patient will need intervention to his proximal LAD once stable from a renal standpoint. Aggressive lipid-lowering therapy. _______________  Coronary Atherectomy 03/28/2020:  1st Diag lesion is 60% stenosed.  Mid LAD lesion is 50% stenosed.  Prox LAD lesion is 95% stenosed.  2nd Mrg-1 lesion is 20% stenosed.  2nd Mrg-2 lesion is 80% stenosed.  LPDA-1 lesion is 30% stenosed.  LPDA-2 lesion is 80% stenosed.  Post intervention, there is a 0% residual stenosis.  A  stent was successfully placed.   Successful complex coronary intervention with diamondback orbital atherectomy and ultimate DES stenting of the 95% severely calcified proximal LAD stenosis with ultimate insertion of a 2.75 x 15 mm Resolute Onyx stent postdilated to 3.0 mm.  The 95% stenosis was reduced to 0%.  There was brisk TIMI-3 flow.  There was no evidence for dissection.  RECOMMENDATION: The patient will continue with gentle hydration post procedure with close follow-up of renal function.  Continue DAPT for minimum of 1 year.  Patient Profile     59 y.o. male with a history of CKD stage V not yet on dialysis, hypertension, type 2 diabetes mellitus, and prior CVA who was admitted with NSTEMI and acute CHF after presenting with dyspnea.  Assessment & Plan    NSTEMI (presenting as acute CHF symptoms and not angina); CAD involving native coronary artery with unstable angina - High-sensitivity troponin peaked at 7,421. - Echo showed LVEF of 45% with hypokinesis of there anteroseptal wall with entire apex being hypokinetic. - R/LHC on 7/19 showed 90% calcified stenosis of proximal LAD, 30% stenosis followed by 80% stenosis of LPDA, 20% stenosis followed by 80% stenosis of 2nd Marginal. Mildly elevated right heart pressures with PA pressure of 34/19 and mean PA pressure of 85mmg.  - No angina.  - Continue Aspirin, beta-blocker, and high-intensity statin. Continue low dose Imdur 15mg  daily. - Patient underwent coronary atherectomy of LAD lesion yesterday because he did not want to have bypass surgery.   Acute on Chronic Combined CHF - Echo as above. - Relatively normal right heart cath pressures on 7/19/ - Initially started on IV Lasix. Last dose 03/25/2020. Net negative 3.0 L since admission. Weight down 5lbs since admission. - Appears euvolemic on exam. No need for additional diuretics at this time. - Toprol-Xl 25mg  daily. - No ACEi/ARB given renal function. - Continue to monitor  daily weights, strict I/O's, and renal function.  Hypertension - BP mildly elevated at times. - Continue Hydralazine 50mg  TID. Can increase if needed. - will increase to 100 mg tid & Imdur to 30 mg  - Continue Toprol-XL and Imdur as above. -- continue    CKD Stage V - Has been preparing for dialysis as outpatient with AV fistula in place but not quite ready to use.  - Creatinine stable at 6.00 today. - Nephrology following.  Acute on Chronic Anemia - Likely secondary to CKD. - Hemoglobin trending down 9.7 >> 9.5 >> 8.5 >> 7.7. - Nephrology ordered [facility] today. Will defer need for PRBCs to them. - Of note, patient does have some tenderness of right femoral cath site and some firmness distal to site. Will discuss with MD about whether arterial ultrasound or CT is needed to rule out pseudoaneurysm or bleed.  Deconditioned: - Patient very deconditioned. Cardiac Rehab RN said it took 2 people to help move him to bedside commode.  - Will consult PT.  For questions or updates, please contact [place] Please consult [email or web address] for contact info under   ATTENDING ATTESTATION  I have seen, examined and evaluated the patient this AM along with Sande Rives, PA.  After reviewing all the available data and chart, we discussed the patients laboratory, study & physical findings as well as symptoms in detail. I agree with her findings, examination as well as impression recommendations as per our discussion.    Attending adjustments noted in italics.   Excellent results from cath yesterday.  Unfortunately, he did require 130 mL of contrast.  Certainly day 1 is too soon to see the effects of contrast on renal function.  It is usually day 2-4 when we see this effect.  He did have a drop in hemoglobin levels which actually began before his cath.  Did not seem to have any signs of bleeding.  I suspect that this could be a combination of dehydration and periprocedural loss.  0.5 -0.75  unit of blood loss for a long atherectomy PCI is not unexpected especially given pre and post cath hydration. ->  Have asked nephrology to assist with management decision making re: Feraheme versus PRBC.  Titrating medications for additional blood pressure control. At this point, we are monitoring post-cath.  We have asked physical therapy consultation to assist with mobilization.  Monitoring signs of CIN.  We will follow along    Glenetta Hew, M.D., M.S. Interventional Cardiologist   Pager # [phone] Phone # [phone] [address] [place], [place] [address]        Signed, Darreld Mclean, PA-C  03/29/2020, 9:16 AM

## 2020-03-29 NOTE — Progress Notes (Signed)
[id number] Went to see pt to walk. NT getting pt to [facility] as he needed to have BM. Pt needed much assistance to get to sitting position and he leaned backwards. Two assistance to help sit on side of bed and then use walker to [facility] which was close to bed. Helped pt get cleaned up after BM and settled in bed. Pt's RN in to assist. Pt stated he walks mainly in house. Would recommend PT consult to assist with strengthening and discharge recommendations. Gave pt MI booklet and encouraged plavix for stent. He stated his daughter from [place] was living with him now. Put in referral for CRP 2 [place] to meet requirement for MI and stent protocol but not appropriate for pt at this time.  Did not go over diet as pt on Renal diet and will need dietary instruction. Encouraged pt to stop smoking  And gave smoking cessation handout for pt. Since pt is more appropriate for PT at this time, will follow their progress with pt. Notified cardiology PA that pt needs PT consult. Graylon Good RN BSN 03/29/2020 10:20 AM

## 2020-03-29 NOTE — Progress Notes (Signed)
Admit: 03/23/2020 LOS: 6  43M progresive CKD5 with NSTEMI  Subjective:  Feels well, no complaints.  Denies any shortness of breath, orthopnea, swelling.  Patient reports he is making good amount of urine. 07/21 0701 - 07/22 0700 In: 640 [P.O.:440; I.V.:200] Out: 920 [Urine:920]  Filed Weights   03/27/20 0402 03/28/20 0403 03/29/20 0449  Weight: 99.9 kg 98.7 kg 98.6 kg    Scheduled Meds: . aspirin  81 mg Oral Daily  . atorvastatin  80 mg Oral Daily  . clopidogrel  75 mg Oral Q breakfast  . heparin  5,000 Units Subcutaneous Q8H  . hydrALAZINE  100 mg Oral TID  . insulin aspart  0-6 Units Subcutaneous TID WC  . [START ON 03/30/2020] isosorbide mononitrate  30 mg Oral Daily  . metoprolol succinate  25 mg Oral Daily  . nicotine  14 mg Transdermal Daily  . sodium bicarbonate  650 mg Oral BID  . sodium chloride flush  3 mL Intravenous Q12H  . sodium chloride flush  3 mL Intravenous Q12H   Continuous Infusions: . sodium chloride    . sodium chloride    . ferumoxytol 510 mg (03/29/20 1010)   PRN Meds:.sodium chloride, sodium chloride, acetaminophen, diazepam, loperamide, ondansetron (ZOFRAN) IV, sodium chloride flush, sodium chloride flush  Current Labs: reviewed  Results for CAELEB, BATALLA (MRN [id number]) as of 03/25/2020 10:20  Ref. Range 03/24/2020 06:15  Saturation Ratios Latest Ref Range: 17.9 - 39.5 % 36  Ferritin Latest Ref Range: 24 - 336 ng/mL 26    Physical Exam:  Blood pressure 145/73, pulse 73, temperature 98.2 F (36.8 C), temperature source Oral, resp. rate 16, height 5\' 10"  (1.778 m), weight 98.6 kg, SpO2 96 %. GEN: NAD. Lying flat in bed ENT: NCAT EYES: EOMI CV: RRR no rub PULM: cta bl, no w/r/r/c, unlabored ABD: s/nt/nd SKIN: no rashes/lesions; LUE AVF incision well healed EXT:no sig LEE LUE AVF +B/T   Recent Labs  Lab 03/24/20 1226 03/25/20 0451 03/27/20 1004 03/28/20 0520 03/29/20 0405  NA  --    < > 138 138 136  K  --    < > 4.7 4.5 4.8  CL   --    < > 109 107 108  CO2  --    < > 19* 20* 16*  GLUCOSE  --    < > 141* 155* 142*  BUN  --    < > 44* 49* 50*  CREATININE  --    < > 6.15* 6.27* 6.00*  CALCIUM  --    < > 9.0 9.0 8.6*  PHOS 5.7*  --   --   --   --    < > = values in this interval not displayed.   Recent Labs  Lab 03/24/20 0615 03/25/20 0451 03/26/20 0505 03/26/20 1605 03/26/20 1617 03/28/20 0520 03/29/20 0405  WBC 8.3   < > 7.3  --   --  7.2 7.0  NEUTROABS 4.5  --   --   --   --   --   --   HGB 9.4*   < > 9.5*   < > 9.5* 8.5* 7.7*  HCT 29.7*   < > 29.7*   < > 28.0* 26.7* 24.2*  MCV 95.5   < > 92.5  --   --  93.0 92.7  PLT 186   < > 194  --   --  196 191   < > = values in this interval not displayed.  A 1. CKD5 followed by [facility] / [facility];  Appears is progressive, SCr 4.6 last May.   2. SOB / Hypoxia / Pulm Edema, improved 3. NSTEMI s/p LAD DES 03/28/20 (145mL contrast utilized) 4. LUE AVF +B/T, healing well 5. DM2 6. HTN 7. 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 Metabolic acidosis secondary to #1  P . Start diuresis if signs of hypervolemia.  Overall euvolemic on exam today . We will monitor closely for renal replacement therapy needs.  If this is needed, then would need tunneled dialysis catheter given that his left upper extremity AV fistula is not fully matured based on my examination (typically injury occurs 24-48hrs post contrast).  No indications of renal . Hold on starting RASS blockade for now . Increasing bicarbonate to 1300 mg 3 times daily . Agree with 1 unit PRBC today, will start Feraheme load . Renal diet . Daily weights, Daily Renal Panel, Strict I/Os, Avoid nephrotoxins (NSAIDs, judicious IV Contrast)     Gean Quint, MD [facility]

## 2020-03-29 NOTE — Plan of Care (Signed)
  Problem: Clinical Measurements: Goal: Ability to maintain clinical measurements within normal limits will improve Outcome: Progressing Goal: Respiratory complications will improve Outcome: Progressing   Problem: Elimination: Goal: Will not experience complications related to bowel motility Outcome: Progressing

## 2020-03-29 NOTE — Progress Notes (Signed)
PROGRESS NOTE    William Schroeder  UDJ:[id number] DOB: 05-28-1961 DOA: 03/23/2020 PCP: [facility]   Brief Narrative:   William Caselli Danielsis a 59 y.o.malewith medical history significant forinsulin-dependent diabetes mellitus, history of CVA, hypertension, and chronic kidney disease stage V, now presenting to the emergency department for evaluation of shortness of breath. Patient reports some chronic dyspnea and notes that he typically sleeps in a recliner because of shortness of breath and back pain when he tries to lay flat.His shortness of breath was worse than usual on 03/22/2020, he tried to sleep been a bed that night, but developed severe worsening in his dyspnea and felt as though he cannot catch his breath even when he sat back up. His ex-wife was with him, noted that he appeared pale and struggling to breathe, and EMS was called. Patient denies any cough, denies fevers or chills, and has not experienced any chest discomfort or palpitations associated with this. He denies any leg swelling but reports that his abdomen was swollen recently.  7/22: Scr is stable. He is more acidic. Continuing to follow renal fxn w/ nephro. Appreciate all consultants assistance.   Assessment & Plan:   Principal Problem:   Hypervolemia associated with renal insufficiency Active Problems:   Diabetes mellitus, type 2 (HCC)   Hypertension   CKD (chronic kidney disease), stage V (HCC)   Acute respiratory failure with hypoxia (HCC)   Non-STEMI (non-ST elevated myocardial infarction) (HCC)   Hyperkalemia  NSTEMI     - patient presented with worsening shortness of breath dyspnea on exertion chest x-ray with pulmonary edema elevated BNP and increased elevated troponins.     - LHC 03/26/2020: Coronary calcification with a 90% calcified proximal LAD stenosis, 30% first diagonal 30% LAD stenosis after the diagonal takeoff; dominant left circumflex coronary artery with calcification in 20 and 80%  circumflex marginal stenoses, 30 and 80% PDA stenosis; small nondominant RCA.     - s/p PCI to LAD with atherectomy.     - appreciate cardiology assistance     - ASA/plavix/statin  AKI on CKD stage V Metabolic acidosis     - follow up SCr; nephro onboard, appreciate assistance     - bicarb increased to 1300mg  TID, follow  Hx of type 2 diabetes     - A1c: 7.2     - SSI , DM diet  Hx of stroke      - on aspirin and statin.  acute heart failure with pulmonary edema     - Patient received high dose Lasix now on hold for 24 hours prior to cath.     - Patient had a cath 03/26/2020.          - Echocardiogram 03/24/2020 ejection fraction 45% anteroseptal wall is hypokinetic entire apex is hypokinetic left ventricle demonstrates regional wall motion abnormalities mild LVH.  Right ventricular systolic function normal.     - Cardiology onboard     - Will defer to nephrology when to restart Lasix.     - toprol, imdur  anemia of chronic renal disease     - Hgb 7.7 today; no evidence of bleed, follow     - getting 1 unit pRBCs and feraheme today  essential hypertension     - Toprol, hydralazine and Imdur.  Hx of hyperlipidemia     - on Zocor  Hx of tobacco abuse      - continue nicotine patch  DVT prophylaxis: heparin Code Status: FULL Family Communication:  None at bedside   Status is: Inpatient  Remains inpatient appropriate because:Inpatient level of care appropriate due to severity of illness   Dispo: The patient is from: Home              Anticipated d/c is to: Home              Anticipated d/c date is: 2 days              Patient currently is not medically stable to d/c.  Consultants:   Nephrology  Caridology  Procedures:   LHC, PCI  Antimicrobials:   None   ROS:  Denies CP, N, V, ab pain, dyspnea . Remainder ROS is negative for all not previously mentioned.  Subjective: "Does it look like I'll need dialysis?"  Objective: Vitals:   03/29/20 0100  03/29/20 0200 03/29/20 0449 03/29/20 1019  BP: 140/61 (!) 135/59 (!) 150/62 145/73  Pulse: 78 74 75 73  Resp: 16 (!) 21 19 16   Temp:   97.8 F (36.6 C) 98.2 F (36.8 C)  TempSrc:   Oral Oral  SpO2: 98% 96% 96% 96%  Weight:   98.6 kg   Height:        Intake/Output Summary (Last 24 hours) at 03/29/2020 1555 Last data filed at 03/29/2020 1500 Gross per 24 hour  Intake 640 ml  Output 820 ml  Net -180 ml   Filed Weights   03/27/20 0402 03/28/20 0403 03/29/20 0449  Weight: 99.9 kg 98.7 kg 98.6 kg    Examination:  General: 59 y.o. male resting in bed in NAD Cardiovascular: RRR, +S1, S2, no m/g/r, equal pulses throughout Respiratory: CTABL, no w/r/r, normal WOB GI: BS+, NDNT, no masses noted, no organomegaly noted MSK: No e/c/c Neuro: A&O x 3, no focal deficits Psyc: Appropriate interaction and affect, calm/cooperative   Data Reviewed: I have personally reviewed following labs and imaging studies.  CBC: Recent Labs  Lab 03/24/20 0615 03/24/20 0615 03/25/20 0451 03/25/20 0451 03/26/20 0505 03/26/20 0505 03/26/20 1605 03/26/20 1607 03/26/20 1617 03/28/20 0520 03/29/20 0405  WBC 8.3  --  7.9  --  7.3  --   --   --   --  7.2 7.0  NEUTROABS 4.5  --   --   --   --   --   --   --   --   --   --   HGB 9.4*   < > 9.7*   < > 9.5*   < > 9.5* 9.5* 9.5* 8.5* 7.7*  HCT 29.7*   < > 30.6*   < > 29.7*   < > 28.0* 28.0* 28.0* 26.7* 24.2*  MCV 95.5  --  95.0  --  92.5  --   --   --   --  93.0 92.7  PLT 186  --  169  --  194  --   --   --   --  196 191   < > = values in this interval not displayed.   Basic Metabolic Panel: Recent Labs  Lab 03/24/20 0615 03/24/20 0615 03/24/20 1226 03/25/20 0451 03/25/20 0451 03/26/20 0505 03/26/20 1605 03/26/20 1607 03/26/20 1617 03/27/20 1004 03/28/20 0520 03/29/20 0405  NA 138   < >  --  139   < > 138   < > 141 141 138 138 136  K 4.9   < >  --  4.5   < > 4.4   < > 4.6 4.6 4.7  4.5 4.8  CL 111   < >  --  109  --  107  --   --   --  109  107 108  CO2 18*   < >  --  17*  --  22  --   --   --  19* 20* 16*  GLUCOSE 116*   < >  --  125*  --  208*  --   --   --  141* 155* 142*  BUN 43*   < >  --  44*  --  43*  --   --   --  44* 49* 50*  CREATININE 5.78*   < >  --  5.90*  --  6.16*  --   --   --  6.15* 6.27* 6.00*  CALCIUM 8.6*   < >  --  8.7*  --  8.7*  --   --   --  9.0 9.0 8.6*  MG 2.2  --   --   --   --   --   --   --   --   --   --  2.1  PHOS  --   --  5.7*  --   --   --   --   --   --   --   --   --    < > = values in this interval not displayed.   GFR: Estimated Creatinine Clearance: 15.2 mL/min (A) (by C-G formula based on SCr of 6 mg/dL (H)). Liver Function Tests: No results for input(s): AST, ALT, ALKPHOS, BILITOT, PROT, ALBUMIN in the last 168 hours. No results for input(s): LIPASE, AMYLASE in the last 168 hours. No results for input(s): AMMONIA in the last 168 hours. Coagulation Profile: Recent Labs  Lab 03/24/20 1523  INR 1.4*   Cardiac Enzymes: No results for input(s): CKTOTAL, CKMB, CKMBINDEX, TROPONINI in the last 168 hours. BNP (last 3 results) No results for input(s): PROBNP in the last 8760 hours. HbA1C: No results for input(s): HGBA1C in the last 72 hours. CBG: Recent Labs  Lab 03/27/20 2116 03/28/20 0733 03/28/20 1630 03/29/20 0831 03/29/20 1103  GLUCAP 155* 134* 120* 138* 156*   Lipid Profile: No results for input(s): CHOL, HDL, LDLCALC, TRIG, CHOLHDL, LDLDIRECT in the last 72 hours. Thyroid Function Tests: No results for input(s): TSH, T4TOTAL, FREET4, T3FREE, THYROIDAB in the last 72 hours. Anemia Panel: No results for input(s): VITAMINB12, FOLATE, FERRITIN, TIBC, IRON, RETICCTPCT in the last 72 hours. Sepsis Labs: No results for input(s): PROCALCITON, LATICACIDVEN in the last 168 hours.  No results found for this or any previous visit (from the past 240 hour(s)).    Radiology Studies: CARDIAC CATHETERIZATION  Result Date: 03/28/2020  1st Diag lesion is 60% stenosed.  Mid LAD  lesion is 50% stenosed.  Prox LAD lesion is 95% stenosed.  2nd Mrg-1 lesion is 20% stenosed.  2nd Mrg-2 lesion is 80% stenosed.  LPDA-1 lesion is 30% stenosed.  LPDA-2 lesion is 80% stenosed.  Post intervention, there is a 0% residual stenosis.  A stent was successfully placed.  Successful complex coronary intervention with diamondback orbital atherectomy and ultimate DES stenting of the 95% severely calcified proximal LAD stenosis with ultimate insertion of a 2.75 x 15 mm Resolute Onyx stent postdilated to 3.0 mm.  The 95% stenosis was reduced to 0%.  There was brisk TIMI-3 flow.  There was no evidence for dissection. RECOMMENDATION: The patient will continue with gentle hydration  post procedure with close follow-up of renal function.  Continue DAPT for minimum of 1 year.     Scheduled Meds:  aspirin  81 mg Oral Daily   atorvastatin  80 mg Oral Daily   clopidogrel  75 mg Oral Q breakfast   heparin  5,000 Units Subcutaneous Q8H   hydrALAZINE  100 mg Oral TID   insulin aspart  0-6 Units Subcutaneous TID WC   [START ON 03/30/2020] isosorbide mononitrate  30 mg Oral Daily   metoprolol succinate  25 mg Oral Daily   nicotine  14 mg Transdermal Daily   sodium bicarbonate  1,300 mg Oral TID   sodium chloride flush  3 mL Intravenous Q12H   sodium chloride flush  3 mL Intravenous Q12H   Continuous Infusions:  sodium chloride     sodium chloride     ferumoxytol 510 mg (03/29/20 1010)     LOS: 6 days    Time spent: 25 minutes spent in the coordination of care today.    Jonnie Finner, DO [facility]  If 7PM-7AM, please contact night-coverage [email or web address] 03/29/2020, 3:55 PM

## 2020-03-30 DIAGNOSIS — E875 Hyperkalemia: Secondary | ICD-10-CM | POA: Diagnosis not present

## 2020-03-30 LAB — MAGNESIUM: Magnesium: 2.4 mg/dL (ref 1.7–2.4)

## 2020-03-30 LAB — GLUCOSE, CAPILLARY
Glucose-Capillary: 131 mg/dL — ABNORMAL HIGH (ref 70–99)
Glucose-Capillary: 137 mg/dL — ABNORMAL HIGH (ref 70–99)
Glucose-Capillary: 188 mg/dL — ABNORMAL HIGH (ref 70–99)
Glucose-Capillary: 170 mg/dL — ABNORMAL HIGH (ref 70–99)

## 2020-03-30 LAB — CBC WITH DIFFERENTIAL/PLATELET
Abs Immature Granulocytes: 0.02 10*3/uL (ref 0.00–0.07)
Basophils Absolute: 0 10*3/uL (ref 0.0–0.1)
Basophils Relative: 1 %
Eosinophils Absolute: 0.3 10*3/uL (ref 0.0–0.5)
Eosinophils Relative: 5 %
HCT: 23.9 % — ABNORMAL LOW (ref 39.0–52.0)
Hemoglobin: 7.7 g/dL — ABNORMAL LOW (ref 13.0–17.0)
Immature Granulocytes: 0 %
Lymphocytes Relative: 31 %
Lymphs Abs: 2 10*3/uL (ref 0.7–4.0)
MCH: 30.2 pg (ref 26.0–34.0)
MCHC: 32.2 g/dL (ref 30.0–36.0)
MCV: 93.7 fL (ref 80.0–100.0)
Monocytes Absolute: 0.8 10*3/uL (ref 0.1–1.0)
Monocytes Relative: 12 %
Neutro Abs: 3.3 10*3/uL (ref 1.7–7.7)
Neutrophils Relative %: 51 %
Platelets: 196 10*3/uL (ref 150–400)
RBC: 2.55 MIL/uL — ABNORMAL LOW (ref 4.22–5.81)
RDW: 13.5 % (ref 11.5–15.5)
WBC: 6.5 10*3/uL (ref 4.0–10.5)
nRBC: 0 % (ref 0.0–0.2)

## 2020-03-30 LAB — RENAL FUNCTION PANEL
Albumin: 2.4 g/dL — ABNORMAL LOW (ref 3.5–5.0)
Anion gap: 10 (ref 5–15)
BUN: 54 mg/dL — ABNORMAL HIGH (ref 8–23)
CO2: 19 mmol/L — ABNORMAL LOW (ref 22–32)
Calcium: 8.9 mg/dL (ref 8.9–10.3)
Chloride: 108 mmol/L (ref 98–111)
Creatinine, Ser: 6.44 mg/dL — ABNORMAL HIGH (ref 0.61–1.24)
GFR calc Af Amer: 10 mL/min — ABNORMAL LOW (ref >60)
GFR calc non Af Amer: 9 mL/min — ABNORMAL LOW (ref >60)
Glucose, Bld: 142 mg/dL — ABNORMAL HIGH (ref 70–99)
Phosphorus: 6 mg/dL — ABNORMAL HIGH (ref 2.5–4.6)
Potassium: 4.4 mmol/L (ref 3.5–5.1)
Sodium: 137 mmol/L (ref 135–145)

## 2020-03-30 MED ORDER — METOPROLOL SUCCINATE ER 50 MG PO TB24
50.0000 mg | ORAL_TABLET | Freq: Every day | ORAL | Status: DC
  Administered 2020-03-30 – 2020-04-06 (×8): 50 mg via ORAL
  Filled 2020-03-30 (×8): qty 1

## 2020-03-30 MED ORDER — SODIUM CHLORIDE 0.9% IV SOLUTION: Freq: Once | INTRAVENOUS | Status: AC

## 2020-03-30 NOTE — Care Management Important Message (Signed)
Important Message  Patient Details  Name: William Schroeder MRN: [id number] Date of Birth: February 19, 1961   Medicare Important Message Given:  Yes     Shelda Altes 03/30/2020, 9:33 AM

## 2020-03-30 NOTE — Progress Notes (Signed)
Physical Therapy Treatment Patient Details Name: William Schroeder MRN: [id number] DOB: 09-Jan-1961 Today's Date: 03/30/2020    History of Present Illness Pt is a 59 y/o male admitted secondary to SOB and found to have an NSTEMI. Pt now s/p coronary atherectomy of LAD lesion on 7/21. PMH including but not limited to DM, HTN and CVA.    PT Comments    Pt noted to have soda bottle filled with used chewing tobacco on tray table on entry. When asked pt at first states it is soda. After conversation pt admits to snuff use throughout hospitalization and reports it is not as bad as smoking. Educated that nicotine is present in both chewing tobacco and smoking and both are bad for heart health and impede healing. Pt unfazed and request to go outside to smoke with ambulation in hallway and requests "spit bottle" back at end of session. Bottle removed from room. Pt is limited in safe mobility by decreased cognition in particular safety awareness in presence of decreased strength, balance and endurance. Pt is min A for bed mobility, modA for transfers and min A for majority of ambulation of 80 feet with RW, pt with 1x LoB requiring modA to steady with turning. Given pt's progress with ambulation and desire to return home PT now recommending HHPT at discharge. PT will continue to follow acutely     Follow Up Recommendations  Home health PT;Supervision for mobility/OOB     Equipment Recommendations  None recommended by PT       Precautions / Restrictions Precautions Precautions: Fall Restrictions Weight Bearing Restrictions: No    Mobility  Bed Mobility Overal bed mobility: Needs Assistance Bed Mobility: Supine to Sit     Supine to sit: Min assist     General bed mobility comments: with increased time and effort pt ablel to get LE off bed, requires minA for bringing trunk to upright   Transfers Overall transfer level: Needs assistance Equipment used: Rolling walker (2 wheeled) Transfers: Sit  to/from Stand Sit to Stand: From elevated surface;Mod assist;Min assist;+2 physical assistance         General transfer comment: modAx2 for initial sit>stand for power up and steadying in RW. Pt with additional sit>stand from recliner with ambulation varying from min to modA for steadying   Ambulation/Gait Ambulation/Gait assistance: Min assist;Mod assist Gait Distance (Feet): 100 Feet (1x100, 1x80, 1x10) Assistive device: Rolling walker (2 wheeled) Gait Pattern/deviations: Step-through pattern;Decreased dorsiflexion - left;Decreased weight shift to left;Steppage;Trunk flexed Gait velocity: varying, too fast for instability    General Gait Details: min A for steadying with RW, increased multimodal cuing for proximity to RW, L LE with decreased dorsiflexion from prior injury causing decreased balance, pt with instability throughout and 1x LoB requiring heavy modA for steadying, pt with decreased safety awareness throughout          Balance Overall balance assessment: Needs assistance Sitting-balance support: Feet supported Sitting balance-Leahy Scale: Fair     Standing balance support: Bilateral upper extremity supported;During functional activity Standing balance-Leahy Scale: Poor                              Cognition Arousal/Alertness: Awake/alert Behavior During Therapy: WFL for tasks assessed/performed Overall Cognitive Status: Impaired/Different from baseline Area of Impairment: Safety/judgement                         Safety/Judgement: Decreased awareness of safety;Decreased awareness  of deficits     General Comments: Pt with poor insight on condition and effect of nicotine on healing and heart health, pt with spit bottle in room and admits to use snuff multiple times throughout hospitalization, also request to go outside to smoke during ambulation          General Comments General comments (skin integrity, edema, etc.): RA SaO2 >90%O2  throughout ambulation, max HR noted 98bpm,       Pertinent Vitals/Pain Pain Assessment: No/denies pain    Home Living Family/patient expects to be discharged to:: Private residence Living Arrangements: Children Available Help at Discharge: Family;Available 24 hours/day Type of Home: House Home Access: Ramped entrance   Home Layout: One level Home Equipment: Walker - 4 wheels;Shower seat;Wheelchair - Banker      Prior Function Level of Independence: Independent with assistive device(s)      Comments: ambulates with a rollator   PT Goals (current goals can now be found in the care plan section) Acute Rehab PT Goals Patient Stated Goal: to get stronger PT Goal Formulation: With patient Time For Goal Achievement: 04/12/20 Potential to Achieve Goals: Good Progress towards PT goals: Progressing toward goals    Frequency    Min 3X/week      PT Plan Discharge plan needs to be updated       AM-PAC PT "6 Clicks" Mobility   Outcome Measure  Help needed turning from your back to your side while in a flat bed without using bedrails?: None Help needed moving from lying on your back to sitting on the side of a flat bed without using bedrails?: A Little Help needed moving to and from a bed to a chair (including a wheelchair)?: A Little Help needed standing up from a chair using your arms (e.g., wheelchair or bedside chair)?: A Lot Help needed to walk in hospital room?: A Lot Help needed climbing 3-5 steps with a railing? : Total 6 Click Score: 15    End of Session Equipment Utilized During Treatment: Gait belt Activity Tolerance: Patient limited by fatigue Patient left: in bed;with call bell/phone within reach;with bed alarm set Nurse Communication: Mobility status PT Visit Diagnosis: Other abnormalities of gait and mobility (R26.89);Muscle weakness (generalized) (M62.81)     Time: 4599-7741 PT Time Calculation (min) (ACUTE ONLY): 25 min  Charges:   $Gait Training: 23-37 mins                     Caroline Longie B. Migdalia Dk PT, DPT Acute Rehabilitation Services Pager [phone] Office [phone]    [place] 03/30/2020, 12:23 PM

## 2020-03-30 NOTE — Progress Notes (Addendum)
Progress Note  Patient Name: DOMANICK CUCCIA Date of Encounter: 03/30/2020  [facility] Cardiologist: Carlyle Dolly, MD   Subjective   No acute overnight events. No chest pain. Breathing improved - no shortness of breath when ambulating. Very deconditioned. PT has recommended CIR.  Primary team and nephrology note indicated plans for 1 unit PRBC, but I do not see that this was given.  He was given Feraheme.  Hemoglobin stable.  Inpatient Medications    Scheduled Meds: . aspirin  81 mg Oral Daily  . atorvastatin  80 mg Oral Daily  . clopidogrel  75 mg Oral Q breakfast  . heparin  5,000 Units Subcutaneous Q8H  . hydrALAZINE  100 mg Oral TID  . insulin aspart  0-6 Units Subcutaneous TID WC  . isosorbide mononitrate  30 mg Oral Daily  . metoprolol succinate  25 mg Oral Daily  . nicotine  14 mg Transdermal Daily  . sodium bicarbonate  1,300 mg Oral TID  . sodium chloride flush  3 mL Intravenous Q12H  . sodium chloride flush  3 mL Intravenous Q12H   Continuous Infusions: . sodium chloride    . sodium chloride    . ferumoxytol 510 mg (03/29/20 1010)   PRN Meds: sodium chloride, sodium chloride, acetaminophen, diazepam, loperamide, ondansetron (ZOFRAN) IV, sodium chloride flush, sodium chloride flush   Vital Signs    Vitals:   03/29/20 2052 03/29/20 2214 03/30/20 0347 03/30/20 0600  BP: (!) 162/88 (!) 154/67 (!) 131/59 (!) 127/59  Pulse: 98  67 62  Resp: 18  19 18   Temp: 98.1 F (36.7 C)  97.9 F (36.6 C)   TempSrc: Oral  Axillary   SpO2: 90%  97% 97%  Weight:      Height:        Intake/Output Summary (Last 24 hours) at 03/30/2020 1002 Last data filed at 03/30/2020 0407 Gross per 24 hour  Intake --  Output 575 ml  Net -575 ml   Last 3 Weights 03/29/2020 03/28/2020 03/27/2020  Weight (lbs) 217 lb 6 oz 217 lb 10.2 oz 220 lb 5.2 oz  Weight (kg) 98.6 kg 98.72 kg 99.94 kg  Some encounter information is confidential and restricted. Go to Review Flowsheets activity  to see all data.      Telemetry    Normal sinus rhythm with rates in the 70's to 90's.  - Personally Reviewed  ECG    No new ECG tracing today. - Personally Reviewed  Physical Exam   GEN: No acute distress.   Neck: Supple. Cardiac: RRR. No murmurs, rubs, or gallops. Right femoral cath site with possible mild hematoma but stable from yesterday. Respiratory: Clear to auscultation bilaterally. GI: Soft, non-tender, non-distended  MS: No lower extremity edema. No deformity. Neuro:  No focal deficits. Psych: Normal affect.  Labs    High Sensitivity Troponin:   Recent Labs  Lab 03/24/20 0615 03/24/20 1226  TROPONINIHS 7,090* 7,421*      Chemistry Recent Labs  Lab 03/28/20 0520 03/29/20 0405 03/30/20 0513  NA 138 136 137  K 4.5 4.8 4.4  CL 107 108 108  CO2 20* 16* 19*  GLUCOSE 155* 142* 142*  BUN 49* 50* 54*  CREATININE 6.27* 6.00* 6.44*  CALCIUM 9.0 8.6* 8.9  ALBUMIN  --   --  2.4*  GFRNONAA 9* 9* 9*  GFRAA 10* 11* 10*  ANIONGAP 11 12 10      Hematology Recent Labs  Lab 03/28/20 4403 03/29/20 0405 03/30/20 4742  WBC 7.2 7.0 6.5  RBC 2.87* 2.61* 2.55*  HGB 8.5* 7.7* 7.7*  HCT 26.7* 24.2* 23.9*  MCV 93.0 92.7 93.7  MCH 29.6 29.5 30.2  MCHC 31.8 31.8 32.2  RDW 13.3 13.5 13.5  PLT 196 191 196    BNPNo results for input(s): BNP, PROBNP in the last 168 hours.   DDimer No results for input(s): DDIMER in the last 168 hours.   Radiology    CARDIAC CATHETERIZATION  Result Date: 03/28/2020  1st Diag lesion is 60% stenosed.  Mid LAD lesion is 50% stenosed.  Prox LAD lesion is 95% stenosed.  2nd Mrg-1 lesion is 20% stenosed.  2nd Mrg-2 lesion is 80% stenosed.  LPDA-1 lesion is 30% stenosed.  LPDA-2 lesion is 80% stenosed.  Post intervention, there is a 0% residual stenosis.  A stent was successfully placed.  Successful complex coronary intervention with diamondback orbital atherectomy and ultimate DES stenting of the 95% severely calcified proximal  LAD stenosis with ultimate insertion of a 2.75 x 15 mm Resolute Onyx stent postdilated to 3.0 mm.  The 95% stenosis was reduced to 0%.  There was brisk TIMI-3 flow.  There was no evidence for dissection. RECOMMENDATION: The patient will continue with gentle hydration post procedure with close follow-up of renal function.  Continue DAPT for minimum of 1 year.    Cardiac Studies   Echocardiogram 03/25/2020: The left ventricle has mildly decreased functionEF ~45%.The anteroseptal wall is hypokinetic. The entire apex is hypokinetic.Mild LVH - indeterminatediastolic pressures. Normal RV. Normal valves. Normal atrial size. _______________  Right/Left Cardiac Catheterization 03/26/2020:  2nd Mrg-1 lesion is 20% stenosed.  2nd Mrg-2 lesion is 80% stenosed.  LPDA-1 lesion is 30% stenosed.  LPDA-2 lesion is 80% stenosed.  1st Diag lesion is 30% stenosed.  Mid LAD lesion is 30% stenosed.  Prox LAD lesion is 90% stenosed.  Coronary calcification with a 90% calcified proximal LAD stenosis, 30% first diagonal 30% LAD stenosis after the diagonal takeoff; dominant left circumflex coronary artery with calcification in 20 and 80% circumflex marginal stenoses, 30 and 80% PDA stenosis; small nondominant RCA.  Mildly elevated right heart pressure with PA pressure at 34/19; mean PA pressure 24 mm.  Contrast used: 17 cc  Recommendation: We will need to monitor renal function post catheterization. Patient will need intervention to his proximal LAD once stable from a renal standpoint. Aggressive lipid-lowering therapy. _______________  Coronary Atherectomy 03/28/2020:  1st Diag lesion is 60% stenosed.  Mid LAD lesion is 50% stenosed.  Prox LAD lesion is 95% stenosed.  2nd Mrg-1 lesion is 20% stenosed.  2nd Mrg-2 lesion is 80% stenosed.  LPDA-1 lesion is 30% stenosed.  LPDA-2 lesion is 80% stenosed.  Post intervention, there is a 0% residual stenosis.  A stent was successfully  placed.  Successful complex coronary intervention with diamondback orbital atherectomy and ultimate DES stenting of the 95% severely calcified proximal LAD stenosis with ultimate insertion of a 2.75 x 15 mm Resolute Onyx stent postdilated to 3.0 mm. The 95% stenosis was reduced to 0%. There was brisk TIMI-3 flow. There was no evidence for dissection.  RECOMMENDATION: The patient will continue with gentle hydration post procedure with close follow-up of renal function. Continue DAPT for minimum of 1 year.  Diagnostic Dominance: Left  Intervention     Patient Profile     59 y.o. male with a history of CKD stage V not yet on dialysis, hypertension, type 2 diabetes mellitus, and prior CVA who was admitted with NSTEMI and  acute CHF after presenting with dyspnea.  Assessment & Plan    NSTEMI(presenting as acute CHF symptoms and not angina); CADinvolving native coronary artery with unstable angina - High-sensitivity troponin peaked at 7,421. - Echo showed LVEF of 45% with hypokinesis of there anteroseptal wall with entire apex being hypokinetic. - R/LHC on 7/19 showed 90% calcified stenosis of proximal LAD, 30% stenosis followed by 80% stenosis of LPDA, 20% stenosis followed by 80% stenosis of 2nd Marginal. Mildly elevated right heart pressures with PA pressure of 34/19 and mean PA pressure of 41mmg.  - No angina.  - Continue Aspirin, beta-blocker, and high-intensity statin. Continue Imdur. - Patient underwent coronary atherectomy of LAD lesion yesterday because he did not want to have bypass surgery.   Acute on Chronic Combined CHF - Echo as above. - Relatively normal right heart cath pressures on 7/19/ - Initially started on IV Lasix. Last dose 03/25/2020. Net negative 3.5 L since admission. Weight down 5lbs since admission. - Appears euvolemic on exam. No need for additional diuretics at this time. -Currently on Toprol-Xl 25mg  daily -> with blood pressure still elevated, can  increase to 50 mg - No ACEi/ARB given renal function. - Continue to monitor daily weights, strict I/O's, and renal function.  Hypertension - BP mildly elevated at times but improved with uptitration of Hydralazine and Imdur yesterday. - Continue Hydralazine 100mg  TID (increased yesterday to 100 mg) and Imdur 30mg  daily.  - Increase Toprol-XL to 50 mg as above.  CKD Stage V - Has been preparing for dialysis as outpatient with AV fistula in place but not quite ready to use.  - Creatinine slightly higher at 6.44 today (6.0 yesterday).  - Nephrology following.  Acute on Chronic Anemia - Likely secondary to CKD. - Hemoglobin trending down 9.7 >> 9.5 >> 8.5 >> 7.7 >> 7.7. - Plan was for 1 unit of PRBC and Feraheme yesterday per Nephrology but looks like he only got Feraheme. Hemoglobin stable and no signs of active bleed. So will defer need for PRBC to Nephrology and primary team.  Deconditioned: - Patient very deconditioned.  - PT consulted and recommended CIR at discharge.  For questions or updates, please contact [place] Please consult [email or web address] for contact info under        Signed, Darreld Mclean, PA-C  03/30/2020, 10:02 AM     ATTENDING ATTESTATION  I have seen, examined and evaluated the patient this AM along with Sande Rives, PA.  After reviewing all the available data and chart, we discussed the patients laboratory, study & physical findings as well as symptoms in detail. I agree with her findings, examination as well as impression recommendations as per our discussion.    Attending adjustments noted in italics.   Yoshiaki is doing fairly well.  No active cardiac symptoms.  His groin is a little sore, but no evidence of hematoma.  Thankfully, hemoglobin levels are stable.  No evidence of bleeding.  Question whether the plan was to give PRBC or not.  I think this would help his renal perfusion and energy level.->  Will defer to nephrology and primary team.   Was given Feraheme yesterday.  Blood pressure still elevated despite having increased hydralazine dose yesterday, will increase Toprol to 50 mg a day.  From a cardiac standpoint, would expect effects of contrast nephropathy to be show on day 2-for post cath.  He did have a slight increase in creatinine today, but is still making urine. No further angina and is on  a relatively stable regimen.  At this point, discharge is basically based on primary team and nephrology assessment of his anemia and potential need for rehab.  There is discussion about inpatient rehab.  We will evaluate in the morning to ensure stable renal function and hemoglobin levels as well as assess blood pressures.  If stable will likely sign off.   Glenetta Hew, M.D., M.S. Interventional Cardiologist   Pager # [phone] Phone # [phone] [address] [place], [place] [address]

## 2020-03-30 NOTE — Progress Notes (Signed)
PROGRESS NOTE    William Schroeder  NTZ:[id number] DOB: [birth year] DOA: 03/23/2020 PCP: [facility]   Brief Narrative:   William Schroeder a 59 y.o.malewith medical history significant forinsulin-dependent diabetes mellitus, history of CVA, hypertension, and chronic kidney disease stage V, now presenting to the emergency department for evaluation of shortness of breath. Patient reports some chronic dyspnea and notes that he typically sleeps in a recliner because of shortness of breath and back pain when he tries to lay flat.His shortness of breath was worse than usual on 03/22/2020, he tried to sleep been a bed that night, but developed severe worsening in his dyspnea and felt as though he cannot catch his breath even when he sat back up. His ex-wife was with him, noted that he appeared pale and struggling to breathe, and EMS was called. Patient denies any cough, denies fevers or chills, and has not experienced any chest discomfort or palpitations associated with this. He denies any leg swelling but reports that his abdomen was swollen recently.  7/23: Scr has bumped this morning. Awaiting final nephro recs. Pt wants to go home w/ HHPT. Hgb stable this AM.   Assessment & Plan:   Principal Problem:   Hypervolemia associated with renal insufficiency Active Problems:   Diabetes mellitus, type 2 (HCC)   Hypertension   CKD (chronic kidney disease), stage V (HCC)   Acute respiratory failure with hypoxia (HCC)   Non-STEMI (non-ST elevated myocardial infarction) (HCC)   Hyperkalemia  NSTEMI - patient presented with worsening shortness of breath dyspnea on exertion chest x-ray with pulmonary edema elevated BNP and increased elevated troponins. - LHC 03/26/2020: Coronary calcification with a 90% calcified proximal LAD stenosis, 30% first diagonal 30% LAD stenosis after the diagonal takeoff; dominant left circumflex coronary artery with calcification in 20 and 80% circumflex  marginal stenoses, 30 and 80% PDA stenosis; small nondominant RCA. - s/p PCI to LAD with atherectomy. - appreciate cardiology assistance - ASA/plavix/statin     - 7/23: no changes to above  AKI on CKD stage V Metabolic acidosis - follow up SCr; nephro onboard, appreciate assistance - on bicarb 1300mg  TID, bicarb up to 19; follow  Hx of type 2 diabetes - A1c: 7.2 - SSI , DM diet  Hx of stroke  - on aspirin and statin.  acute heart failure with pulmonary edema - Patient received high dose Lasix now on hold for 24 hours prior to cath. - Patient had a cath 03/26/2020.  - Echocardiogram 03/24/2020 ejection fraction 45% anteroseptal wall is hypokinetic entire apex is hypokinetic left ventricle demonstrates regional wall motion abnormalities mild LVH. Right ventricular systolic function normal. - Cardiology onboard -Will defer to nephrology when to restart Lasix. - toprol, imdur; toprol increased today  anemia of chronic renal disease - Hgb 7.7 today; no evidence of bleed, follow     - 7/23: nephro recs holding on red cells; feraheme given, follow  essential hypertension - Toprol, hydralazine and Imdur.     - 7/23: toprol increased, follow  Hx of hyperlipidemia - on Zocor  Hx of tobacco abuse  - continue nicotine patch  DVT prophylaxis: heparin Code Status: FULL Family Communication: None at bedside   Status is: Inpatient  Remains inpatient appropriate because:Inpatient level of care appropriate due to severity of illness   Dispo: The patient is from: Home              Anticipated d/c is to: Home  Anticipated d/c date is: 2 days              Patient currently is not medically stable to d/c.  Consultants:   Cardiology  Nephrology  Procedures:   LHC, PCI   ROS:  Denies CP, ab pain, dyspnea, N, V. Remainder ROS is negative for all not previously  mentioned.  Subjective: No acute events ON.   Objective: Vitals:   03/30/20 0600 03/30/20 1030 03/30/20 1130 03/30/20 1200  BP: (!) 127/59 (!) 141/65 (!) 151/81 (!) 165/66  Pulse: 62 76 79 76  Resp: 18 15    Temp:      TempSrc:      SpO2: 97% 98% 100% 98%  Weight:      Height:        Intake/Output Summary (Last 24 hours) at 03/30/2020 1435 Last data filed at 03/30/2020 0407 Gross per 24 hour  Intake --  Output 575 ml  Net -575 ml   Filed Weights   03/27/20 0402 03/28/20 0403 03/29/20 0449  Weight: 99.9 kg 98.7 kg 98.6 kg    Examination:  General: 59 y.o. male resting in bed in NAD Cardiovascular: RRR, +S1, S2, no m/g/r, equal pulses throughout Respiratory: CTABL, no w/r/r, normal WOB GI: BS+, NDNT, no masses noted, no organomegaly noted MSK: No e/c/c Neuro: somnolent  Data Reviewed: I have personally reviewed following labs and imaging studies.  CBC: Recent Labs  Lab 03/24/20 0615 03/24/20 0615 03/25/20 0451 03/25/20 0451 03/26/20 0505 03/26/20 1605 03/26/20 1607 03/26/20 1617 03/28/20 0520 03/29/20 0405 03/30/20 0513  WBC 8.3   < > 7.9  --  7.3  --   --   --  7.2 7.0 6.5  NEUTROABS 4.5  --   --   --   --   --   --   --   --   --  3.3  HGB 9.4*   < > 9.7*   < > 9.5*   < > 9.5* 9.5* 8.5* 7.7* 7.7*  HCT 29.7*   < > 30.6*   < > 29.7*   < > 28.0* 28.0* 26.7* 24.2* 23.9*  MCV 95.5   < > 95.0  --  92.5  --   --   --  93.0 92.7 93.7  PLT 186   < > 169  --  194  --   --   --  196 191 196   < > = values in this interval not displayed.   Basic Metabolic Panel: Recent Labs  Lab 03/24/20 0615 03/24/20 1226 03/25/20 0451 03/26/20 0505 03/26/20 1605 03/26/20 1617 03/27/20 1004 03/28/20 0520 03/29/20 0405 03/30/20 0513  NA 138  --    < > 138   < > 141 138 138 136 137  K 4.9  --    < > 4.4   < > 4.6 4.7 4.5 4.8 4.4  CL 111  --    < > 107  --   --  109 107 108 108  CO2 18*  --    < > 22  --   --  19* 20* 16* 19*  GLUCOSE 116*  --    < > 208*  --   --  141*  155* 142* 142*  BUN 43*  --    < > 43*  --   --  44* 49* 50* 54*  CREATININE 5.78*  --    < > 6.16*  --   --  6.15* 6.27* 6.00*  6.44*  CALCIUM 8.6*  --    < > 8.7*  --   --  9.0 9.0 8.6* 8.9  MG 2.2  --   --   --   --   --   --   --  2.1 2.4  PHOS  --  5.7*  --   --   --   --   --   --   --  6.0*   < > = values in this interval not displayed.   GFR: Estimated Creatinine Clearance: 14.2 mL/min (A) (by C-G formula based on SCr of 6.44 mg/dL (H)). Liver Function Tests: Recent Labs  Lab 03/30/20 0513  ALBUMIN 2.4*   No results for input(s): LIPASE, AMYLASE in the last 168 hours. No results for input(s): AMMONIA in the last 168 hours. Coagulation Profile: Recent Labs  Lab 03/24/20 1523  INR 1.4*   Cardiac Enzymes: No results for input(s): CKTOTAL, CKMB, CKMBINDEX, TROPONINI in the last 168 hours. BNP (last 3 results) No results for input(s): PROBNP in the last 8760 hours. HbA1C: No results for input(s): HGBA1C in the last 72 hours. CBG: Recent Labs  Lab 03/29/20 1103 03/29/20 1626 03/29/20 2057 03/30/20 0730 03/30/20 1135  GLUCAP 156* 144* 171* 131* 137*   Lipid Profile: No results for input(s): CHOL, HDL, LDLCALC, TRIG, CHOLHDL, LDLDIRECT in the last 72 hours. Thyroid Function Tests: No results for input(s): TSH, T4TOTAL, FREET4, T3FREE, THYROIDAB in the last 72 hours. Anemia Panel: No results for input(s): VITAMINB12, FOLATE, FERRITIN, TIBC, IRON, RETICCTPCT in the last 72 hours. Sepsis Labs: No results for input(s): PROCALCITON, LATICACIDVEN in the last 168 hours.  No results found for this or any previous visit (from the past 240 hour(s)).    Radiology Studies: No results found.   Scheduled Meds:  aspirin  81 mg Oral Daily   atorvastatin  80 mg Oral Daily   clopidogrel  75 mg Oral Q breakfast   heparin  5,000 Units Subcutaneous Q8H   hydrALAZINE  100 mg Oral TID   insulin aspart  0-6 Units Subcutaneous TID WC   isosorbide mononitrate  30 mg Oral  Daily   metoprolol succinate  50 mg Oral Daily   nicotine  14 mg Transdermal Daily   sodium bicarbonate  1,300 mg Oral TID   sodium chloride flush  3 mL Intravenous Q12H   Continuous Infusions:  sodium chloride     sodium chloride     ferumoxytol 510 mg (03/29/20 1010)     LOS: 7 days    Time spent: 25 minutes spent in the coordination of care today.    William Finner, DO [facility]  If 7PM-7AM, please contact night-coverage [email or web address] 03/30/2020, 2:35 PM

## 2020-03-30 NOTE — Progress Notes (Signed)
Rehab Admissions Coordinator Note:  Patient was screened by Cleatrice Burke for appropriateness for an Inpatient Acute Rehab Consult per PT recs. .  At this time, we are recommending Inpatient Rehab consult. I will place order per protocol.  Cleatrice Burke RN MSN 03/30/2020, 9:28 AM  I can be reached at [phone].

## 2020-03-30 NOTE — Progress Notes (Signed)
Admit: 03/23/2020 LOS: 7  72M progresive CKD5 with NSTEMI  Subjective:  Feels well, no complaints still.  07/22 0701 - 07/23 0700 In: -  Out: 575 [Urine:575]  Filed Weights   03/27/20 0402 03/28/20 0403 03/29/20 0449  Weight: 99.9 kg 98.7 kg 98.6 kg    Scheduled Meds: . aspirin  81 mg Oral Daily  . atorvastatin  80 mg Oral Daily  . clopidogrel  75 mg Oral Q breakfast  . heparin  5,000 Units Subcutaneous Q8H  . hydrALAZINE  100 mg Oral TID  . insulin aspart  0-6 Units Subcutaneous TID WC  . isosorbide mononitrate  30 mg Oral Daily  . metoprolol succinate  50 mg Oral Daily  . nicotine  14 mg Transdermal Daily  . sodium bicarbonate  1,300 mg Oral TID  . sodium chloride flush  3 mL Intravenous Q12H   Continuous Infusions: . sodium chloride    . sodium chloride    . ferumoxytol 510 mg (03/29/20 1010)   PRN Meds:.sodium chloride, sodium chloride, acetaminophen, diazepam, loperamide, ondansetron (ZOFRAN) IV, sodium chloride flush  Current Labs: reviewed  Results for William Schroeder, William Schroeder (MRN [id number]) as of 03/25/2020 10:20  Ref. Range 03/24/2020 06:15  Saturation Ratios Latest Ref Range: 17.9 - 39.5 % 36  Ferritin Latest Ref Range: 24 - 336 ng/mL 26    Physical Exam:  Blood pressure (!) 165/66, pulse 76, temperature 97.9 F (36.6 C), temperature source Axillary, resp. rate 15, height 5\' 10"  (1.778 m), weight 98.6 kg, SpO2 98 %. GEN: NAD. Lying flat in bed ENT: NCAT EYES: EOMI CV: RRR no rub PULM: cta bl, no w/r/r/c, unlabored ABD: s/nt/nd SKIN: no rashes/lesions; LUE AVF incision well healed EXT:no sig LEE LUE AVF +B/T   Recent Labs  Lab 03/24/20 1226 03/25/20 0451 03/28/20 0520 03/29/20 0405 03/30/20 0513  NA  --    < > 138 136 137  K  --    < > 4.5 4.8 4.4  CL  --    < > 107 108 108  CO2  --    < > 20* 16* 19*  GLUCOSE  --    < > 155* 142* 142*  BUN  --    < > 49* 50* 54*  CREATININE  --    < > 6.27* 6.00* 6.44*  CALCIUM  --    < > 9.0 8.6* 8.9  PHOS 5.7*   --   --   --  6.0*   < > = values in this interval not displayed.   Recent Labs  Lab 03/24/20 0615 03/25/20 0451 03/28/20 0520 03/29/20 0405 03/30/20 0513  WBC 8.3   < > 7.2 7.0 6.5  NEUTROABS 4.5  --   --   --  3.3  HGB 9.4*   < > 8.5* 7.7* 7.7*  HCT 29.7*   < > 26.7* 24.2* 23.9*  MCV 95.5   < > 93.0 92.7 93.7  PLT 186   < > 196 191 196   < > = values in this interval not displayed.      A 1. CKD5 followed by William Schroeder / [facility];  Appears is progressive, SCr 4.6 last May.  risk for contrast induced AKI 2. SOB / Hypoxia / Pulm Edema, improved 3. NSTEMI s/p LAD DES 03/28/20 (135mL contrast utilized) 4. LUE AVF +B/T, healing well 5. DM2 6. HTN 7. 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 Metabolic acidosis secondary to #1  P .  Start diuresis if signs of hypervolemia.  Overall euvolemic on exam today, can still hold off for now . No indications for renal replacement therapy as of today, monitoring for now. Renal function overall stable. . Hold on starting RASS blockade for now . Increased sodium bicarbonate to 1300 mg 3 times daily, can increase to 1950mg  tid if needed (bicarb improved today) . S/p fereheme load, can hold on prbc unless hgb lower (stable today), repeat fereheme next week . Renal diet . Daily weights, Daily Renal Panel, Strict I/Os, Avoid nephrotoxins (NSAIDs, judicious IV Contrast)     Gean Quint, MD [facility]

## 2020-03-31 DIAGNOSIS — E875 Hyperkalemia: Secondary | ICD-10-CM | POA: Diagnosis not present

## 2020-03-31 DIAGNOSIS — J9601 Acute respiratory failure with hypoxia: Secondary | ICD-10-CM

## 2020-03-31 LAB — RENAL FUNCTION PANEL
Albumin: 2.4 g/dL — ABNORMAL LOW (ref 3.5–5.0)
Anion gap: 12 (ref 5–15)
BUN: 56 mg/dL — ABNORMAL HIGH (ref 8–23)
CO2: 19 mmol/L — ABNORMAL LOW (ref 22–32)
Calcium: 8.8 mg/dL — ABNORMAL LOW (ref 8.9–10.3)
Chloride: 106 mmol/L (ref 98–111)
Creatinine, Ser: 7.52 mg/dL — ABNORMAL HIGH (ref 0.61–1.24)
GFR calc Af Amer: 8 mL/min — ABNORMAL LOW (ref >60)
GFR calc non Af Amer: 7 mL/min — ABNORMAL LOW (ref >60)
Glucose, Bld: 138 mg/dL — ABNORMAL HIGH (ref 70–99)
Phosphorus: 6.7 mg/dL — ABNORMAL HIGH (ref 2.5–4.6)
Potassium: 4 mmol/L (ref 3.5–5.1)
Sodium: 137 mmol/L (ref 135–145)

## 2020-03-31 LAB — CBC WITH DIFFERENTIAL/PLATELET
Abs Immature Granulocytes: 0.04 10*3/uL (ref 0.00–0.07)
Basophils Absolute: 0 10*3/uL (ref 0.0–0.1)
Basophils Relative: 1 %
Eosinophils Absolute: 0.3 10*3/uL (ref 0.0–0.5)
Eosinophils Relative: 5 %
HCT: 22 % — ABNORMAL LOW (ref 39.0–52.0)
Hemoglobin: 7 g/dL — ABNORMAL LOW (ref 13.0–17.0)
Immature Granulocytes: 1 %
Lymphocytes Relative: 29 %
Lymphs Abs: 1.8 10*3/uL (ref 0.7–4.0)
MCH: 30.3 pg (ref 26.0–34.0)
MCHC: 31.8 g/dL (ref 30.0–36.0)
MCV: 95.2 fL (ref 80.0–100.0)
Monocytes Absolute: 0.8 10*3/uL (ref 0.1–1.0)
Monocytes Relative: 12 %
Neutro Abs: 3.3 10*3/uL (ref 1.7–7.7)
Neutrophils Relative %: 52 %
Platelets: 201 10*3/uL (ref 150–400)
RBC: 2.31 MIL/uL — ABNORMAL LOW (ref 4.22–5.81)
RDW: 13.7 % (ref 11.5–15.5)
WBC: 6.2 10*3/uL (ref 4.0–10.5)
nRBC: 0 % (ref 0.0–0.2)

## 2020-03-31 LAB — GLUCOSE, CAPILLARY
Glucose-Capillary: 134 mg/dL — ABNORMAL HIGH (ref 70–99)
Glucose-Capillary: 152 mg/dL — ABNORMAL HIGH (ref 70–99)
Glucose-Capillary: 178 mg/dL — ABNORMAL HIGH (ref 70–99)
Glucose-Capillary: 140 mg/dL — ABNORMAL HIGH (ref 70–99)

## 2020-03-31 LAB — MAGNESIUM: Magnesium: 2.4 mg/dL (ref 1.7–2.4)

## 2020-03-31 MED ORDER — SODIUM CHLORIDE 0.9% IV SOLUTION: Freq: Once | INTRAVENOUS | Status: AC

## 2020-03-31 NOTE — Progress Notes (Signed)
Inpatient Rehab Admissions Coordinator:    Consult for CIR received; however, Pt. Recently walked combined 190 ft min/mod A with PT and therapy has changed recommendations to [place] at dc. Spoke with Pt. At bedside and he is in agreement with this plan.   Clemens Catholic, [place], [place] Admissions Coordinator  [phone] ([place]) [phone] (office)

## 2020-03-31 NOTE — Progress Notes (Signed)
PROGRESS NOTE    William Schroeder  ATF:[id number] DOB: 1960/10/10 DOA: 03/23/2020 PCP: [facility]   Brief Narrative:   William Geiler Danielsis a 59 y.o.malewith medical history significant forinsulin-dependent diabetes mellitus, history of CVA, hypertension, and chronic kidney disease stage V, now presenting to the emergency department for evaluation of shortness of breath. Patient reports some chronic dyspnea and notes that he typically sleeps in a recliner because of shortness of breath and back pain when he tries to lay flat.His shortness of breath was worse than usual on 03/22/2020, he tried to sleep been a bed that night, but developed severe worsening in his dyspnea and felt as though he cannot catch his breath even when he sat back up. His ex-wife was with him, noted that he appeared pale and struggling to breathe, and EMS was called. Patient denies any cough, denies fevers or chills, and has not experienced any chest discomfort or palpitations associated with this. He denies any leg swelling but reports that his abdomen was swollen recently.  7/24: Scr up. Still acidic. Hgb down. Getting pRBCs. Follow.  Assessment & Plan:   Principal Problem:   Hypervolemia associated with renal insufficiency Active Problems:   Diabetes mellitus, type 2 (HCC)   Hypertension   CKD (chronic kidney disease), stage V (HCC)   Acute respiratory failure with hypoxia (HCC)   Non-STEMI (non-ST elevated myocardial infarction) (HCC)   Hyperkalemia  NSTEMI - patient presented with worsening shortness of breath dyspnea on exertion chest x-ray with pulmonary edema elevated BNP and increased elevated troponins. - LHC 03/26/2020: Coronary calcification with a 90% calcified proximal LAD stenosis, 30% first diagonal 30% LAD stenosis after the diagonal takeoff; dominant left circumflex coronary artery with calcification in 20 and 80% circumflex marginal stenoses, 30 and 80% PDA stenosis; small  nondominant RCA. - s/p coronary atherectomy to LAD - appreciate cardiology assistance - ASA/plavix/statin     - 7/24: stable; hgb down to 7.0. Getting 1 unit pRBCs given cardiac Hx  AKI on CKD stage V Metabolic acidosis Hyperphosphatemia - follow up SCr; nephro onboard, appreciate assistance -on bicarb 1300mg  TID, stable at 19; no dialysis for now  Hx of type 2 diabetes - A1c: 7.2 - SSI , DM diet     - 7/24: glucose acceptable, follow  Hx of stroke  - on aspirin and statin.  acute heart failure with pulmonary edema - Patient received high dose Lasix now on hold for 24 hours prior to cath. - Patient had a cath 03/26/2020.  - Echocardiogram 03/24/2020 ejection fraction 45% anteroseptal wall is hypokinetic entire apex is hypokinetic left ventricle demonstrates regional wall motion abnormalities mild LVH. Right ventricular systolic function normal. - Cardiology onboard -Will defer to nephrology when to restart Lasix. - toprol, imdur; toprol increased today  anemia of chronic renal disease - Hgb7.0today; no evidence of bleed, follow - 7/24:  Getting red cells today.  essential hypertension - Toprol, hydralazine and Imdur.     - BP acceptable, follow  Hx of hyperlipidemia - on Zocor  Hx of tobacco abuse  - continue nicotine patch     - continues to chew dip in room; advised against continuing this behavior   DVT prophylaxis: heparin Code Status: FULL Family Communication: None at bedside   Status is: Inpatient  Remains inpatient appropriate because:Inpatient level of care appropriate due to severity of illness   Dispo: The patient is from: Home              Anticipated  d/c is to: Home              Anticipated d/c date is: 3 days              Patient currently is not medically stable to d/c.  Consultants:   Cardiology  Nephrology  Procedures:   LHC, atherectomy   ROS:    Denies dyspnea, N, V, ab pain, CP . Remainder 10-pt ROS is negative for all not previously mentioned.  Subjective: "This is my breakfast, dinner, and desert."  Objective: Vitals:   03/30/20 2225 03/31/20 0025 03/31/20 0425 03/31/20 0950  BP: (!) 133/64 (!) 125/61 (!) 131/58 (!) 151/64  Pulse: 71 72 64 85  Resp: 15 15 14  (!) 24  Temp:  97.9 F (36.6 C) 98.1 F (36.7 C) 98.5 F (36.9 C)  TempSrc:  Oral Oral Oral  SpO2: 97% 97% 99% 100%  Weight:   (!) 103.1 kg   Height:        Intake/Output Summary (Last 24 hours) at 03/31/2020 1240 Last data filed at 03/30/2020 2200 Gross per 24 hour  Intake 50 ml  Output 400 ml  Net -350 ml   Filed Weights   03/28/20 0403 03/29/20 0449 03/31/20 0425  Weight: 98.7 kg 98.6 kg (!) 103.1 kg    Examination:  General: 59 y.o. male resting in chair in NAD Cardiovascular: RRR, +S1, S2, no m/g/r Respiratory: CTABL, no w/r/r, normal WOB GI: BS+, NDNT, soft MSK: No e/c/c Neuro: A&O x 3, no focal deficits Psyc: Appropriate interaction and affect, calm/cooperative   Data Reviewed: I have personally reviewed following labs and imaging studies.  CBC: Recent Labs  Lab 03/26/20 0505 03/26/20 1605 03/26/20 1617 03/28/20 0520 03/29/20 0405 03/30/20 0513 03/31/20 0541  WBC 7.3  --   --  7.2 7.0 6.5 6.2  NEUTROABS  --   --   --   --   --  3.3 3.3  HGB 9.5*   < > 9.5* 8.5* 7.7* 7.7* 7.0*  HCT 29.7*   < > 28.0* 26.7* 24.2* 23.9* 22.0*  MCV 92.5  --   --  93.0 92.7 93.7 95.2  PLT 194  --   --  196 191 196 201   < > = values in this interval not displayed.   Basic Metabolic Panel: Recent Labs  Lab 03/27/20 1004 03/28/20 0520 03/29/20 0405 03/30/20 0513 03/31/20 0541  NA 138 138 136 137 137  K 4.7 4.5 4.8 4.4 4.0  CL 109 107 108 108 106  CO2 19* 20* 16* 19* 19*  GLUCOSE 141* 155* 142* 142* 138*  BUN 44* 49* 50* 54* 56*  CREATININE 6.15* 6.27* 6.00* 6.44* 7.52*  CALCIUM 9.0 9.0 8.6* 8.9 8.8*  MG  --   --  2.1 2.4 2.4  PHOS  --    --   --  6.0* 6.7*   GFR: Estimated Creatinine Clearance: 12.4 mL/min (A) (by C-G formula based on SCr of 7.52 mg/dL (H)). Liver Function Tests: Recent Labs  Lab 03/30/20 0513 03/31/20 0541  ALBUMIN 2.4* 2.4*   No results for input(s): LIPASE, AMYLASE in the last 168 hours. No results for input(s): AMMONIA in the last 168 hours. Coagulation Profile: Recent Labs  Lab 03/24/20 1523  INR 1.4*   Cardiac Enzymes: No results for input(s): CKTOTAL, CKMB, CKMBINDEX, TROPONINI in the last 168 hours. BNP (last 3 results) No results for input(s): PROBNP in the last 8760 hours. HbA1C: No results for input(s): HGBA1C in the  last 72 hours. CBG: Recent Labs  Lab 03/30/20 1135 03/30/20 1617 03/30/20 2156 03/31/20 0805 03/31/20 1131  GLUCAP 137* 188* 170* 134* 152*   Lipid Profile: No results for input(s): CHOL, HDL, LDLCALC, TRIG, CHOLHDL, LDLDIRECT in the last 72 hours. Thyroid Function Tests: No results for input(s): TSH, T4TOTAL, FREET4, T3FREE, THYROIDAB in the last 72 hours. Anemia Panel: No results for input(s): VITAMINB12, FOLATE, FERRITIN, TIBC, IRON, RETICCTPCT in the last 72 hours. Sepsis Labs: No results for input(s): PROCALCITON, LATICACIDVEN in the last 168 hours.  No results found for this or any previous visit (from the past 240 hour(s)).    Radiology Studies: No results found.   Scheduled Meds: . sodium chloride   Intravenous Once  . aspirin  81 mg Oral Daily  . atorvastatin  80 mg Oral Daily  . clopidogrel  75 mg Oral Q breakfast  . heparin  5,000 Units Subcutaneous Q8H  . hydrALAZINE  100 mg Oral TID  . insulin aspart  0-6 Units Subcutaneous TID WC  . isosorbide mononitrate  30 mg Oral Daily  . metoprolol succinate  50 mg Oral Daily  . nicotine  14 mg Transdermal Daily  . sodium bicarbonate  1,300 mg Oral TID  . sodium chloride flush  3 mL Intravenous Q12H   Continuous Infusions: . sodium chloride    . sodium chloride    . ferumoxytol 510 mg  (03/29/20 1010)     LOS: 8 days    Time spent: 25 minutes spent in the coordination of care today.    Jonnie Finner, DO [facility]  If 7PM-7AM, please contact night-coverage [email or web address] 03/31/2020, 12:40 PM

## 2020-03-31 NOTE — Progress Notes (Signed)
CARDIAC REHAB PHASE I   PRE:  Rate/Rhythm: 68 SR  BP:  Supine:   Sitting: 145/64  Standing:    SaO2: 95 RA  MODE:  Ambulation: 120 ft   POST:  Rate/Rhythm: 86 SR  BP:  Supine:   Sitting: 151/64  Standing:    SaO2: 100 RA 0920-1000  Assisted X 2 used gait belt and rollator to ambulate. Gait unsteady due to fatigue and with not being able to place left foot on floor. He walks on his toes.States that he had a brace for his left leg but the one he had caused him to develop a wound that became infected and that he he needs to get a new one made. Pt to recliner after walk with call light in reach. Deconditioned and unsteady will keep as assist X2.  Rodney Langton RN 03/31/2020 10:04 AM

## 2020-03-31 NOTE — Progress Notes (Signed)
Admit: 03/23/2020 LOS: 8  43M progresive CKD5 with NSTEMI  Subjective:  Feels well, no new events or complaints. No n/v/dysgeusia, loss of appetite, sob, orthopnea, dizziness. Renal function worsened today. Only urinated once yesterday  07/23 0701 - 07/24 0700 In: 50 [P.O.:50] Out: 400 [Urine:400]  Filed Weights   03/28/20 0403 03/29/20 0449 03/31/20 0425  Weight: 98.7 kg 98.6 kg (!) 103.1 kg    Scheduled Meds: . sodium chloride   Intravenous Once  . aspirin  81 mg Oral Daily  . atorvastatin  80 mg Oral Daily  . clopidogrel  75 mg Oral Q breakfast  . heparin  5,000 Units Subcutaneous Q8H  . hydrALAZINE  100 mg Oral TID  . insulin aspart  0-6 Units Subcutaneous TID WC  . isosorbide mononitrate  30 mg Oral Daily  . metoprolol succinate  50 mg Oral Daily  . nicotine  14 mg Transdermal Daily  . sodium bicarbonate  1,300 mg Oral TID  . sodium chloride flush  3 mL Intravenous Q12H   Continuous Infusions: . sodium chloride    . sodium chloride    . ferumoxytol 510 mg (03/29/20 1010)   PRN Meds:.sodium chloride, sodium chloride, acetaminophen, diazepam, loperamide, ondansetron (ZOFRAN) IV, sodium chloride flush  Current Labs: reviewed  Results for ALVIE, FOWLES (MRN [id number]) as of 03/25/2020 10:20  Ref. Range 03/24/2020 06:15  Saturation Ratios Latest Ref Range: 17.9 - 39.5 % 36  Ferritin Latest Ref Range: 24 - 336 ng/mL 26    Physical Exam:  Blood pressure (!) 131/58, pulse 64, temperature 98.1 F (36.7 C), temperature source Oral, resp. rate 14, height 5\' 10"  (1.778 m), weight (!) 103.1 kg, SpO2 99 %. GEN: NAD. Sitting up in chair ENT: NCAT EYES: EOMI CV: RRR no rub PULM: normal wob ABD: s/nt/nd SKIN: no rashes/lesions; LUE AVF incision well healed EXT:no sig LEE Neuro: speech clear and coherent, no asterixis LUE AVF +B/T   Recent Labs  Lab 03/24/20 1226 03/25/20 0451 03/29/20 0405 03/30/20 0513 03/31/20 0541  NA  --    < > 136 137 137  K  --    < > 4.8  4.4 4.0  CL  --    < > 108 108 106  CO2  --    < > 16* 19* 19*  GLUCOSE  --    < > 142* 142* 138*  BUN  --    < > 50* 54* 56*  CREATININE  --    < > 6.00* 6.44* 7.52*  CALCIUM  --    < > 8.6* 8.9 8.8*  PHOS 5.7*  --   --  6.0* 6.7*   < > = values in this interval not displayed.   Recent Labs  Lab 03/29/20 0405 03/30/20 0513 03/31/20 0541  WBC 7.0 6.5 6.2  NEUTROABS  --  3.3 3.3  HGB 7.7* 7.7* 7.0*  HCT 24.2* 23.9* 22.0*  MCV 92.7 93.7 95.2  PLT 191 196 201      A 1. CKD5 followed by [facility] / [facility];  Appears is progressive, SCr 4.6 last May.  risk for contrast induced AKI 2. SOB / Hypoxia / Pulm Edema, improved 3. NSTEMI s/p LAD DES 03/28/20 (115mL contrast utilized) 4. LUE AVF +B/T, healing well 5. DM2 6. HTN 7. 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 Metabolic acidosis secondary to #1, improved  P . Start diuresis if signs of hypervolemia.  Overall euvolemic on exam today, can still  hold off for now . No indications for renal replacement therapy as of today despite rise in Cr, monitoring for now. Will need tunneled dialysis catheter if dialysis is to be started . Hold on starting RASS blockade for now . Increased sodium bicarbonate to 1300 mg 3 times daily--continue this for now, can increase to 1950mg  tid if needed (bicarb improved today) . S/p fereheme load . Will order 1u prbc today . Renal diet . Daily weights, Daily Renal Panel, Strict I/Os, Avoid nephrotoxins (NSAIDs, judicious IV Contrast)     Gean Quint, MD [facility]

## 2020-03-31 NOTE — Progress Notes (Signed)
Progress Note  Patient Name: William Schroeder Date of Encounter: 03/31/2020  [facility] Cardiologist: Carlyle Dolly, MD   Subjective   No chest pain overnight - hemoglobin decreased to 7 today (from 7.7) - nephrology following- creatinine up, no urgent need for dialysis.  Inpatient Medications    Scheduled Meds: . sodium chloride   Intravenous Once  . aspirin  81 mg Oral Daily  . atorvastatin  80 mg Oral Daily  . clopidogrel  75 mg Oral Q breakfast  . heparin  5,000 Units Subcutaneous Q8H  . hydrALAZINE  100 mg Oral TID  . insulin aspart  0-6 Units Subcutaneous TID WC  . isosorbide mononitrate  30 mg Oral Daily  . metoprolol succinate  50 mg Oral Daily  . nicotine  14 mg Transdermal Daily  . sodium bicarbonate  1,300 mg Oral TID  . sodium chloride flush  3 mL Intravenous Q12H   Continuous Infusions: . sodium chloride    . sodium chloride    . ferumoxytol 510 mg (03/29/20 1010)   PRN Meds: sodium chloride, sodium chloride, acetaminophen, diazepam, loperamide, ondansetron (ZOFRAN) IV, sodium chloride flush   Vital Signs    Vitals:   03/30/20 2025 03/30/20 2225 03/31/20 0025 03/31/20 0425  BP: (!) 149/66 (!) 133/64 (!) 125/61 (!) 131/58  Pulse: 76 71 72 64  Resp: 14 15 15 14   Temp: 98 F (36.7 C)  97.9 F (36.6 C) 98.1 F (36.7 C)  TempSrc: Oral  Oral Oral  SpO2: 100% 97% 97% 99%  Weight:    (!) 103.1 kg  Height:        Intake/Output Summary (Last 24 hours) at 03/31/2020 1013 Last data filed at 03/30/2020 2200 Gross per 24 hour  Intake 50 ml  Output 400 ml  Net -350 ml   Last 3 Weights 03/31/2020 03/29/2020 03/28/2020  Weight (lbs) 227 lb 4.8 oz 217 lb 6 oz 217 lb 10.2 oz  Weight (kg) 103.103 kg 98.6 kg 98.72 kg  Some encounter information is confidential and restricted. Go to Review Flowsheets activity to see all data.      Telemetry    Sinus rhythm at 70  - Personally Reviewed  ECG    No new ECG tracing today. - Personally Reviewed  Physical  Exam   GEN: No acute distress.   Neck: Supple. Cardiac: RRR. No murmurs, rubs, or gallops. Right femoral cath site with possible mild hematoma but stable from yesterday. Respiratory: Clear to auscultation bilaterally. GI: Soft, non-tender, non-distended  MS: No lower extremity edema. No deformity. Neuro:  No focal deficits. Psych: Normal affect.  Labs    High Sensitivity Troponin:   Recent Labs  Lab 03/24/20 0615 03/24/20 1226  TROPONINIHS 7,090* 7,421*      Chemistry Recent Labs  Lab 03/29/20 0405 03/30/20 0513 03/31/20 0541  NA 136 137 137  K 4.8 4.4 4.0  CL 108 108 106  CO2 16* 19* 19*  GLUCOSE 142* 142* 138*  BUN 50* 54* 56*  CREATININE 6.00* 6.44* 7.52*  CALCIUM 8.6* 8.9 8.8*  ALBUMIN  --  2.4* 2.4*  GFRNONAA 9* 9* 7*  GFRAA 11* 10* 8*  ANIONGAP 12 10 12      Hematology Recent Labs  Lab 03/29/20 0405 03/30/20 0513 03/31/20 0541  WBC 7.0 6.5 6.2  RBC 2.61* 2.55* 2.31*  HGB 7.7* 7.7* 7.0*  HCT 24.2* 23.9* 22.0*  MCV 92.7 93.7 95.2  MCH 29.5 30.2 30.3  MCHC 31.8 32.2 31.8  RDW  13.5 13.5 13.7  PLT 191 196 201    BNPNo results for input(s): BNP, PROBNP in the last 168 hours.   DDimer No results for input(s): DDIMER in the last 168 hours.   Radiology    No results found.  Cardiac Studies   Echocardiogram 03/25/2020: The left ventricle has mildly decreased functionEF ~45%.The anteroseptal wall is hypokinetic. The entire apex is hypokinetic.Mild LVH - indeterminatediastolic pressures. Normal RV. Normal valves. Normal atrial size. _______________  Right/Left Cardiac Catheterization 03/26/2020:  2nd Mrg-1 lesion is 20% stenosed.  2nd Mrg-2 lesion is 80% stenosed.  LPDA-1 lesion is 30% stenosed.  LPDA-2 lesion is 80% stenosed.  1st Diag lesion is 30% stenosed.  Mid LAD lesion is 30% stenosed.  Prox LAD lesion is 90% stenosed.  Coronary calcification with a 90% calcified proximal LAD stenosis, 30% first diagonal 30% LAD stenosis  after the diagonal takeoff; dominant left circumflex coronary artery with calcification in 20 and 80% circumflex marginal stenoses, 30 and 80% PDA stenosis; small nondominant RCA.  Mildly elevated right heart pressure with PA pressure at 34/19; mean PA pressure 24 mm.  Contrast used: 17 cc  Recommendation: We will need to monitor renal function post catheterization. Patient will need intervention to his proximal LAD once stable from a renal standpoint. Aggressive lipid-lowering therapy. _______________  Coronary Atherectomy 03/28/2020:  1st Diag lesion is 60% stenosed.  Mid LAD lesion is 50% stenosed.  Prox LAD lesion is 95% stenosed.  2nd Mrg-1 lesion is 20% stenosed.  2nd Mrg-2 lesion is 80% stenosed.  LPDA-1 lesion is 30% stenosed.  LPDA-2 lesion is 80% stenosed.  Post intervention, there is a 0% residual stenosis.  A stent was successfully placed.  Successful complex coronary intervention with diamondback orbital atherectomy and ultimate DES stenting of the 95% severely calcified proximal LAD stenosis with ultimate insertion of a 2.75 x 15 mm Resolute Onyx stent postdilated to 3.0 mm. The 95% stenosis was reduced to 0%. There was brisk TIMI-3 flow. There was no evidence for dissection.  RECOMMENDATION: The patient will continue with gentle hydration post procedure with close follow-up of renal function. Continue DAPT for minimum of 1 year.  Diagnostic Dominance: Left  Intervention     Patient Profile     60 y.o. male with a history of CKD stage V not yet on dialysis, hypertension, type 2 diabetes mellitus, and prior CVA who was admitted with NSTEMI and acute CHF after presenting with dyspnea.  Assessment & Plan    NSTEMI(presenting as acute CHF symptoms and not angina); CADinvolving native coronary artery with unstable angina - High-sensitivity troponin peaked at 7,421. - Echo showed LVEF of 45% with hypokinesis of there anteroseptal wall with  entire apex being hypokinetic. - R/LHC on 7/19 showed 90% calcified stenosis of proximal LAD, 30% stenosis followed by 80% stenosis of LPDA, 20% stenosis followed by 80% stenosis of 2nd Marginal. Mildly elevated right heart pressures with PA pressure of 34/19 and mean PA pressure of 26mmg.  - No angina.  - Continue Aspirin, beta-blocker, and high-intensity statin. Continue Imdur. - Patient underwent coronary atherectomy of LAD lesion yesterday because he did not want to have bypass surgery.   Acute on Chronic Combined CHF - Echo as above. - Relatively normal right heart cath pressures on 7/19/ - Initially started on IV Lasix. Last dose 03/25/2020. Net negative 3.5 L since admission. Weight down 5lbs since admission. - Appears euvolemic on exam. No need for additional diuretics at this time. -Currently on Toprol-Xl 25mg  daily ->  with blood pressure still elevated, can increase to 50 mg - No ACEi/ARB given renal function. - Continue to monitor daily weights, strict I/O's, and renal function.  Hypertension - BP mildly elevated at times but improved with uptitration of Hydralazine and Imdur yesterday. - Continue Hydralazine 100mg  TID (increased yesterday to 100 mg) and Imdur 30mg  daily.  - Increase Toprol-XL to 50 mg as above.  CKD Stage V - Has been preparing for dialysis as outpatient with AV fistula in place but not quite ready to use.  - Creatinine slightly higher overnight - 7.52 - Nephrology following.  Acute on Chronic Anemia - Likely secondary to CKD. - Hemoglobin trending down 9.7 >> 9.5 >> 8.5 >> 7.7 >> 7.7 >> 7.0. - Plan was for 1 unit of PRBC and Feraheme yesterday per Nephrology but looks like he only got Feraheme. Hemoglobin stable and no signs of active bleed. So will defer need for PRBC to Nephrology and primary team.  Deconditioned: - Patient very deconditioned.  - PT consulted and recommended CIR at discharge.  For questions or updates, please contact [place] Please consult [email or web address] for contact info under   Pixie Casino, MD, FACC, [place] Director of the [facility] Diplomate of the American Board of Clinical Lipidology Attending Cardiologist  Direct Dial: [phone]  Fax: [phone]  Website:  [email or web address]  Pixie Casino, MD  03/31/2020, 10:13 AM

## 2020-04-01 DIAGNOSIS — I214 Non-ST elevation (NSTEMI) myocardial infarction: Secondary | ICD-10-CM | POA: Diagnosis not present

## 2020-04-01 DIAGNOSIS — E875 Hyperkalemia: Secondary | ICD-10-CM | POA: Diagnosis not present

## 2020-04-01 DIAGNOSIS — E8770 Fluid overload, unspecified: Secondary | ICD-10-CM | POA: Diagnosis not present

## 2020-04-01 DIAGNOSIS — N185 Chronic kidney disease, stage 5: Secondary | ICD-10-CM | POA: Diagnosis not present

## 2020-04-01 LAB — RENAL FUNCTION PANEL
Albumin: 2.5 g/dL — ABNORMAL LOW (ref 3.5–5.0)
Anion gap: 12 (ref 5–15)
BUN: 55 mg/dL — ABNORMAL HIGH (ref 8–23)
CO2: 21 mmol/L — ABNORMAL LOW (ref 22–32)
Calcium: 8.7 mg/dL — ABNORMAL LOW (ref 8.9–10.3)
Chloride: 107 mmol/L (ref 98–111)
Creatinine, Ser: 8.04 mg/dL — ABNORMAL HIGH (ref 0.61–1.24)
GFR calc Af Amer: 8 mL/min — ABNORMAL LOW (ref >60)
GFR calc non Af Amer: 7 mL/min — ABNORMAL LOW (ref >60)
Glucose, Bld: 158 mg/dL — ABNORMAL HIGH (ref 70–99)
Phosphorus: 6.8 mg/dL — ABNORMAL HIGH (ref 2.5–4.6)
Potassium: 3.9 mmol/L (ref 3.5–5.1)
Sodium: 140 mmol/L (ref 135–145)

## 2020-04-01 LAB — BPAM RBC
Blood Product Expiration Date: 202108242359
ISSUE DATE / TIME: 202107241333
Unit Type and Rh: 5100

## 2020-04-01 LAB — TYPE AND SCREEN
ABO/RH(D): O POS
Antibody Screen: NEGATIVE
Unit division: 0

## 2020-04-01 LAB — MAGNESIUM: Magnesium: 2.4 mg/dL (ref 1.7–2.4)

## 2020-04-01 LAB — CBC
HCT: 25.6 % — ABNORMAL LOW (ref 39.0–52.0)
Hemoglobin: 8.3 g/dL — ABNORMAL LOW (ref 13.0–17.0)
MCH: 30.3 pg (ref 26.0–34.0)
MCHC: 32.4 g/dL (ref 30.0–36.0)
MCV: 93.4 fL (ref 80.0–100.0)
Platelets: 222 10*3/uL (ref 150–400)
RBC: 2.74 MIL/uL — ABNORMAL LOW (ref 4.22–5.81)
RDW: 13.8 % (ref 11.5–15.5)
WBC: 6.6 10*3/uL (ref 4.0–10.5)
nRBC: 0 % (ref 0.0–0.2)

## 2020-04-01 LAB — GLUCOSE, CAPILLARY
Glucose-Capillary: 129 mg/dL — ABNORMAL HIGH (ref 70–99)
Glucose-Capillary: 200 mg/dL — ABNORMAL HIGH (ref 70–99)
Glucose-Capillary: 201 mg/dL — ABNORMAL HIGH (ref 70–99)
Glucose-Capillary: 139 mg/dL — ABNORMAL HIGH (ref 70–99)

## 2020-04-01 MED ORDER — CHLORHEXIDINE GLUCONATE CLOTH 2 % EX PADS
6.0000 | MEDICATED_PAD | Freq: Every day | CUTANEOUS | Status: DC
  Administered 2020-04-02 – 2020-04-06 (×5): 6 via TOPICAL

## 2020-04-01 MED ORDER — CEFAZOLIN SODIUM-DEXTROSE 2-4 GM/100ML-% IV SOLN
2.0000 g | INTRAVENOUS | Status: AC
  Filled 2020-04-01: qty 100

## 2020-04-01 MED ORDER — PROMETHAZINE HCL 25 MG/ML IJ SOLN
12.5000 mg | Freq: Four times a day (QID) | INTRAMUSCULAR | Status: DC | PRN
  Administered 2020-04-01: 12.5 mg via INTRAVENOUS
  Filled 2020-04-01: qty 1

## 2020-04-01 MED ORDER — LORATADINE 10 MG PO TABS
10.0000 mg | ORAL_TABLET | Freq: Every day | ORAL | Status: DC
  Administered 2020-04-01 – 2020-04-06 (×6): 10 mg via ORAL
  Filled 2020-04-01 (×6): qty 1

## 2020-04-01 NOTE — H&P (Signed)
Chief Complaint: Patient was seen in consultation today for a tunneled dialysis catheter.  Referring Physician(s): Dr. Candiss Norse  Supervising Physician: Arne Cleveland  Patient Status: [facility] - In-pt  History of Present Illness: William Schroeder is a 59 y.o. male with a medical history that includes a stroke, HTN, DM, gastroparesis and CKD stage 5. He presented to the ED 03/23/20 for shortness of breath. He was admitted for a NSTEMI (is now s/p LAD DES 03/28/20), acute hypoxic respiratory failure with pulmonary edema, and acute kidney injury with hyperkalemia.   His kidney function has continued to worsen and the nephrology team would like to initiate hemodialysis soon. The patient has a LUA AVF but it is not mature.   Interventional Radiology has been asked to evaluate this patient for the placement of a tunneled dialysis catheter.   Past Medical History:  Diagnosis Date  . Arthritis   . Carpal tunnel syndrome 10/18/2014   Bilateral  . Chronic leg pain    since last back surgery  . DDD (degenerative disc disease), lumbar   . Depression   . Diabetes mellitus    Type II  . Gastroparesis   . GERD (gastroesophageal reflux disease)   . Headache   . Hyperlipidemia   . Hypertension   . Neuropathy   . Stroke [facility]) March , July 2017   right arm    Past Surgical History:  Procedure Laterality Date  . ANKLE FRACTURE SURGERY Left   . BACK SURGERY     two, last one 06/2011  . CATARACT EXTRACTION W/PHACO Right 01/28/2016   Procedure: CATARACT EXTRACTION PHACO AND INTRAOCULAR LENS PLACEMENT (IOC);  Surgeon: Tonny Branch, MD;  Location: [facility];  Service: Ophthalmology;  Laterality: Right;  CDE: 6.18  . CHOLECYSTECTOMY    . COLONOSCOPY  04/2009   Dr. Hulda Humphrey diverticulum at ascending colon  . COLONOSCOPY WITH ESOPHAGOGASTRODUODENOSCOPY (EGD)  08/18/2012   Dr. Gala Romney: TCS with few colonic diverticulosis, , EGD with short segment biopsy-proven Barrett's, gastric and bulbar erosions.  Negative H.pylori. Low-grade dysplasia of Barrett's.   . CORONARY ATHERECTOMY N/A 03/28/2020   Procedure: CORONARY ATHERECTOMY;  Surgeon: Troy Sine, MD;  Location: [place] CV [facility];  Service: Cardiovascular;  Laterality: N/A;  . ESOPHAGOGASTRODUODENOSCOPY  04/2009   Dr. Rehman-->Versed 8mg IV/Demerol 50mg IV. erosive reflux esophagitis with single patch of salmon colored mucosa at distal esophagus. bx-no definite Barrett's  . GAS/FLUID EXCHANGE Left 12/07/2018   Procedure: Gas/Fluid Exchange;  Surgeon: Jalene Mullet, MD;  Location: [place];  Service: Ophthalmology;  Laterality: Left;  . LUMBAR FUSION  12/16/2017  . MEMBRANE PEEL Left 12/07/2018   Procedure: Antoine Primas;  Surgeon: Jalene Mullet, MD;  Location: [place];  Service: Ophthalmology;  Laterality: Left;  . PHOTOCOAGULATION WITH LASER Left 12/07/2018   Procedure: Photocoagulation With Laser;  Surgeon: Jalene Mullet, MD;  Location: [place];  Service: Ophthalmology;  Laterality: Left;  . 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 RIGHT/LEFT HEART CATH AND CORONARY ANGIOGRAPHY N/A 03/26/2020   Procedure: RIGHT/LEFT HEART CATH AND CORONARY ANGIOGRAPHY;  Surgeon: Troy Sine, MD;  Location: [place] [facility];  Service: Cardiovascular;  Laterality: N/A;  . STERIOD INJECTION  01/16/2012   Procedure: MINOR STEROID INJECTION;  Surgeon: Lowella Grip, MD;  Location: [place];  Service: Orthopedics;  Laterality: Right;  Epidural Steroid Injection  Right Lumbar 3-4 and Right Lumbar 5 - Sacral 1    Allergies: Patient has no known allergies.  Medications: Prior to Admission medications   Medication Sig Start Date End Date Taking? Authorizing Provider  amLODipine (NORVASC) 5 MG tablet Take 5 mg by mouth in the morning.    Yes  [provider]  aspirin 325 MG tablet Take 325 mg by mouth in the morning.    Yes [provider]  atorvastatin (LIPITOR) 80 MG tablet Take 80 mg by mouth at bedtime.    Yes [provider]  cholecalciferol (VITAMIN D) 25 MCG (1000 UNIT) tablet Take 1,000 Units by mouth in the morning.   Yes [provider]  ferrous gluconate (FERGON) 324 MG tablet Take 324 mg by mouth daily with breakfast.   Yes [provider]  folic acid (FOLVITE) 1 MG tablet Take 1 mg by mouth in the morning.   Yes [provider]  hydrALAZINE (APRESOLINE) 50 MG tablet Take 50 mg by mouth 3 (three) times daily.   Yes [provider]  insulin glargine (LANTUS) 100 UNIT/ML injection Inject 0.62 mLs (62 Units total) into the skin at bedtime. Patient taking differently: Inject 20 Units into the skin at bedtime.  07/12/14  Yes Niel Hummer, NP  metoprolol tartrate (LOPRESSOR) 50 MG tablet Take 50 mg by mouth 2 (two) times daily.   Yes [provider]  Omega-3 Fatty Acids (FISH OIL) 1000 MG CAPS Take 1,000 mg by mouth 2 (two) times daily.   Yes [provider]  QUEtiapine (SEROQUEL) 25 MG tablet Take 25 mg by mouth at bedtime. 06/14/19  Yes [provider]  thiamine (VITAMIN B-1) 100 MG tablet Take 100 mg by mouth in the morning.   Yes [provider]  DULoxetine (CYMBALTA) 60 MG capsule Take 1 capsule (60 mg total) by mouth daily. Patient not taking: Reported on 03/26/2020 03/27/15   Cloria Spring, MD  lisinopril (PRINIVIL,ZESTRIL) 20 MG tablet Take 1 tablet (20 mg total) by mouth daily. Patient not taking: Reported on 03/26/2020 07/12/14   Niel Hummer, NP     Family History  Problem Relation Age of Onset  . Colon cancer Other        uncle  . Pancreatic cancer Sister   . Depression Sister   . Pancreatic cancer Brother   . Depression Brother   . Drug abuse Brother   . Diabetes Mother   . Heart disease Mother   . Kidney  disease Mother   . Diabetes Other        9 siblings  . Alcohol abuse Brother     Social History   Socioeconomic History  . Marital status: Legally Separated    Spouse name: Not on file  . Number of children: 2  . Years of education: Not on file  . Highest education level: Not on file  Occupational History  . Occupation: saw Programmer, multimedia: [employer]  Tobacco Use  . Smoking status: Former Smoker    Packs/day: 1.00    Years: 20.00    Pack years: 20.00    Types: Cigarettes    Quit date: 12/31/1988    Years since quitting: 31.2  . Smokeless tobacco: Current User    Types: Snuff  . Tobacco comment: 12/06/2018 1 can of snuff a day  Vaping Use  . Vaping Use: Never used  Substance and Sexual Activity  . Alcohol use:  No  . Drug use: No  . Sexual activity: Not Currently    Birth control/protection: None  Other Topics Concern  . Not on file  Social History Narrative  . Not on file   Social Determinants of Health   Financial Resource Strain:   . Difficulty of Paying Living Expenses:   Food Insecurity:   . Worried About Charity fundraiser in the Last Year:   . Arboriculturist in the Last Year:   Transportation Needs:   . Film/video editor (Medical):   Marland Kitchen Lack of Transportation (Non-Medical):   Physical Activity:   . Days of Exercise per Week:   . Minutes of Exercise per Session:   Stress:   . Feeling of Stress :   Social Connections:   . Frequency of Communication with Friends and Family:   . Frequency of Social Gatherings with Friends and Family:   . Attends Religious Services:   . Active Member of Clubs or Organizations:   . Attends Archivist Meetings:   Marland Kitchen Marital Status:     Review of Systems: A 12 point ROS discussed and pertinent positives are indicated in the HPI above.  All other systems are negative.  Review of Systems  Constitutional: Positive for appetite change and fatigue.  Respiratory: Negative for shortness of  breath.   Cardiovascular: Negative for chest pain and leg swelling.  Gastrointestinal: Positive for nausea and vomiting. Negative for abdominal pain and diarrhea.       Patient had N/V on Saturday   Genitourinary: Positive for decreased urine volume.  Musculoskeletal: Positive for back pain.       History of back pain/surgeries  Neurological: Negative for headaches.    Vital Signs: BP (!) 168/72 (BP Location: Right Arm)   Pulse 75   Temp 98 F (36.7 C) (Oral)   Resp 16   Ht 5\' 10"  (1.778 m)   Wt (!) 225 lb 5 oz (102.2 kg) Comment: bed weight due to meds previously taken - RN aware  SpO2 96%   BMI 32.33 kg/m   Physical Exam Constitutional:      General: He is not in acute distress. HENT:     Mouth/Throat:     Mouth: Mucous membranes are moist.     Pharynx: Oropharynx is clear.  Cardiovascular:     Rate and Rhythm: Normal rate and regular rhythm.     Pulses: Normal pulses.     Heart sounds: Normal heart sounds.  Pulmonary:     Effort: Pulmonary effort is normal.     Breath sounds: Normal breath sounds.  Abdominal:     General: Bowel sounds are normal.     Palpations: Abdomen is soft.  Genitourinary:    Comments: oliguric Musculoskeletal:        General: Normal range of motion.  Skin:    General: Skin is warm and dry.     Comments: Left arm AVF  Neurological:     Mental Status: He is alert and oriented to person, place, and time.     Imaging: CARDIAC CATHETERIZATION  Result Date: 03/28/2020  1st Diag lesion is 60% stenosed.  Mid LAD lesion is 50% stenosed.  Prox LAD lesion is 95% stenosed.  2nd Mrg-1 lesion is 20% stenosed.  2nd Mrg-2 lesion is 80% stenosed.  LPDA-1 lesion is 30% stenosed.  LPDA-2 lesion is 80% stenosed.  Post intervention, there is a 0% residual stenosis.  A stent was successfully placed.  Successful complex  coronary intervention with diamondback orbital atherectomy and ultimate DES stenting of the 95% severely calcified proximal LAD  stenosis with ultimate insertion of a 2.75 x 15 mm Resolute Onyx stent postdilated to 3.0 mm.  The 95% stenosis was reduced to 0%.  There was brisk TIMI-3 flow.  There was no evidence for dissection. RECOMMENDATION: The patient will continue with gentle hydration post procedure with close follow-up of renal function.  Continue DAPT for minimum of 1 year.   CARDIAC CATHETERIZATION  Result Date: 03/26/2020  2nd Mrg-1 lesion is 20% stenosed.  2nd Mrg-2 lesion is 80% stenosed.  LPDA-1 lesion is 30% stenosed.  LPDA-2 lesion is 80% stenosed.  1st Diag lesion is 30% stenosed.  Mid LAD lesion is 30% stenosed.  Prox LAD lesion is 90% stenosed.  Coronary calcification with a 90% calcified proximal LAD stenosis, 30% first diagonal 30% LAD stenosis after the diagonal takeoff; dominant left circumflex coronary artery with calcification in 20 and 80% circumflex marginal stenoses, 30 and 80% PDA stenosis; small nondominant RCA. Mildly elevated right heart pressure with PA pressure at 34/19; mean PA pressure 24 mm. Contrast used: 17 cc RECOMMENDATION: We will need to monitor renal function post catheterization.  Patient will need intervention to his proximal LAD once stable from a renal standpoint.  Aggressive lipid-lowering therapy.   ECHOCARDIOGRAM COMPLETE  Result Date: 03/25/2020    ECHOCARDIOGRAM REPORT   Patient Name:   ASA BAUDOIN Date of Exam: 03/25/2020 Medical Rec #:  [id number]      Height:       70.0 in Accession #:    [id number]     Weight:       222.2 lb Date of Birth:  04-10-1961      BSA:          2.183 m Patient Age:    60 years       BP:           137/67 mmHg Patient Gender: M              HR:           66 bpm. Exam Location:  Inpatient Procedure: 2D Echo Indications:    elevated troponin  History:        Patient has prior history of Echocardiogram examinations, most                 recent 04/12/2013. Stroke; Risk Factors:Hypertension, Diabetes,                 Dyslipidemia and Former Smoker.   Sonographer:    Jannett Celestine RDCS (AE) Referring Phys: [id number] [place]  1. The anteroseptal wall is hypokinetic. The entire apex is hypokinetic. Marland Kitchen Left ventricular ejection fraction, by estimation, is 45%. The left ventricle has mildly decreased function. The left ventricle demonstrates regional wall motion abnormalities (see scoring diagram/findings for description). There is mild left ventricular hypertrophy. Left ventricular diastolic parameters are indeterminate.  2. Right ventricular systolic function is normal. The right ventricular size is normal.  3. The mitral valve is normal in structure. No evidence of mitral valve regurgitation. No evidence of mitral stenosis.  4. The aortic valve is tricuspid. Aortic valve regurgitation is not visualized. No aortic stenosis is present. FINDINGS  Left Ventricle: The anteroseptal wall is hypokinetic. The entire apex is hypokinetic. Left ventricular ejection fraction, by estimation, is 45%. The left ventricle has mildly decreased function. The left ventricle demonstrates regional wall motion abnormalities. The left ventricular  internal cavity size was normal in size. There is mild left ventricular hypertrophy. Left ventricular diastolic parameters are indeterminate. Right Ventricle: The right ventricular size is normal. No increase in right ventricular wall thickness. Right ventricular systolic function is normal. Left Atrium: Left atrial size was normal in size. Right Atrium: Right atrial size was normal in size. Pericardium: There is no evidence of pericardial effusion. Mitral Valve: The mitral valve is normal in structure. No evidence of mitral valve regurgitation. No evidence of mitral valve stenosis. Tricuspid Valve: The tricuspid valve is not well visualized. Tricuspid valve regurgitation is trivial. No evidence of tricuspid stenosis. Aortic Valve: The aortic valve is tricuspid. Aortic valve regurgitation is not visualized. No aortic stenosis is  present. Pulmonic Valve: The pulmonic valve was not well visualized. Pulmonic valve regurgitation is not visualized. No evidence of pulmonic stenosis. Aorta: The aortic root is normal in size and structure. Pulmonary Artery: Indeterminant PASP, inadequate TR jet. Venous: The inferior vena cava was not well visualized. IAS/Shunts: The interatrial septum was not well visualized.  LEFT VENTRICLE PLAX 2D LVIDd:         5.30 cm  Diastology LVIDs:         3.60 cm  LV e' lateral:   7.94 cm/s LV PW:         1.20 cm  LV E/e' lateral: 9.1 LV IVS:        1.10 cm  LV e' medial:    6.42 cm/s LVOT diam:     2.20 cm  LV E/e' medial:  11.2 LV SV:         75 LV SV Index:   34 LVOT Area:     3.80 cm  LEFT ATRIUM             Index       RIGHT ATRIUM           Index LA diam:        4.60 cm 2.11 cm/m  RA Area:     18.10 cm LA Vol (A2C):   61.5 ml 28.17 ml/m RA Volume:   43.90 ml  20.11 ml/m LA Vol (A4C):   58.2 ml 26.66 ml/m LA Biplane Vol: 59.7 ml 27.35 ml/m  AORTIC VALVE LVOT Vmax:   93.10 cm/s LVOT Vmean:  73.000 cm/s LVOT VTI:    0.197 m  AORTA Ao Root diam: 3.20 cm MITRAL VALVE MV Area (PHT): 3.21 cm    SHUNTS MV Decel Time: 236 msec    Systemic VTI:  0.20 m MV E velocity: 72.00 cm/s  Systemic Diam: 2.20 cm MV A velocity: 78.80 cm/s MV E/A ratio:  0.91 Carlyle Dolly MD Electronically signed by Carlyle Dolly MD Signature Date/Time: 03/25/2020/10:18:33 AM    Final     Labs:  CBC: Recent Labs    03/29/20 0405 03/30/20 0513 03/31/20 0541 04/01/20 0815  WBC 7.0 6.5 6.2 6.6  HGB 7.7* 7.7* 7.0* 8.3*  HCT 24.2* 23.9* 22.0* 25.6*  PLT 191 196 201 222    COAGS: Recent Labs    03/24/20 1523  INR 1.4*  APTT >200*    BMP: Recent Labs    03/29/20 0405 03/30/20 0513 03/31/20 0541 04/01/20 0815  NA 136 137 137 140  K 4.8 4.4 4.0 3.9  CL 108 108 106 107  CO2 16* 19* 19* 21*  GLUCOSE 142* 142* 138* 158*  BUN 50* 54* 56* 55*  CALCIUM 8.6* 8.9 8.8* 8.7*  CREATININE 6.00* 6.44* 7.52* 8.04*  GFRNONAA  9* 9* 7* 7*  GFRAA 11* 10* 8* 8*    LIVER FUNCTION TESTS: Recent Labs    03/30/20 0513 03/31/20 0541 04/01/20 0815  ALBUMIN 2.4* 2.4* 2.5*    Assessment and Plan:  End Stage Renal Disease: Marquez Ceesay. Camerer, 59 year old male, is scheduled 04/02/20 for a tunneled dialysis cathter for the initiation of hemodialysis. He has known kidney disease that has progressively worsened. Today's BUN/Creatinine levels are 55/8.04.   Risks and benefits discussed with the patient including, but not limited to bleeding, infection, vascular injury, pneumothorax which may require chest tube placement, air embolism or even death  All of the patient's questions were answered, patient is agreeable to proceed.  He has orders to be NPO after midnight and to hold his subcutaneous heparin tomorrow until after his procedure.   Consent signed and in chart.   Thank you for this interesting consult.  I greatly enjoyed meeting GREGROY DOMBKOWSKI and look forward to participating in their care.  A copy of this report was sent to the requesting provider on this date.  Electronically Signed: Soyla Dryer, AGACNP-BC [phone] 04/01/2020, 12:23 PM   I spent a total of 20 Minutes    in face to face in clinical consultation, greater than 50% of which was counseling/coordinating care for tunneled dialysis catheter.

## 2020-04-01 NOTE — Progress Notes (Signed)
PROGRESS NOTE    William Schroeder  EHM:[id number] DOB: Jun 29, 1961 DOA: 03/23/2020 PCP: [facility]   Brief Narrative:   William Tony Danielsis a 59 y.o.malewith medical history significant forinsulin-dependent diabetes mellitus, history of CVA, hypertension, and chronic kidney disease stage V, now presenting to the emergency department for evaluation of shortness of breath. Patient reports some chronic dyspnea and notes that he typically sleeps in a recliner because of shortness of breath and back pain when he tries to lay flat.His shortness of breath was worse than usual on 03/22/2020, he tried to sleep been a bed that night, but developed severe worsening in his dyspnea and felt as though he cannot catch his breath even when he sat back up. His ex-wife was with him, noted that he appeared pale and struggling to breathe, and EMS was called. Patient denies any cough, denies fevers or chills, and has not experienced any chest discomfort or palpitations associated with this. He denies any leg swelling but reports that his abdomen was swollen recently.  7/25: To have tunneled-cath for temp HD. Dialysis tomorrow. BP up. Titrate meds. HD likely to help.   Assessment & Plan:   Principal Problem:   Hypervolemia associated with renal insufficiency Active Problems:   Diabetes mellitus, type 2 (HCC)   Hypertension   CKD (chronic kidney disease), stage V (HCC)   Acute respiratory failure with hypoxia (HCC)   Non-STEMI (non-ST elevated myocardial infarction) (HCC)   Hyperkalemia  NSTEMI - patient presented with worsening shortness of breath dyspnea on exertion chest x-ray with pulmonary edema elevated BNP and increased elevated troponins. - LHC 03/26/2020: Coronary calcification with a 90% calcified proximal LAD stenosis, 30% first diagonal 30% LAD stenosis after the diagonal takeoff; dominant left circumflex coronary artery with calcification in 20 and 80% circumflex marginal  stenoses, 30 and 80% PDA stenosis; small nondominant RCA. - s/p coronary atherectomy to LAD - cards has s/o'd: continue ASA/plavix/statin  AKI on CKD stage V Metabolic acidosis Hyperphosphatemia - follow up SCr; nephro onboard, appreciate assistance -onbicarb 1300mg  TID     - Scr is up again. Spoke with nephro. Will get tunneled cath, start HD tomorrow  Hx of type 2 diabetes - A1c: 7.2 - SSI , DM diet     - 7/25: glucose is acceptable.   Hx of stroke  - on aspirin and statin.  acute heart failure with pulmonary edema - Patient received high dose Lasix now on hold for 24 hours prior to cath. - Patient had a cath 03/26/2020.  - Echocardiogram 03/24/2020 ejection fraction 45% anteroseptal wall is hypokinetic entire apex is hypokinetic left ventricle demonstrates regional wall motion abnormalities mild LVH. Right ventricular systolic function normal. - Cardiology onboard -Will defer to nephrology when to restart Lasix. - toprol, imdur  anemia of chronic renal disease - Hgb7.0today; no evidence of bleed, follow -7/25: Hgb is up to 8.3. Stable.   essential hypertension - continue toprol, imdur, hydralazine; titrate as needed  Hx of hyperlipidemia - on Zocor  Hx of tobacco abuse  - continue nicotine patch     - continues to chew dip in room; advised against continuing this behavior   DVT prophylaxis: heparin Code Status: FULL Family Communication: None at bedside.    Status is: Inpatient  Remains inpatient appropriate because:Inpatient level of care appropriate due to severity of illness   Dispo: The patient is from: Home              Anticipated d/c is to: Home  Anticipated d/c date is: 2 days              Patient currently is not medically stable to d/c.  Consultants:   Cardiology  Nephrology  Procedures:   LHC, atherectomy  ROS:  Denies CP, N, V, dyspnea .  Remainder 10-pt ROS is negative for all not previously mentioned.  Subjective: No acute events ON.   Objective: Vitals:   03/31/20 2100 04/01/20 0042 04/01/20 0538 04/01/20 1118  BP: (!) 140/98 (!) 126/59 (!) 147/60 (!) 168/72  Pulse: 74 69 70 75  Resp: 16 20 18 16   Temp: 98.2 F (36.8 C) 97.9 F (36.6 C) 98.1 F (36.7 C) 98 F (36.7 C)  TempSrc: Oral Oral Oral Oral  SpO2: 98% 95% 100% 96%  Weight:   (!) 102.2 kg   Height:        Intake/Output Summary (Last 24 hours) at 04/01/2020 1350 Last data filed at 04/01/2020 0538 Gross per 24 hour  Intake 412 ml  Output 425 ml  Net -13 ml   Filed Weights   03/29/20 0449 03/31/20 0425 04/01/20 0538  Weight: 98.6 kg (!) 103.1 kg (!) 102.2 kg    Examination:  General: 59 y.o. male resting in bed in NAD Cardiovascular: RRR, +S1, S2, no m/g/r Respiratory: CTABL, no w/r/r, normal WOB GI: BS+, NDNT, no masses noted, no organomegaly noted MSK: No e/c/c Neuro: Alert to name, follows commands Psyc: Appropriate interaction and affect, calm/cooperative   Data Reviewed: I have personally reviewed following labs and imaging studies.  CBC: Recent Labs  Lab 03/28/20 0520 03/29/20 0405 03/30/20 0513 03/31/20 0541 04/01/20 0815  WBC 7.2 7.0 6.5 6.2 6.6  NEUTROABS  --   --  3.3 3.3  --   HGB 8.5* 7.7* 7.7* 7.0* 8.3*  HCT 26.7* 24.2* 23.9* 22.0* 25.6*  MCV 93.0 92.7 93.7 95.2 93.4  PLT 196 191 196 201 599   Basic Metabolic Panel: Recent Labs  Lab 03/28/20 0520 03/29/20 0405 03/30/20 0513 03/31/20 0541 04/01/20 0815  NA 138 136 137 137 140  K 4.5 4.8 4.4 4.0 3.9  CL 107 108 108 106 107  CO2 20* 16* 19* 19* 21*  GLUCOSE 155* 142* 142* 138* 158*  BUN 49* 50* 54* 56* 55*  CREATININE 6.27* 6.00* 6.44* 7.52* 8.04*  CALCIUM 9.0 8.6* 8.9 8.8* 8.7*  MG  --  2.1 2.4 2.4 2.4  PHOS  --   --  6.0* 6.7* 6.8*   GFR: Estimated Creatinine Clearance: 11.6 mL/min (A) (by C-G formula based on SCr of 8.04 mg/dL (H)). Liver Function  Tests: Recent Labs  Lab 03/30/20 0513 03/31/20 0541 04/01/20 0815  ALBUMIN 2.4* 2.4* 2.5*   No results for input(s): LIPASE, AMYLASE in the last 168 hours. No results for input(s): AMMONIA in the last 168 hours. Coagulation Profile: No results for input(s): INR, PROTIME in the last 168 hours. Cardiac Enzymes: No results for input(s): CKTOTAL, CKMB, CKMBINDEX, TROPONINI in the last 168 hours. BNP (last 3 results) No results for input(s): PROBNP in the last 8760 hours. HbA1C: No results for input(s): HGBA1C in the last 72 hours. CBG: Recent Labs  Lab 03/31/20 1131 03/31/20 1627 03/31/20 2107 04/01/20 0759 04/01/20 1116  GLUCAP 152* 178* 140* 129* 200*   Lipid Profile: No results for input(s): CHOL, HDL, LDLCALC, TRIG, CHOLHDL, LDLDIRECT in the last 72 hours. Thyroid Function Tests: No results for input(s): TSH, T4TOTAL, FREET4, T3FREE, THYROIDAB in the last 72 hours. Anemia Panel: No results for  input(s): VITAMINB12, FOLATE, FERRITIN, TIBC, IRON, RETICCTPCT in the last 72 hours. Sepsis Labs: No results for input(s): PROCALCITON, LATICACIDVEN in the last 168 hours.  No results found for this or any previous visit (from the past 240 hour(s)).    Radiology Studies: No results found.   Scheduled Meds: . aspirin  81 mg Oral Daily  . atorvastatin  80 mg Oral Daily  . clopidogrel  75 mg Oral Q breakfast  . heparin  5,000 Units Subcutaneous Q8H  . hydrALAZINE  100 mg Oral TID  . insulin aspart  0-6 Units Subcutaneous TID WC  . isosorbide mononitrate  30 mg Oral Daily  . metoprolol succinate  50 mg Oral Daily  . nicotine  14 mg Transdermal Daily  . sodium bicarbonate  1,300 mg Oral TID  . sodium chloride flush  3 mL Intravenous Q12H   Continuous Infusions: . sodium chloride    . sodium chloride    . [START ON 04/02/2020]  ceFAZolin (ANCEF) IV    . ferumoxytol 510 mg (03/29/20 1010)     LOS: 9 days    Time spent: 25 minutes spent in the coordination of care  today.    Jonnie Finner, DO [facility]  If 7PM-7AM, please contact night-coverage [email or web address] 04/01/2020, 1:50 PM

## 2020-04-01 NOTE — Progress Notes (Signed)
   No active cardiac issues -hemoglobin improved with transfusion. No further suggestions at this time.  Pixie Casino, MD, [facility] [place], [place] Director of the [facility]  [facility] Diplomate of the American Board of Clinical Lipidology Attending Cardiologist  Direct Dial: [phone]  Fax: [phone]  Website:  [email or web address]

## 2020-04-01 NOTE — Progress Notes (Signed)
Admit: 03/23/2020 LOS: 9  44M progresive CKD5 with NSTEMI  Subjective:  Went back to his room yesterday evening when the nurse told me that he actually has been vomiting and has been nausea (he had always denied this with me). Family was there at the bedside who said that he typically does have these symptoms rarely due to gastroparesis and this is relatively new. No acute events overnight. Still has some nausea. No SOB/swelling.  07/24 0701 - 07/25 0700 In: 422 [I.V.:10; Blood:412] Out: 425 [Urine:425]  Filed Weights   03/29/20 0449 03/31/20 0425 04/01/20 0538  Weight: 98.6 kg (!) 103.1 kg (!) 102.2 kg    Scheduled Meds:  aspirin  81 mg Oral Daily   atorvastatin  80 mg Oral Daily   clopidogrel  75 mg Oral Q breakfast   heparin  5,000 Units Subcutaneous Q8H   hydrALAZINE  100 mg Oral TID   insulin aspart  0-6 Units Subcutaneous TID WC   isosorbide mononitrate  30 mg Oral Daily   metoprolol succinate  50 mg Oral Daily   nicotine  14 mg Transdermal Daily   sodium bicarbonate  1,300 mg Oral TID   sodium chloride flush  3 mL Intravenous Q12H   Continuous Infusions:  sodium chloride     sodium chloride     ferumoxytol 510 mg (03/29/20 1010)   PRN Meds:.sodium chloride, sodium chloride, acetaminophen, diazepam, loperamide, ondansetron (ZOFRAN) IV, sodium chloride flush  Current Labs: reviewed  Results for ZUBIN, PONTILLO (MRN [id number]) as of 03/25/2020 10:20  Ref. Range 03/24/2020 06:15  Saturation Ratios Latest Ref Range: 17.9 - 39.5 % 36  Ferritin Latest Ref Range: 24 - 336 ng/mL 26    Physical Exam:  Blood pressure (!) 147/60, pulse 70, temperature 98.1 F (36.7 C), temperature source Oral, resp. rate 18, height 5\' 10"  (1.778 m), weight (!) 102.2 kg, SpO2 100 %. GEN: NAD. Sitting up in chair ENT: NCAT EYES: EOMI CV: RRR no rub PULM: normal wob ABD: s/nt/nd SKIN: no rashes/lesions; LUE AVF incision well healed EXT:no sig LEE Neuro: speech clear and  coherent, no asterixis LUE AVF +B/T   Recent Labs  Lab 03/30/20 0513 03/31/20 0541 04/01/20 0815  NA 137 137 140  K 4.4 4.0 3.9  CL 108 106 107  CO2 19* 19* 21*  GLUCOSE 142* 138* 158*  BUN 54* 56* 55*  CREATININE 6.44* 7.52* 8.04*  CALCIUM 8.9 8.8* 8.7*  PHOS 6.0* 6.7* 6.8*   Recent Labs  Lab 03/30/20 0513 03/31/20 0541 04/01/20 0815  WBC 6.5 6.2 6.6  NEUTROABS 3.3 3.3  --   HGB 7.7* 7.0* 8.3*  HCT 23.9* 22.0* 25.6*  MCV 93.7 95.2 93.4  PLT 196 201 222      A 1. 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 2. SOB / Hypoxia / Pulm Edema, improved 3. NSTEMI s/p LAD DES 03/28/20 (160mL contrast utilized) 4. LUE AVF +B/T, healing well 5. DM2 6. HTN 7. Anemia of chronic kidney disease. 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 Metabolic acidosis secondary to #1, improved  P  Start diuresis if signs of hypervolemia.  Still overall euvolemic on exam today, can still hold off for now  Concern that these symptoms are early uremic symptoms and along with his trajectory of renal function currently will start him on HD. IR consulted for tunneled line  placement, will tentatively plan for IHD#1 treatment tomorrow (slow start protocol). avf not ready to be used  Patient and family agreed with this plan  Hold on starting RASS blockade for now  Increased sodium bicarbonate to 1300 mg 3 times daily--continue this for now until dialysis has been started, anticipate this being stopped once steady on IHD  Renal diet  Daily weights, Daily Renal Panel, Strict I/Os, Avoid nephrotoxins (NSAIDs, judicious IV Contrast)    Discussed with primary service.  Gean Quint, MD [facility]

## 2020-04-02 ENCOUNTER — Inpatient Hospital Stay (HOSPITAL_COMMUNITY): Payer: Medicare Other

## 2020-04-02 DIAGNOSIS — N185 Chronic kidney disease, stage 5: Secondary | ICD-10-CM | POA: Diagnosis not present

## 2020-04-02 DIAGNOSIS — J9601 Acute respiratory failure with hypoxia: Secondary | ICD-10-CM | POA: Diagnosis not present

## 2020-04-02 DIAGNOSIS — I214 Non-ST elevation (NSTEMI) myocardial infarction: Secondary | ICD-10-CM | POA: Diagnosis not present

## 2020-04-02 DIAGNOSIS — E8770 Fluid overload, unspecified: Secondary | ICD-10-CM | POA: Diagnosis not present

## 2020-04-02 HISTORY — PX: IR US GUIDE VASC ACCESS RIGHT: IMG2390

## 2020-04-02 HISTORY — PX: IR FLUORO GUIDE CV LINE RIGHT: IMG2283

## 2020-04-02 LAB — RENAL FUNCTION PANEL
Albumin: 2.4 g/dL — ABNORMAL LOW (ref 3.5–5.0)
Anion gap: 14 (ref 5–15)
BUN: 57 mg/dL — ABNORMAL HIGH (ref 8–23)
CO2: 16 mmol/L — ABNORMAL LOW (ref 22–32)
Calcium: 8.4 mg/dL — ABNORMAL LOW (ref 8.9–10.3)
Chloride: 109 mmol/L (ref 98–111)
Creatinine, Ser: 8 mg/dL — ABNORMAL HIGH (ref 0.61–1.24)
GFR calc Af Amer: 8 mL/min — ABNORMAL LOW (ref >60)
GFR calc non Af Amer: 7 mL/min — ABNORMAL LOW (ref >60)
Glucose, Bld: 168 mg/dL — ABNORMAL HIGH (ref 70–99)
Phosphorus: 6.4 mg/dL — ABNORMAL HIGH (ref 2.5–4.6)
Potassium: 4.7 mmol/L (ref 3.5–5.1)
Sodium: 139 mmol/L (ref 135–145)

## 2020-04-02 LAB — CBC WITH DIFFERENTIAL/PLATELET
Abs Immature Granulocytes: 0.05 10*3/uL (ref 0.00–0.07)
Basophils Absolute: 0 10*3/uL (ref 0.0–0.1)
Basophils Relative: 1 %
Eosinophils Absolute: 0.3 10*3/uL (ref 0.0–0.5)
Eosinophils Relative: 4 %
HCT: 23.5 % — ABNORMAL LOW (ref 39.0–52.0)
Hemoglobin: 7.5 g/dL — ABNORMAL LOW (ref 13.0–17.0)
Immature Granulocytes: 1 %
Lymphocytes Relative: 29 %
Lymphs Abs: 1.9 10*3/uL (ref 0.7–4.0)
MCH: 29.9 pg (ref 26.0–34.0)
MCHC: 31.9 g/dL (ref 30.0–36.0)
MCV: 93.6 fL (ref 80.0–100.0)
Monocytes Absolute: 0.8 10*3/uL (ref 0.1–1.0)
Monocytes Relative: 12 %
Neutro Abs: 3.5 10*3/uL (ref 1.7–7.7)
Neutrophils Relative %: 53 %
Platelets: 208 10*3/uL (ref 150–400)
RBC: 2.51 MIL/uL — ABNORMAL LOW (ref 4.22–5.81)
RDW: 13.5 % (ref 11.5–15.5)
WBC: 6.5 10*3/uL (ref 4.0–10.5)
nRBC: 0 % (ref 0.0–0.2)

## 2020-04-02 LAB — GLUCOSE, CAPILLARY
Glucose-Capillary: 189 mg/dL — ABNORMAL HIGH (ref 70–99)
Glucose-Capillary: 130 mg/dL — ABNORMAL HIGH (ref 70–99)
Glucose-Capillary: 234 mg/dL — ABNORMAL HIGH (ref 70–99)

## 2020-04-02 LAB — MAGNESIUM: Magnesium: 2.4 mg/dL (ref 1.7–2.4)

## 2020-04-02 LAB — HEPATITIS B SURFACE ANTIBODY,QUALITATIVE: Hep B S Ab: NONREACTIVE

## 2020-04-02 LAB — HEPATITIS B SURFACE ANTIGEN: Hepatitis B Surface Ag: NONREACTIVE

## 2020-04-02 LAB — HEPATITIS B CORE ANTIBODY, TOTAL: Hep B Core Total Ab: NONREACTIVE

## 2020-04-02 MED ORDER — MIDAZOLAM HCL 2 MG/2ML IJ SOLN
INTRAMUSCULAR | Status: AC
  Filled 2020-04-02: qty 2

## 2020-04-02 MED ORDER — DARBEPOETIN ALFA 60 MCG/0.3ML IJ SOSY
PREFILLED_SYRINGE | INTRAMUSCULAR | Status: AC
  Administered 2020-04-02: 60 ug via INTRAVENOUS
  Filled 2020-04-02: qty 0.3

## 2020-04-02 MED ORDER — FENTANYL CITRATE (PF) 100 MCG/2ML IJ SOLN
INTRAMUSCULAR | Status: AC | PRN
  Administered 2020-04-02: 50 ug via INTRAVENOUS

## 2020-04-02 MED ORDER — SODIUM CHLORIDE 0.9 % IV SOLN
INTRAVENOUS | Status: AC | PRN
  Administered 2020-04-02: 10 mL/h via INTRAVENOUS

## 2020-04-02 MED ORDER — LIDOCAINE HCL 1 % IJ SOLN
INTRAMUSCULAR | Status: AC
  Filled 2020-04-02: qty 20

## 2020-04-02 MED ORDER — GELATIN ABSORBABLE 12-7 MM EX MISC
CUTANEOUS | Status: AC
  Filled 2020-04-02: qty 1

## 2020-04-02 MED ORDER — LIDOCAINE HCL 1 % IJ SOLN
INTRAMUSCULAR | Status: AC | PRN
  Administered 2020-04-02: 10 mL

## 2020-04-02 MED ORDER — HEPARIN SODIUM (PORCINE) 1000 UNIT/ML IJ SOLN
INTRAMUSCULAR | Status: AC | PRN
  Administered 2020-04-02: 3800 [IU] via INTRAVENOUS

## 2020-04-02 MED ORDER — MIDAZOLAM HCL 2 MG/2ML IJ SOLN
INTRAMUSCULAR | Status: AC | PRN
  Administered 2020-04-02: 1 mg via INTRAVENOUS

## 2020-04-02 MED ORDER — DARBEPOETIN ALFA 60 MCG/0.3ML IJ SOSY
60.0000 ug | PREFILLED_SYRINGE | INTRAMUSCULAR | Status: DC
  Filled 2020-04-02: qty 0.3

## 2020-04-02 MED ORDER — HEPARIN SODIUM (PORCINE) 1000 UNIT/ML IJ SOLN
INTRAMUSCULAR | Status: AC
  Filled 2020-04-02: qty 4

## 2020-04-02 MED ORDER — CEFAZOLIN SODIUM-DEXTROSE 2-4 GM/100ML-% IV SOLN
INTRAVENOUS | Status: AC
  Administered 2020-04-02: 2000 mg
  Filled 2020-04-02: qty 100

## 2020-04-02 MED ORDER — HEPARIN SODIUM (PORCINE) 1000 UNIT/ML IJ SOLN
INTRAMUSCULAR | Status: AC
  Filled 2020-04-02: qty 1

## 2020-04-02 MED ORDER — GELATIN ABSORBABLE 12-7 MM EX MISC
CUTANEOUS | Status: AC | PRN
  Administered 2020-04-02: 1 via TOPICAL

## 2020-04-02 MED ORDER — FENTANYL CITRATE (PF) 100 MCG/2ML IJ SOLN
INTRAMUSCULAR | Status: AC
  Filled 2020-04-02: qty 2

## 2020-04-02 NOTE — Progress Notes (Signed)
CARDIAC REHAB PHASE I   PRE:  Rate/Rhythm: 75 SR  BP:  Supine: 138/60  Sitting:   Standing:    SaO2: 99% 3L  MODE:  Ambulation: 125 ft   POST:  Rate/Rhythm: 86 SR  BP:  Supine:   Sitting: 171/77  Standing:    SaO2: 98%RA 1037-1107 Pt walked 125 ft on RA with gait belt use, rolling walker and asst x 2. Pt tired by end of walk when turning to get to recliner and weaker. Needed asst x 2 to stand from sitting position. To recliner with call bell. RN aware. Reinforced importance of plavix with stent. Encouraged smoking cessation. Pt wanting to use dip. Discussed that it has nicotine in it also.   Graylon Good, RN BSN  04/02/2020 11:02 AM

## 2020-04-02 NOTE — Plan of Care (Signed)
MIN ASSIST ADLS. COUNSELED ON NICOTINE USE(DIP).

## 2020-04-02 NOTE — Progress Notes (Signed)
PROGRESS NOTE    William Schroeder  WHQ:[id number] DOB: 09-Jan-1961 DOA: 03/23/2020 [facility]CP: [facility]   Brief Narrative:   William Quebedeaux Danielsis a 59 y.o.malewith medical history significant forinsulin-dependent diabetes mellitus, history of CVA, hypertension, and chronic kidney disease stage V, now presenting to the emergency department for evaluation of shortness of breath. Patient reports some chronic dyspnea and notes that he typically sleeps in a recliner because of shortness of breath and back pain when he tries to lay flat.His shortness of breath was worse than usual on 03/22/2020, he tried to sleep been a bed that night, but developed severe worsening in his dyspnea and felt as though he cannot catch his breath even when he sat back up. His ex-wife was with him, noted that he appeared pale and struggling to breathe, and EMS was called. Patient denies any cough, denies fevers or chills, and has not experienced any chest discomfort or palpitations associated with this. He denies any leg swelling but reports that his abdomen was swollen recently.  7/26: Worsening Scr and acidity today. To have tunneled cath placed and started on HD.   Assessment & Plan:   Principal Problem:   Hypervolemia associated with renal insufficiency Active Problems:   Diabetes mellitus, type 2 (HCC)   Hypertension   CKD (chronic kidney disease), stage V (HCC)   Acute respiratory failure with hypoxia (HCC)   Non-STEMI (non-ST elevated myocardial infarction) (HCC)   Hyperkalemia  NSTEMI - patient presented with worsening shortness of breath dyspnea on exertion chest x-ray with pulmonary edema elevated BNP and increased elevated troponins. - LHC 03/26/2020: Coronary calcification with a 90% calcified proximal LAD stenosis, 30% first diagonal 30% LAD stenosis after the diagonal takeoff; dominant left circumflex coronary artery with calcification in 20 and 80% circumflex marginal stenoses, 30 and  80% PDA stenosis; small nondominant RCA. - s/pcoronary atherectomyto LAD - cards has s/o'd: continue ASA/plavix/statin  AKI on CKD stage V Metabolic acidosis Hyperphosphatemia     - 7/26: will get tunneled cath and start HD; appreciate nephro assistance  Hx of type 2 diabetes - A1c: 7.2 - SSI , DM diet - Glucose is acceptable, monitor  Hx of stroke  - on aspirin and statin.  acute heart failure with pulmonary edema - Patient received high dose Lasix now on hold for 24 hours prior to cath. - Patient had a cath 03/26/2020.  - Echocardiogram 03/24/2020 ejection fraction 45% anteroseptal wall is hypokinetic entire apex is hypokinetic left ventricle demonstrates regional wall motion abnormalities mild LVH. Right ventricular systolic function normal. - Cardiology onboard -Will defer to nephrology when to restart Lasix. - toprol, imdur  anemia of chronic renal disease - Hgb down to 7.5 today.  essential hypertension - continue toprol, imdur, hydralazine; titrate as needed  Hx of hyperlipidemia - on Zocor  Hx of tobacco abuse  - continue nicotine patch - continues to chew dip in room; advised against continuing this behavior  DVT prophylaxis: heparin Code Status: FULL Family Communication: None at beside.   Status is: Inpatient  Remains inpatient appropriate because:Inpatient level of care appropriate due to severity of illness   Dispo: The patient is from: Home              Anticipated d/c is to: Home              Anticipated d/c date is: 3 days              Patient currently is not medically stable  to d/c.  Consultants:   Cardiology  Nephrology  Procedures:   LHC, atherectomy   Subjective: Nausea controlled with phenergan  Objective: Vitals:   04/01/20 1118 04/01/20 1320 04/01/20 2029 04/02/20 0351  BP: (!) 168/72 (!) 174/85 (!) 152/69 (!) 131/60  Pulse: 75 71 77 (P) 70    Resp: 16 17 15  (P) 14  Temp: 98 F (36.7 C)  98.5 F (36.9 C) (P) 98.1 F (36.7 C)  TempSrc: Oral  Oral (P) Oral  SpO2: 96% 96% 96%   Weight:    (!) (P) 106.6 kg  Height:       No intake or output data in the 24 hours ending 04/02/20 0729 Filed Weights   03/31/20 0425 04/01/20 0538 04/02/20 0351  Weight: (!) 103.1 kg (!) 102.2 kg (!) (P) 106.6 kg    Examination:  General: 59 y.o. male resting in bed in NAD Cardiovascular: RRR, +S1, S2, no m/g/r, equal pulses throughout Respiratory: CTABL, no w/r/r, normal WOB GI: BS+, NDNT, no masses noted, no organomegaly noted MSK: No e/c/c Neuro: Alert to name, follows commands Psyc: calm/cooperative  Data Reviewed: I have personally reviewed following labs and imaging studies.  CBC: Recent Labs  Lab 03/29/20 0405 03/30/20 0513 03/31/20 0541 04/01/20 0815 04/02/20 0323  WBC 7.0 6.5 6.2 6.6 6.5  NEUTROABS  --  3.3 3.3  --  3.5  HGB 7.7* 7.7* 7.0* 8.3* 7.5*  HCT 24.2* 23.9* 22.0* 25.6* 23.5*  MCV 92.7 93.7 95.2 93.4 93.6  PLT 191 196 201 222 983   Basic Metabolic Panel: Recent Labs  Lab 03/29/20 0405 03/30/20 0513 03/31/20 0541 04/01/20 0815 04/02/20 0323  NA 136 137 137 140 139  K 4.8 4.4 4.0 3.9 4.7  CL 108 108 106 107 109  CO2 16* 19* 19* 21* 16*  GLUCOSE 142* 142* 138* 158* 168*  BUN 50* 54* 56* 55* 57*  CREATININE 6.00* 6.44* 7.52* 8.04* 8.00*  CALCIUM 8.6* 8.9 8.8* 8.7* 8.4*  MG 2.1 2.4 2.4 2.4 2.4  PHOS  --  6.0* 6.7* 6.8* 6.4*   GFR: Estimated Creatinine Clearance: 11.6 mL/min (A) (by C-G formula based on SCr of 8 mg/dL (H)). Liver Function Tests: Recent Labs  Lab 03/30/20 0513 03/31/20 0541 04/01/20 0815 04/02/20 0323  ALBUMIN 2.4* 2.4* 2.5* 2.4*   No results for input(s): LIPASE, AMYLASE in the last 168 hours. No results for input(s): AMMONIA in the last 168 hours. Coagulation Profile: No results for input(s): INR, PROTIME in the last 168 hours. Cardiac Enzymes: No results for input(s):  CKTOTAL, CKMB, CKMBINDEX, TROPONINI in the last 168 hours. BNP (last 3 results) No results for input(s): PROBNP in the last 8760 hours. HbA1C: No results for input(s): HGBA1C in the last 72 hours. CBG: Recent Labs  Lab 03/31/20 2107 04/01/20 0759 04/01/20 1116 04/01/20 1627 04/01/20 2152  GLUCAP 140* 129* 200* 201* 139*   Lipid Profile: No results for input(s): CHOL, HDL, LDLCALC, TRIG, CHOLHDL, LDLDIRECT in the last 72 hours. Thyroid Function Tests: No results for input(s): TSH, T4TOTAL, FREET4, T3FREE, THYROIDAB in the last 72 hours. Anemia Panel: No results for input(s): VITAMINB12, FOLATE, FERRITIN, TIBC, IRON, RETICCTPCT in the last 72 hours. Sepsis Labs: No results for input(s): PROCALCITON, LATICACIDVEN in the last 168 hours.  No results found for this or any previous visit (from the past 240 hour(s)).    Radiology Studies: No results found.   Scheduled Meds: . aspirin  81 mg Oral Daily  . atorvastatin  80 mg  Oral Daily  . Chlorhexidine Gluconate Cloth  6 each Topical Q0600  . clopidogrel  75 mg Oral Q breakfast  . heparin  5,000 Units Subcutaneous Q8H  . hydrALAZINE  100 mg Oral TID  . insulin aspart  0-6 Units Subcutaneous TID WC  . isosorbide mononitrate  30 mg Oral Daily  . loratadine  10 mg Oral Daily  . metoprolol succinate  50 mg Oral Daily  . nicotine  14 mg Transdermal Daily  . sodium bicarbonate  1,300 mg Oral TID  . sodium chloride flush  3 mL Intravenous Q12H   Continuous Infusions: . sodium chloride    . sodium chloride    .  ceFAZolin (ANCEF) IV    . ferumoxytol 510 mg (03/29/20 1010)     LOS: 10 days    Time spent: 25 minutes spent in the coordination of care today.    Jonnie Finner, DO [facility]  If 7PM-7AM, please contact night-coverage [email or web address] 04/02/2020, 7:29 AM

## 2020-04-02 NOTE — Progress Notes (Signed)
PT Cancellation Note  Patient Details Name: GAYLORD SEYDEL MRN: [id number] DOB: May 22, 1961   Cancelled Treatment:    Reason Eval/Treat Not Completed: Patient at procedure or test/unavailable Pt at IR this am for tunneled catheter placement and then at HD when checked in pm.  Will f/u as able. Abran Richard, PT Acute Rehab Services Pager [phone] [facility] Secretary 04/02/2020, 5:19 PM

## 2020-04-02 NOTE — Progress Notes (Signed)
I responded to a page from the nurse to provide Advance Directive information for the patient and family. I visited the patient's room and found the patient's niece, Claiborne Billings, present. I shared an overview of the AD and answered her questions. I left a copy of the AD for her and the patient to complete at a later time.    04/02/20 1845  Clinical Encounter Type  Visited With Family;Patient not available  Visit Type Spiritual support  Referral From Nurse  Consult/Referral To Chaplain  Spiritual Encounters  Spiritual Needs Brochure    Chaplain Dr Redgie Grayer

## 2020-04-02 NOTE — Procedures (Signed)
Interventional Radiology Procedure Note  Procedure: Placement of a right IJ approach tunneled HD catheter.  23cm tip to cuff Tip is positioned at the superior cavoatrial junction and catheter is ready for immediate use.   Stitch at the skin tunnel site, for venous oozing on plavix  Complications: None  Recommendations:  - Ok to use - Do not submerge - Routine line care  - stitch at the tunnel entry site may be safely removed in 5-7 days at dialysis  Signed,  Dulcy Fanny. Earleen Newport, DO

## 2020-04-02 NOTE — Progress Notes (Signed)
Admit: 03/23/2020 LOS: 58  51M progresive CKD5 with NSTEMI  Subjective:   No c/o, still with some nausea  For [facility] and HD#1 today, he is in agreement  No intake/output data recorded.  Filed Weights   03/31/20 0425 04/01/20 0538 04/02/20 0351  Weight: (!) 103.1 kg (!) 102.2 kg (!) (P) 106.6 kg    Scheduled Meds: . aspirin  81 mg Oral Daily  . atorvastatin  80 mg Oral Daily  . Chlorhexidine Gluconate Cloth  6 each Topical Q0600  . clopidogrel  75 mg Oral Q breakfast  . heparin  5,000 Units Subcutaneous Q8H  . hydrALAZINE  100 mg Oral TID  . insulin aspart  0-6 Units Subcutaneous TID WC  . isosorbide mononitrate  30 mg Oral Daily  . loratadine  10 mg Oral Daily  . metoprolol succinate  50 mg Oral Daily  . nicotine  14 mg Transdermal Daily  . sodium bicarbonate  1,300 mg Oral TID  . sodium chloride flush  3 mL Intravenous Q12H   Continuous Infusions: . sodium chloride    . sodium chloride    .  ceFAZolin (ANCEF) IV    . ferumoxytol 510 mg (03/29/20 1010)   PRN Meds:.sodium chloride, sodium chloride, acetaminophen, diazepam, loperamide, ondansetron (ZOFRAN) IV, promethazine, sodium chloride flush  Current Labs: reviewed  Results for DONEVIN, SAINSBURY (MRN [id number]) as of 03/25/2020 10:20  Ref. Range 03/24/2020 06:15  Saturation Ratios Latest Ref Range: 17.9 - 39.5 % 36  Ferritin Latest Ref Range: 24 - 336 ng/mL 26    Physical Exam:  Blood pressure (!) 130/59, pulse 64, temperature (P) 98.1 F (36.7 C), temperature source (P) Oral, resp. rate 19, height 5\' 10"  (1.778 m), weight (!) (P) 106.6 kg, SpO2 97 %. GEN: NAD. Sitting up in chair ENT: NCAT EYES: EOMI CV: RRR no rub PULM: normal wob ABD: s/nt/nd SKIN: no rashes/lesions; LUE AVF incision well healed EXT:no sig LEE Neuro: speech clear and coherent, no asterixis LUE AVF +B/T   Recent Labs  Lab 03/31/20 0541 04/01/20 0815 04/02/20 0323  NA 137 140 139  K 4.0 3.9 4.7  CL 106 107 109  CO2 19* 21* 16*   GLUCOSE 138* 158* 168*  BUN 56* 55* 57*  CREATININE 7.52* 8.04* 8.00*  CALCIUM 8.8* 8.7* 8.4*  PHOS 6.7* 6.8* 6.4*   Recent Labs  Lab 03/30/20 0513 03/30/20 0513 03/31/20 0541 04/01/20 0815 04/02/20 0323  WBC 6.5   < > 6.2 6.6 6.5  NEUTROABS 3.3  --  3.3  --  3.5  HGB 7.7*   < > 7.0* 8.3* 7.5*  HCT 23.9*   < > 22.0* 25.6* 23.5*  MCV 93.7   < > 95.2 93.4 93.6  PLT 196   < > 201 222 208   < > = values in this interval not displayed.      A 1. 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  Starting HD 2. SOB / Hypoxia / Pulm Edema, improved 3. NSTEMI s/p LAD DES 03/28/20 (127mL contrast utilized) 4. Recent but not mature LUE AVF +B/T, healing well 5. DM2 6. HTN 7. Anemia of chronic kidney disease. S/p feraheme, s/p 1u prbc 7/24, 7s-8s 8. CKDBMD: P 6s, CTM for now while starting HD 9. Metabolic acidosis secondary to #1, improved  P . TDC today . HD#1 3K, abbreviated treatment . HD#2 tomorrow: 3K 2.5h, 1L UF, No heparin, 300/500 . CLIP, likley to [employer] . Start ESA  today with HD . Stop NaHCo3 with HD . Renal diet . Daily weights, Daily Renal Panel, Strict I/Os, Avoid nephrotoxins (NSAIDs, judicious IV Contrast)    Rex[employer]

## 2020-04-03 ENCOUNTER — Inpatient Hospital Stay (HOSPITAL_COMMUNITY): Payer: Medicare Other

## 2020-04-03 DIAGNOSIS — I214 Non-ST elevation (NSTEMI) myocardial infarction: Secondary | ICD-10-CM | POA: Diagnosis not present

## 2020-04-03 DIAGNOSIS — E8770 Fluid overload, unspecified: Secondary | ICD-10-CM | POA: Diagnosis not present

## 2020-04-03 DIAGNOSIS — E875 Hyperkalemia: Secondary | ICD-10-CM | POA: Diagnosis not present

## 2020-04-03 DIAGNOSIS — N185 Chronic kidney disease, stage 5: Secondary | ICD-10-CM | POA: Diagnosis not present

## 2020-04-03 LAB — CBC WITH DIFFERENTIAL/PLATELET
Abs Immature Granulocytes: 0.07 10*3/uL (ref 0.00–0.07)
Basophils Absolute: 0 10*3/uL (ref 0.0–0.1)
Basophils Relative: 1 %
Eosinophils Absolute: 0.2 10*3/uL (ref 0.0–0.5)
Eosinophils Relative: 3 %
HCT: 24.5 % — ABNORMAL LOW (ref 39.0–52.0)
Hemoglobin: 7.8 g/dL — ABNORMAL LOW (ref 13.0–17.0)
Immature Granulocytes: 1 %
Lymphocytes Relative: 24 %
Lymphs Abs: 1.9 10*3/uL (ref 0.7–4.0)
MCH: 29.7 pg (ref 26.0–34.0)
MCHC: 31.8 g/dL (ref 30.0–36.0)
MCV: 93.2 fL (ref 80.0–100.0)
Monocytes Absolute: 1 10*3/uL (ref 0.1–1.0)
Monocytes Relative: 13 %
Neutro Abs: 4.6 10*3/uL (ref 1.7–7.7)
Neutrophils Relative %: 58 %
Platelets: 223 10*3/uL (ref 150–400)
RBC: 2.63 MIL/uL — ABNORMAL LOW (ref 4.22–5.81)
RDW: 13.5 % (ref 11.5–15.5)
WBC: 7.8 10*3/uL (ref 4.0–10.5)
nRBC: 0 % (ref 0.0–0.2)

## 2020-04-03 LAB — GLUCOSE, CAPILLARY
Glucose-Capillary: 162 mg/dL — ABNORMAL HIGH (ref 70–99)
Glucose-Capillary: 177 mg/dL — ABNORMAL HIGH (ref 70–99)
Glucose-Capillary: 218 mg/dL — ABNORMAL HIGH (ref 70–99)

## 2020-04-03 LAB — RENAL FUNCTION PANEL
Albumin: 2.4 g/dL — ABNORMAL LOW (ref 3.5–5.0)
Anion gap: 11 (ref 5–15)
BUN: 40 mg/dL — ABNORMAL HIGH (ref 8–23)
CO2: 21 mmol/L — ABNORMAL LOW (ref 22–32)
Calcium: 8.5 mg/dL — ABNORMAL LOW (ref 8.9–10.3)
Chloride: 107 mmol/L (ref 98–111)
Creatinine, Ser: 6.51 mg/dL — ABNORMAL HIGH (ref 0.61–1.24)
GFR calc Af Amer: 10 mL/min — ABNORMAL LOW (ref >60)
GFR calc non Af Amer: 8 mL/min — ABNORMAL LOW (ref >60)
Glucose, Bld: 148 mg/dL — ABNORMAL HIGH (ref 70–99)
Phosphorus: 4.9 mg/dL — ABNORMAL HIGH (ref 2.5–4.6)
Potassium: 3.5 mmol/L (ref 3.5–5.1)
Sodium: 139 mmol/L (ref 135–145)

## 2020-04-03 LAB — MAGNESIUM: Magnesium: 2.1 mg/dL (ref 1.7–2.4)

## 2020-04-03 MED ORDER — SODIUM CHLORIDE 0.9 % IV SOLN
125.0000 mg | INTRAVENOUS | Status: DC
  Administered 2020-04-03 – 2020-04-06 (×2): 125 mg via INTRAVENOUS
  Filled 2020-04-03 (×4): qty 10

## 2020-04-03 MED ORDER — TRAZODONE HCL 50 MG PO TABS
50.0000 mg | ORAL_TABLET | Freq: Every evening | ORAL | Status: AC | PRN
  Administered 2020-04-03 – 2020-04-04 (×2): 50 mg via ORAL
  Filled 2020-04-03 (×2): qty 1

## 2020-04-03 MED ORDER — HEPARIN SODIUM (PORCINE) 1000 UNIT/ML IJ SOLN
INTRAMUSCULAR | Status: AC
  Filled 2020-04-03: qty 4

## 2020-04-03 NOTE — Progress Notes (Signed)
Physical Therapy Treatment Patient Details Name: William Schroeder MRN: [id number] DOB: [birth year] Today's Date: 04/03/2020    History of Present Illness Pt is a 59 y/o male admitted secondary to SOB and found to have an NSTEMI. Pt now s/p coronary atherectomy of LAD lesion on 7/21. PMH including but not limited to DM, HTN and CVA.    PT Comments    Pt supine in bed with bed linens soaked in contents of pt's snuff spit cup. Pt agreeable to get to EoB to change gown before ambulating in hallway. Pt is limited in safe mobility by decreased L LE clearance in gait due to long standing injury in the presence of generalized weakness and decreased cognition, increasing his risk for fall. Pt is currently min A for bed mobility and transfers. Pt requires min physical assist and maximal multimodal cuing during gait. Including insistence on seated rest break in recliner follow when pt begins to trip over his L foot. PT recommending SNF level rehab at discharge and pt is hopeful to go tomorrow. PT will continue to follow acutely if d/c is not imminent.     Follow Up Recommendations  SNF     Equipment Recommendations  None recommended by PT       Precautions / Restrictions Precautions Precautions: Fall Restrictions Weight Bearing Restrictions: No    Mobility  Bed Mobility Overal bed mobility: Needs Assistance Bed Mobility: Supine to Sit     Supine to sit: Min assist     General bed mobility comments: with increased time and effort pt able to get LE off bed, requires minA for bringing trunk to upright   Transfers Overall transfer level: Needs assistance Equipment used: Rolling walker (2 wheeled) Transfers: Sit to/from Stand Sit to Stand: From elevated surface;Min assist;+2 physical assistance         General transfer comment: minAx2 for power up into standing, vc for finding balance before attempting to ambulation   Ambulation/Gait Ambulation/Gait assistance: Min assist Gait Distance  (Feet): 75 Feet (1x75, 1x25) Assistive device: Rolling walker (2 wheeled) Gait Pattern/deviations: Step-through pattern;Decreased dorsiflexion - left;Decreased weight shift to left;Steppage;Trunk flexed Gait velocity: varying, too fast for instability    General Gait Details: min A for steadying with RW, decreased L LE clearance with swing through due to decreased L foot dorsiflexion, pt needs cuing to rest when he begins to trip over foot with advancement       Balance Overall balance assessment: Needs assistance Sitting-balance support: Feet supported Sitting balance-Leahy Scale: Fair     Standing balance support: Bilateral upper extremity supported;During functional activity Standing balance-Leahy Scale: Poor                              Cognition Arousal/Alertness: Awake/alert Behavior During Therapy: WFL for tasks assessed/performed Overall Cognitive Status: Impaired/Different from baseline Area of Impairment: Safety/judgement                         Safety/Judgement: Decreased awareness of safety;Decreased awareness of deficits     General Comments: continues to show poor awareness, pt found in bed with contents of his snuf spit cup emptied all over the bed and himself, pt also very slow in processing today          General Comments General comments (skin integrity, edema, etc.): VSS on RA      Pertinent Vitals/Pain Pain Assessment: No/denies pain  Home Living Family/patient expects to be discharged to:: Private residence Living Arrangements: Children Available Help at Discharge: Family;Available 24 hours/day Type of Home: House Home Access: Ramped entrance   Home Layout: One level Home Equipment: Walker - 4 wheels;Shower seat;Wheelchair - Banker      Prior Function Level of Independence: Independent with assistive device(s)      Comments: ambulates with a rollator   PT Goals (current goals can now be found in the  care plan section) Acute Rehab PT Goals Patient Stated Goal: to get stronger PT Goal Formulation: With patient Time For Goal Achievement: 04/12/20 Potential to Achieve Goals: Good Progress towards PT goals: Progressing toward goals    Frequency    Min 2X/week      PT Plan Discharge plan needs to be updated       AM-PAC PT "6 Clicks" Mobility   Outcome Measure  Help needed turning from your back to your side while in a flat bed without using bedrails?: None Help needed moving from lying on your back to sitting on the side of a flat bed without using bedrails?: A Little Help needed moving to and from a bed to a chair (including a wheelchair)?: A Little Help needed standing up from a chair using your arms (e.g., wheelchair or bedside chair)?: A Little Help needed to walk in hospital room?: A Little Help needed climbing 3-5 steps with a railing? : Total 6 Click Score: 17    End of Session Equipment Utilized During Treatment: Gait belt Activity Tolerance: Patient limited by fatigue Patient left: in bed;with call bell/phone within reach;with bed alarm set Nurse Communication: Mobility status PT Visit Diagnosis: Other abnormalities of gait and mobility (R26.89);Muscle weakness (generalized) (M62.81)     Time: 4665-9935 PT Time Calculation (min) (ACUTE ONLY): 15 min  Charges:  $Gait Training: 8-22 mins                     Lorice Lafave B. Migdalia Dk PT, DPT Acute Rehabilitation Services Pager [phone] Office [phone]    [place] 04/03/2020, 5:15 PM

## 2020-04-03 NOTE — Progress Notes (Signed)
RN-Laura phoned spiritual care office with Pt. request to change the Pt. HCPOA.  Mickel Baas shared the Pt. current HCPOA is unable to hold the responsibility at this time.  The Pt desires HCPOA to be given to the Pt. Niece.  Chaplain will F/U on Wednesday morning.

## 2020-04-03 NOTE — Procedures (Signed)
I was present at this dialysis session. I have reviewed the session itself and made appropriate changes.   TDC yesterday, HD#1 today.  4K, 300/500, UF 1L.  No issues.  CLIP in process.  Cont [facility] schedule anticipate will DC to [facility] as outpt.     Filed Weights   04/02/20 0351 04/02/20 1625 04/02/20 1800  Weight: (!) 106.6 kg (!) 102.7 kg (!) 102.1 kg    Recent Labs  Lab 04/03/20 0332  NA 139  K 3.5  CL 107  CO2 21*  GLUCOSE 148*  BUN 40*  CREATININE 6.51*  CALCIUM 8.5*  PHOS 4.9*    Recent Labs  Lab 03/31/20 0541 03/31/20 0541 04/01/20 0815 04/02/20 0323 04/03/20 0332  WBC 6.2   < > 6.6 6.5 7.8  NEUTROABS 3.3  --   --  3.5 4.6  HGB 7.0*   < > 8.3* 7.5* 7.8*  HCT 22.0*   < > 25.6* 23.5* 24.5*  MCV 95.2   < > 93.4 93.6 93.2  PLT 201   < > 222 208 223   < > = values in this interval not displayed.    Scheduled Meds: . aspirin  81 mg Oral Daily  . atorvastatin  80 mg Oral Daily  . Chlorhexidine Gluconate Cloth  6 each Topical Q0600  . clopidogrel  75 mg Oral Q breakfast  . darbepoetin (ARANESP) injection - DIALYSIS  60 mcg Intravenous Q Mon-HD  . heparin  5,000 Units Subcutaneous Q8H  . hydrALAZINE  100 mg Oral TID  . insulin aspart  0-6 Units Subcutaneous TID WC  . isosorbide mononitrate  30 mg Oral Daily  . loratadine  10 mg Oral Daily  . metoprolol succinate  50 mg Oral Daily  . nicotine  14 mg Transdermal Daily  . sodium chloride flush  3 mL Intravenous Q12H   Continuous Infusions: . sodium chloride    . sodium chloride    .  ceFAZolin (ANCEF) IV    . ferumoxytol 510 mg (03/29/20 1010)   PRN Meds:.sodium chloride, sodium chloride, acetaminophen, diazepam, loperamide, ondansetron (ZOFRAN) IV, promethazine, sodium chloride flush   Pearson Grippe  MD 04/03/2020, 8:28 AM

## 2020-04-03 NOTE — Progress Notes (Signed)
PROGRESS NOTE    ODA LANSDOWNE  QZE:[id number] DOB: Jan 24, 1961 DOA: 03/23/2020 PCP: [facility]   Brief Narrative:   William Cornelio Danielsis a 59 y.o.malewith medical history significant forinsulin-dependent diabetes mellitus, history of CVA, hypertension, and chronic kidney disease stage V, now presenting to the emergency department for evaluation of shortness of breath. Patient reports some chronic dyspnea and notes that he typically sleeps in a recliner because of shortness of breath and back pain when he tries to lay flat.His shortness of breath was worse than usual on 03/22/2020, he tried to sleep been a bed that night, but developed severe worsening in his dyspnea and felt as though he cannot catch his breath even when he sat back up. His ex-wife was with him, noted that he appeared pale and struggling to breathe, and EMS was called. Patient denies any cough, denies fevers or chills, and has not experienced any chest discomfort or palpitations associated with this. He denies any leg swelling but reports that his abdomen was swollen recently.  7/27: Denies complaints this AM. Seen on HD. BP up a little, but didn't get all his meds this morning. Continue to follow.   Assessment & Plan: NSTEMI - patient presented with worsening shortness of breath dyspnea on exertion chest x-ray with pulmonary edema elevated BNP and increased elevated troponins. - LHC 03/26/2020: Coronary calcification with a 90% calcified proximal LAD stenosis, 30% first diagonal 30% LAD stenosis after the diagonal takeoff; dominant left circumflex coronary artery with calcification in 20 and 80% circumflex marginal stenoses, 30 and 80% PDA stenosis; small nondominant RCA. - s/pcoronary atherectomyto LAD -cards has s/o'd: continue ASA/plavix/statin  AKI on CKD stage V Metabolic acidosis Hyperphosphatemia - 7/27: tunneled cath placed; HD today, appreciate nephrology assistance. CLIP in  process.  Hx of type 2 diabetes - A1c: 7.2 - SSI , DM diet -Glucose is acceptable, monitor  Hx of stroke  - on aspirin and statin.  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 - Patient had a cath 03/26/2020.  - Echocardiogram 03/24/2020 ejection fraction 45% anteroseptal wall is hypokinetic entire apex is hypokinetic left ventricle demonstrates regional wall motion abnormalities mild LVH. Right ventricular systolic function normal. - Cardiology onboard -Will defer to nephrology when to restart Lasix. - toprol, imdur  anemia of chronic renal disease - Hgb stable at 7.8 today, no evidence of bleed, follow  essential hypertension -continue toprol, imdur, hydralazine; titrate as needed  Hx of hyperlipidemia - on Zocor  Hx of tobacco abuse  - continue nicotine patch - has dip in room; advised against continuing this behavior  DVT prophylaxis: heparin Code Status: FULL Family Communication: None at bedside.    Status is: Inpatient  Remains inpatient appropriate because:Inpatient level of care appropriate due to severity of illness   Dispo: The patient is from: Home              Anticipated d/c is to: Home              Anticipated d/c date is: 3 days              Patient currently is not medically stable to d/c.  Consultants:   Cardiology  Nephrology  Procedures:   LHC, Athrectomy   ROS:  Denies CP, N, V, ab pain, dyspnea . Remainder 10-pt ROS is negative for all not previously mentioned.  Subjective: "I wish I could be on  my porch right now."  Objective: Vitals:   04/02/20 1800 04/02/20 2030 04/03/20 0523 04/03/20 0714  BP: (!) 143/63 (!) 183/77  (!) 148/62  Pulse: 79 85  72  Resp: 16 18 15 16   Temp: 98.3 F (36.8 C) 98.1 F (36.7 C) 98 F (36.7 C)   TempSrc: Oral Oral Oral   SpO2: 97% 97%  98%    Weight: (!) 102.1 kg     Height:        Intake/Output Summary (Last 24 hours) at 04/03/2020 0725 Last data filed at 04/03/2020 0700 Gross per 24 hour  Intake 1070 ml  Output 500 ml  Net 570 ml   Filed Weights   04/02/20 0351 04/02/20 1625 04/02/20 1800  Weight: (!) 106.6 kg (!) 102.7 kg (!) 102.1 kg    Examination:  General: 59 y.o. male resting in bed in NAD seen on HD Cardiovascular: RRR, +S1, S2, no m/g/r, Respiratory: CTABL, no w/r/r, normal WOB GI: BS+, NDNT, no masses noted, no organomegaly noted MSK: No e/c/c Neuro: A&O x 3, no focal deficits Psyc: Appropriate interaction and affect, calm/cooperative   Data Reviewed: I have personally reviewed following labs and imaging studies.  CBC: Recent Labs  Lab 03/30/20 0513 03/31/20 0541 04/01/20 0815 04/02/20 0323 04/03/20 0332  WBC 6.5 6.2 6.6 6.5 7.8  NEUTROABS 3.3 3.3  --  3.5 4.6  HGB 7.7* 7.0* 8.3* 7.5* 7.8*  HCT 23.9* 22.0* 25.6* 23.5* 24.5*  MCV 93.7 95.2 93.4 93.6 93.2  PLT 196 201 222 208 111   Basic Metabolic Panel: Recent Labs  Lab 03/30/20 0513 03/31/20 0541 04/01/20 0815 04/02/20 0323 04/03/20 0332  NA 137 137 140 139 139  K 4.4 4.0 3.9 4.7 3.5  CL 108 106 107 109 107  CO2 19* 19* 21* 16* 21*  GLUCOSE 142* 138* 158* 168* 148*  BUN 54* 56* 55* 57* 40*  CREATININE 6.44* 7.52* 8.04* 8.00* 6.51*  CALCIUM 8.9 8.8* 8.7* 8.4* 8.5*  MG 2.4 2.4 2.4 2.4 2.1  PHOS 6.0* 6.7* 6.8* 6.4* 4.9*   GFR: Estimated Creatinine Clearance: 14.3 mL/min (A) (by C-G formula based on SCr of 6.51 mg/dL (H)). Liver Function Tests: Recent Labs  Lab 03/30/20 0513 03/31/20 0541 04/01/20 0815 04/02/20 0323 04/03/20 0332  ALBUMIN 2.4* 2.4* 2.5* 2.4* 2.4*   No results for input(s): LIPASE, AMYLASE in the last 168 hours. No results for input(s): AMMONIA in the last 168 hours. Coagulation Profile: No results for input(s): INR, PROTIME in the last 168 hours. Cardiac Enzymes: No results for input(s): CKTOTAL, CKMB,  CKMBINDEX, TROPONINI in the last 168 hours. BNP (last 3 results) No results for input(s): PROBNP in the last 8760 hours. HbA1C: No results for input(s): HGBA1C in the last 72 hours. CBG: Recent Labs  Lab 04/01/20 1627 04/01/20 2152 04/02/20 0752 04/02/20 1240 04/02/20 2225  GLUCAP 201* 139* 189* 130* 234*   Lipid Profile: No results for input(s): CHOL, HDL, LDLCALC, TRIG, CHOLHDL, LDLDIRECT in the last 72 hours. Thyroid Function Tests: No results for input(s): TSH, T4TOTAL, FREET4, T3FREE, THYROIDAB in the last 72 hours. Anemia Panel: No results for input(s): VITAMINB12, FOLATE, FERRITIN, TIBC, IRON, RETICCTPCT in the last 72 hours. Sepsis Labs: No results for input(s): PROCALCITON, LATICACIDVEN in the last 168 hours.  No results found for this or any previous visit (from the past 240 hour(s)).    Radiology Studies: IR Fluoro Guide CV Line Right  Result Date: 04/02/2020 INDICATION: 59 year old male with a history of renal dysfunction  referred tunneled hemodialysis catheter EXAM: TUNNELED CENTRAL VENOUS HEMODIALYSIS CATHETER PLACEMENT WITH ULTRASOUND AND FLUOROSCOPIC GUIDANCE MEDICATIONS: 2 g Ancef. The antibiotic was given in an appropriate time interval prior to skin puncture. ANESTHESIA/SEDATION: Moderate (conscious) sedation was employed during this procedure. A total of Versed 1.0 mg and Fentanyl 50 mcg was administered intravenously. Moderate Sedation Time: 19 minutes. The patient's level of consciousness and vital signs were monitored continuously by radiology nursing throughout the procedure under my direct supervision. FLUOROSCOPY TIME:  Fluoroscopy Time: 0 minutes 30 seconds (7 mGy). COMPLICATIONS: None PROCEDURE: Informed written consent was obtained from the patient after a discussion of the risks, benefits, and alternatives to treatment. Questions regarding the procedure were encouraged and answered. The right neck and chest were prepped with chlorhexidine in a sterile  fashion, and a sterile drape was applied covering the operative field. Maximum barrier sterile technique with sterile gowns and gloves were used for the procedure. A timeout was performed prior to the initiation of the procedure. Ultrasound survey was performed. Micropuncture kit was utilized to access the right internal jugular vein under direct, real-time ultrasound guidance after the overlying soft tissues were anesthetized with 1% lidocaine with epinephrine. Stab incision was made with 11 blade scalpel. Microwire was passed centrally. The microwire was then marked to measure appropriate internal catheter length. External tunneled length was estimated. A total tip to cuff length of 23 cm was selected. 035 guidewire was advanced to the level of the IVC. Skin and subcutaneous tissues of chest wall below the clavicle were generously infiltrated with 1% lidocaine for local anesthesia. A small stab incision was made with 11 blade scalpel. The selected hemodialysis catheter was tunneled in a retrograde fashion from the anterior chest wall to the venotomy incision. Serial dilation was performed and then a peel-away sheath was placed. The catheter was then placed through the peel-away sheath with tips ultimately positioned within the superior aspect of the right atrium. Final catheter positioning was confirmed and documented with a spot radiographic image. The catheter aspirates and flushes normally. The catheter was flushed with appropriate volume heparin dwells. The catheter exit site was secured with a 0-Prolene retention suture. Gel-Foam slurry was infused into the soft tissue tract. There was some oozing at the skin entry site at the subcutaneous tract. Additional manual pressure was used, as well as an additional 0 Prolene suture, which can be removed in 5-7 days at dialysis. The venotomy incision was closed Derma bond and sterile dressing. Dressings were applied at the chest wall. Patient tolerated the procedure  well and remained hemodynamically stable throughout. No complications were encountered and no significant blood loss encountered. IMPRESSION: Status post right IJ tunneled hemodialysis catheter placement. Catheter ready for use. Signed, Dulcy Fanny. Dellia Nims, RPVI [facility] Radiology Electronically Signed   By: Corrie Mckusick D.O.   On: 04/02/2020 12:42   IR US Guide Vasc Access Right  Result Date: 04/02/2020 INDICATION: 59 year old male with a history of renal dysfunction referred tunneled hemodialysis catheter EXAM: TUNNELED CENTRAL VENOUS HEMODIALYSIS CATHETER PLACEMENT WITH ULTRASOUND AND FLUOROSCOPIC GUIDANCE MEDICATIONS: 2 g Ancef. The antibiotic was given in an appropriate time interval prior to skin puncture. ANESTHESIA/SEDATION: Moderate (conscious) sedation was employed during this procedure. A total of Versed 1.0 mg and Fentanyl 50 mcg was administered intravenously. Moderate Sedation Time: 19 minutes. The patient's level of consciousness and vital signs were monitored continuously by radiology nursing throughout the procedure under my direct supervision. FLUOROSCOPY TIME:  Fluoroscopy Time: 0  minutes 30 seconds (7 mGy). COMPLICATIONS: None PROCEDURE: Informed written consent was obtained from the patient after a discussion of the risks, benefits, and alternatives to treatment. Questions regarding the procedure were encouraged and answered. The right neck and chest were prepped with chlorhexidine in a sterile fashion, and a sterile drape was applied covering the operative field. Maximum barrier sterile technique with sterile gowns and gloves were used for the procedure. A timeout was performed prior to the initiation of the procedure. Ultrasound survey was performed. Micropuncture kit was utilized to access the right internal jugular vein under direct, real-time ultrasound guidance after the overlying soft tissues were anesthetized with 1% lidocaine with  epinephrine. Stab incision was made with 11 blade scalpel. Microwire was passed centrally. The microwire was then marked to measure appropriate internal catheter length. External tunneled length was estimated. A total tip to cuff length of 23 cm was selected. 035 guidewire was advanced to the level of the IVC. Skin and subcutaneous tissues of chest wall below the clavicle were generously infiltrated with 1% lidocaine for local anesthesia. A small stab incision was made with 11 blade scalpel. The selected hemodialysis catheter was tunneled in a retrograde fashion from the anterior chest wall to the venotomy incision. Serial dilation was performed and then a peel-away sheath was placed. The catheter was then placed through the peel-away sheath with tips ultimately positioned within the superior aspect of the right atrium. Final catheter positioning was confirmed and documented with a spot radiographic image. The catheter aspirates and flushes normally. The catheter was flushed with appropriate volume heparin dwells. The catheter exit site was secured with a 0-Prolene retention suture. Gel-Foam slurry was infused into the soft tissue tract. There was some oozing at the skin entry site at the subcutaneous tract. Additional manual pressure was used, as well as an additional 0 Prolene suture, which can be removed in 5-7 days at dialysis. The venotomy incision was closed Derma bond and sterile dressing. Dressings were applied at the chest wall. Patient tolerated the procedure well and remained hemodynamically stable throughout. No complications were encountered and no significant blood loss encountered. IMPRESSION: Status post right IJ tunneled hemodialysis catheter placement. Catheter ready for use. Signed, Dulcy Fanny. Dellia Nims, RPVI [facility] Radiology Special[facility] Radiology Electronically Signed   By: Corrie Mckusick D.O.   On: 04/02/2020 12:42     Scheduled Meds: . aspirin  81 mg Oral  Daily  . atorvastatin  80 mg Oral Daily  . Chlorhexidine Gluconate Cloth  6 each Topical Q0600  . clopidogrel  75 mg Oral Q breakfast  . darbepoetin (ARANESP) injection - DIALYSIS  60 mcg Intravenous Q Mon-HD  . heparin  5,000 Units Subcutaneous Q8H  . hydrALAZINE  100 mg Oral TID  . insulin aspart  0-6 Units Subcutaneous TID WC  . isosorbide mononitrate  30 mg Oral Daily  . loratadine  10 mg Oral Daily  . metoprolol succinate  50 mg Oral Daily  . nicotine  14 mg Transdermal Daily  . sodium chloride flush  3 mL Intravenous Q12H   Continuous Infusions: . sodium chloride    . sodium chloride    .  ceFAZolin (ANCEF) IV    . ferumoxytol 510 mg (03/29/20 1010)     LOS: 11 days    Time spent: 25 minutes spent in the coordination of care today.    Jonnie Finner, DO [facility]  If 7PM-7AM, please contact night-coverage [email or web address] 04/03/2020, 7:25 AM

## 2020-04-03 NOTE — Progress Notes (Signed)
[facility] referrals require a chest x-ray or TB test to be included for OP HD treatment. Navigator has discussed with Nephrologist/Dr. Joelyn Oms to request one be ordered. OP HD referral in process to [facility].  Alphonzo Cruise, [place] [facility] Navigator [phone]

## 2020-04-04 DIAGNOSIS — N185 Chronic kidney disease, stage 5: Secondary | ICD-10-CM | POA: Diagnosis not present

## 2020-04-04 DIAGNOSIS — J9601 Acute respiratory failure with hypoxia: Secondary | ICD-10-CM | POA: Diagnosis not present

## 2020-04-04 DIAGNOSIS — I214 Non-ST elevation (NSTEMI) myocardial infarction: Secondary | ICD-10-CM | POA: Diagnosis not present

## 2020-04-04 DIAGNOSIS — E8770 Fluid overload, unspecified: Secondary | ICD-10-CM | POA: Diagnosis not present

## 2020-04-04 LAB — GLUCOSE, CAPILLARY
Glucose-Capillary: 169 mg/dL — ABNORMAL HIGH (ref 70–99)
Glucose-Capillary: 260 mg/dL — ABNORMAL HIGH (ref 70–99)
Glucose-Capillary: 175 mg/dL — ABNORMAL HIGH (ref 70–99)
Glucose-Capillary: 206 mg/dL — ABNORMAL HIGH (ref 70–99)

## 2020-04-04 NOTE — NC FL2 (Signed)
[place] LEVEL OF CARE SCREENING TOOL     IDENTIFICATION  Patient Name: William Schroeder Birthdate: 02/19/1961 Sex: male Admission Date (Current Location): 03/23/2020  [facility] and [place] Number:  Herbalist and Address:  [facility] [place], [place] [address], [place], [place] [address]      Provider Number: [id number]  Attending Physician Name and Address:  Flora Lipps, MD  Relative Name and Phone Number:  Marzetta Board [phone]    Current Level of Care: Hospital Recommended Level of Care: [place] Prior Approval Number:    Date Approved/Denied:   PASRR Number: [id number]  Discharge Plan: SNF    Current Diagnoses: Patient Active Problem List   Diagnosis Date Noted  . CKD (chronic kidney disease), stage V ([place]) 03/23/2020  . Acute respiratory failure with hypoxia ([place]) 03/23/2020  . Non-STEMI (non-ST elevated myocardial infarction) ([place]) 03/23/2020  . Hypervolemia associated with renal insufficiency 03/23/2020  . Hyperkalemia 03/23/2020  . Carpal tunnel syndrome 10/18/2014  . Major depressive disorder, recurrent, severe without psychotic features ([place])   . Tobacco use disorder, moderate, dependence   . Major depressive disorder, single episode, severe without psychotic features ([place])   . Depression 07/06/2014  . Diabetes mellitus, type 2 ([place]) 07/06/2014  . Hypertension 07/06/2014  . Dehydration 04/12/2013  . Gastroparesis 04/12/2013  . Syncope 04/11/2013  . Diabetes mellitus ([place]) 04/11/2013  . Abdominal pain, epigastric 10/27/2012  . Barrett's esophagus 10/27/2012  . Early satiety 08/02/2012  . Anorexia 08/02/2012  . Epigastric pain 08/02/2012  . Bowel habit changes 08/02/2012  . Constipation 08/02/2012  . FH: pancreatic cancer 08/02/2012  . Chronic, continuous use of opioids 08/02/2012  . Chest pain 02/23/2001    Orientation RESPIRATION BLADDER Height & Weight     Self, Time, Situation, Place   Normal Continent Weight: (!) 224 lb 12.5 oz (102 kg) Height:  5\' 10"  (177.8 cm)  BEHAVIORAL SYMPTOMS/MOOD NEUROLOGICAL BOWEL NUTRITION STATUS      Continent Diet (See Discharge Summary)  AMBULATORY STATUS COMMUNICATION OF NEEDS Skin   Limited Assist Verbally Surgical wounds, Other (Comment) (Approp for ethnicity,dry,intact,non-tenting, Incision closed neck right adhesive bandage dry clean intact dressing in place,Incision closed chest right gauze,dry,intact,clean)                       Personal Care Assistance Level of Assistance  Bathing, Feeding, Dressing Bathing Assistance: Limited assistance Feeding assistance: Independent (able to feed self;NPO) Dressing Assistance: Limited assistance     Functional Limitations Info  Sight, Hearing, Speech Sight Info: Adequate Hearing Info: Adequate Speech Info: Adequate    SPECIAL CARE FACTORS FREQUENCY  PT (By licensed PT), OT (By licensed OT)     PT Frequency: 5x min weekly OT Frequency: 5x min weekly            Contractures Contractures Info: Not present    Additional Factors Info  Insulin Sliding Scale, Code Status, Allergies Code Status Info: FULL code Allergies Info: No Known Allergies   Insulin Sliding Scale Info: insulin aspart (novoLOG) injection 0-6 Units 3 times daily with meals       Current Medications (04/04/2020):  This is the current hospital active medication list Current Facility-Administered Medications  Medication Dose Route Frequency Provider Last Rate Last Admin  . 0.9 %  sodium chloride infusion  250 mL Intravenous PRN Shelva Majestic A, MD      . 0.9 %  sodium chloride infusion  250 mL Intravenous PRN  Troy Sine, MD      . acetaminophen (TYLENOL) tablet 650 mg  650 mg Oral Q4H PRN Troy Sine, MD      . aspirin chewable tablet 81 mg  81 mg Oral Daily Troy Sine, MD   81 mg at 04/04/20 0936  . atorvastatin (LIPITOR) tablet 80 mg  80 mg Oral Daily Troy Sine, MD   80 mg at 04/04/20  0936  . Chlorhexidine Gluconate Cloth 2 % PADS 6 each  6 each Topical Q0600 Gean Quint, MD   6 each at 04/04/20 0612  . clopidogrel (PLAVIX) tablet 75 mg  75 mg Oral Q breakfast Troy Sine, MD   75 mg at 04/04/20 0935  . Darbepoetin Alfa (ARANESP) injection 60 mcg  60 mcg Intravenous Q Mon-HD Pearson Grippe B, MD   60 mcg at 04/02/20 1726  . diazepam (VALIUM) tablet 5 mg  5 mg Oral Q6H PRN Troy Sine, MD   5 mg at 04/02/20 2251  . ferric gluconate (NULECIT) 125 mg in sodium chloride 0.9 % 100 mL IVPB  125 mg Intravenous Q T,Th,Sa-HD Pearson Grippe B, MD 110 mL/hr at 04/03/20 1600 125 mg at 04/03/20 1600  . heparin injection 5,000 Units  5,000 Units Subcutaneous Q8H Troy Sine, MD   5,000 Units at 04/04/20 1357  . hydrALAZINE (APRESOLINE) tablet 100 mg  100 mg Oral TID Leonie Man, MD   100 mg at 04/04/20 0935  . insulin aspart (novoLOG) injection 0-6 Units  0-6 Units Subcutaneous TID WC Blount, Lolita Cram, NP   3 Units at 04/04/20 1358  . isosorbide mononitrate (IMDUR) 24 hr tablet 30 mg  30 mg Oral Daily Leonie Man, MD   30 mg at 04/04/20 0935  . loperamide (IMODIUM) capsule 2 mg  2 mg Oral PRN Marylyn Ishihara, Tyrone A, DO   2 mg at 03/29/20 1854  . loratadine (CLARITIN) tablet 10 mg  10 mg Oral Daily Kyle, Tyrone A, DO   10 mg at 04/04/20 0935  . metoprolol succinate (TOPROL-XL) 24 hr tablet 50 mg  50 mg Oral Daily Leonie Man, MD   50 mg at 04/04/20 0936  . nicotine (NICODERM CQ - dosed in mg/24 hours) patch 14 mg  14 mg Transdermal Daily Georgette Shell, MD   14 mg at 04/04/20 0936  . ondansetron (ZOFRAN) injection 4 mg  4 mg Intravenous Q6H PRN Troy Sine, MD   4 mg at 04/01/20 1455  . promethazine (PHENERGAN) injection 12.5 mg  12.5 mg Intravenous Q6H PRN Marylyn Ishihara, Tyrone A, DO   12.5 mg at 04/01/20 1700  . sodium chloride flush (NS) 0.9 % injection 3 mL  3 mL Intravenous Q12H Troy Sine, MD   3 mL at 04/04/20 0937  . sodium chloride flush (NS) 0.9 % injection 3 mL   3 mL Intravenous PRN Troy Sine, MD      . traZODone (DESYREL) tablet 50 mg  50 mg Oral QHS PRN Chotiner, Yevonne Aline, MD   50 mg at 04/03/20 2109     Discharge Medications: Please see discharge summary for a list of discharge medications.  Relevant Imaging Results:  Relevant Lab Results:   Additional Information [id number]  Trula Ore, LCSWA

## 2020-04-04 NOTE — Progress Notes (Addendum)
PROGRESS NOTE  William Schroeder WGN:[id number] DOB: 11/17/1960 DOA: 03/23/2020 PCP: [facility]   LOS: 12 days   Brief narrative: As per HPI,  William Schroeder a 59 y.o.malewith medical history significant forinsulin-dependent diabetes mellitus, history of CVA, hypertension, and chronic kidney disease stage V, presented to the emergency department for evaluation of shortness of breath. Patient reported some chronic dyspnea and notes that he typically sleeps in a recliner because of shortness of breath and back pain when he tries to lay flat.His shortness of breath was worse than usual on 03/22/2020, he tried to sleep been a bed that night, but developed severe worsening in his dyspnea and felt as though he cannot catch his breath even when he sat back up. His ex-wife was with him, noted that he appeared pale and struggling to breathe, and EMS was called.  Patient was then admitted to hospital.  Assessment/Plan:  Principal Problem:   Hypervolemia associated with renal insufficiency Active Problems:   Diabetes mellitus, type 2 (HCC)   Hypertension   CKD (chronic kidney disease), stage V (HCC)   Acute respiratory failure with hypoxia (HCC)   Non-STEMI (non-ST elevated myocardial infarction) ([place])   Hyperkalemia   NSTEMI Patient presented with worsening shortness of breath.  Chest x-ray showed pulmonary edema with labs showing elevated troponins.  Patient was seen by cardiology and underwent LHC 03/26/2020: Coronary calcification with a 90% calcified proximal LAD stenosis, 30% first diagonal 30% LAD stenosis after the diagonal takeoff; dominant left circumflex coronary artery with calcification in 20 and 80% circumflex marginal stenoses, 30 and 80% PDA stenosis; small nondominant RCA. s/pcoronary atherectomyto LAD continue ASA/plavix/statin as per cardiology recommendation.  AKI on CKD stage V/Metabolic acidosis/Hyperphosphatemia Patient underwent tunneled hemodialysis  catheter placement on 7/27.  Currently on hemodialysis and awaiting for outpatient hemodialysis set up.  Nephrology on board.   Diabetes mellitus type 2.  Latest hemoglobin A1c was 7.2.  Continue sliding scale insulin Accu-Cheks diabetic diet.  Latest POC glucose of 169.  Hx of stroke  on aspirin and statin.  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  Underwent cardiac cath.2D echocardiogram 03/24/2020 ejection fraction 45% anteroseptal wall is hypokinetic entire apex is hypokinetic left ventricle demonstrates regional wall motion abnormalities mild LVH. Right ventricular systolic function normal.  Nephrology on board.  On hemodialysis.  Continue metoprolol Imdur.  Nephrology to decide on diuretics.  Continue fluid restriction.  anemia of chronic renal disease  Latest hemoglobin of 7.8 .  No evidence of bleeding.  Follow CBC.  Receiving ferric gluconate.  essential hypertension continue toprol, imdur, hydralazine; titrate as needed  Hx of hyperlipidemia On Lipitor 80 mg daily.  Hx of tobacco abuse  - continue nicotine patch  Debility, deconditioning.  Physical therapy has seen the patient and recommended skilled nursing facility placement on discharge.  DVT prophylaxis: heparin injection 5,000 Units Start: 03/29/20 0600 SCD's Start: 03/28/20 1543   Code Status:  Full code  Family Communication: None  Status is: Inpatient  Remains inpatient appropriate because:Inpatient level of care appropriate due to severity of illness and Waiting for hemodialysis setup, PT recommending skilled nursing facility placement.   Dispo: The patient is from: Home              Anticipated d/c is to: SNF              Anticipated d/c date is: 2 days  Patient currently is medically stable to d/c.    Consultants:  Cardiology  Nephrology  Procedures: [facility] 03/26/2020: With  atherectomy  Antibiotics:  . None   Subjective: Today, patient was seen and examined at bedside.  Patient denies any chest pain, shortness of breath abdominal pain fever or chills.  Denies dyspnea. Had vomiting yesterday.  Objective: Vitals:   04/03/20 2136 04/04/20 0637  BP: (!) 142/75 (!) 144/76  Pulse: 72 71  Resp: 16 15  Temp: 98.4 F (36.9 C) 98.2 F (36.8 C)  SpO2: 97% 98%    Intake/Output Summary (Last 24 hours) at 04/04/2020 0747 Last data filed at 04/03/2020 2000 Gross per 24 hour  Intake 360 ml  Output 1000 ml  Net -640 ml   Filed Weights   04/03/20 0732 04/03/20 1013 04/04/20 0637  Weight: (!) 102.3 kg (!) 101.2 kg (!) 102 kg   Body mass index is 32.25 kg/m.   Physical Exam:  GENERAL: Patient is alert awake and oriented. Not in obvious distress.  Obese HENT: No scleral pallor or icterus. Pupils equally reactive to light. Oral mucosa is moist NECK: is supple, no gross swelling noted. CHEST: Clear to auscultation. No crackles or wheezes.  Diminished breath sounds bilaterally. CVS: S1 and S2 heard, no murmur. Regular rate and rhythm.  ABDOMEN: Soft, non-tender, bowel sounds are present. EXTREMITIES: Bilateral lower extremity trace edema.  Left upper extremity fistula.  Right internal jugular tunneled catheter. CNS: Cranial nerves are intact. No focal motor deficits. SKIN: warm and dry without rashes.  Data Review: I have personally reviewed the following laboratory data and studies,  CBC: Recent Labs  Lab 03/30/20 0513 03/31/20 0541 04/01/20 0815 04/02/20 0323 04/03/20 0332  WBC 6.5 6.2 6.6 6.5 7.8  NEUTROABS 3.3 3.3  --  3.5 4.6  HGB 7.7* 7.0* 8.3* 7.5* 7.8*  HCT 23.9* 22.0* 25.6* 23.5* 24.5*  MCV 93.7 95.2 93.4 93.6 93.2  PLT 196 201 222 208 008   Basic Metabolic Panel: Recent Labs  Lab 03/30/20 0513 03/31/20 0541 04/01/20 0815 04/02/20 0323 04/03/20 0332  NA 137 137 140 139 139  K 4.4 4.0 3.9 4.7 3.5  CL 108 106 107 109 107  CO2 19*  19* 21* 16* 21*  GLUCOSE 142* 138* 158* 168* 148*  BUN 54* 56* 55* 57* 40*  CREATININE 6.44* 7.52* 8.04* 8.00* 6.51*  CALCIUM 8.9 8.8* 8.7* 8.4* 8.5*  MG 2.4 2.4 2.4 2.4 2.1  PHOS 6.0* 6.7* 6.8* 6.4* 4.9*   Liver Function Tests: Recent Labs  Lab 03/30/20 0513 03/31/20 0541 04/01/20 0815 04/02/20 0323 04/03/20 0332  ALBUMIN 2.4* 2.4* 2.5* 2.4* 2.4*   No results for input(s): LIPASE, AMYLASE in the last 168 hours. No results for input(s): AMMONIA in the last 168 hours. Cardiac Enzymes: No results for input(s): CKTOTAL, CKMB, CKMBINDEX, TROPONINI in the last 168 hours. BNP (last 3 results) No results for input(s): BNP in the last 8760 hours.  ProBNP (last 3 results) No results for input(s): PROBNP in the last 8760 hours.  CBG: Recent Labs  Lab 04/02/20 2225 04/03/20 1212 04/03/20 1623 04/03/20 2131 04/04/20 0744  GLUCAP 234* 162* 177* 218* 169*   No results found for this or any previous visit (from the past 240 hour(s)).   Studies: DG Chest 2 View  Result Date: 04/03/2020 CLINICAL DATA:  In stage renal disease. EXAM: CHEST - 2 VIEW COMPARISON:  03/23/2020 FINDINGS: A right jugular catheter has been placed and terminates over the lower SVC. The cardiomediastinal  silhouette is grossly unchanged allowing for rightward patient rotation on today's study. Lung volumes are low, and there are small bilateral pleural effusions. No airspace consolidation, overt pulmonary edema, or pneumothorax is identified. No acute osseous abnormality is seen. IMPRESSION: 1. Interval right jugular catheter placement. 2. Low lung volumes with small bilateral pleural effusions. Electronically Signed   By: Logan Bores M.D.   On: 04/03/2020 14:01   IR Fluoro Guide CV Line Right  Result Date: 04/02/2020 INDICATION: 59 year old male with a history of renal dysfunction referred tunneled hemodialysis catheter EXAM: TUNNELED CENTRAL VENOUS HEMODIALYSIS CATHETER PLACEMENT WITH ULTRASOUND AND FLUOROSCOPIC  GUIDANCE MEDICATIONS: 2 g Ancef. The antibiotic was given in an appropriate time interval prior to skin puncture. ANESTHESIA/SEDATION: Moderate (conscious) sedation was employed during this procedure. A total of Versed 1.0 mg and Fentanyl 50 mcg was administered intravenously. Moderate Sedation Time: 19 minutes. The patient's level of consciousness and vital signs were monitored continuously by radiology nursing throughout the procedure under my direct supervision. FLUOROSCOPY TIME:  Fluoroscopy Time: 0 minutes 30 seconds (7 mGy). COMPLICATIONS: None PROCEDURE: Informed written consent was obtained from the patient after a discussion of the risks, benefits, and alternatives to treatment. Questions regarding the procedure were encouraged and answered. The right neck and chest were prepped with chlorhexidine in a sterile fashion, and a sterile drape was applied covering the operative field. Maximum barrier sterile technique with sterile gowns and gloves were used for the procedure. A timeout was performed prior to the initiation of the procedure. Ultrasound survey was performed. Micropuncture kit was utilized to access the right internal jugular vein under direct, real-time ultrasound guidance after the overlying soft tissues were anesthetized with 1% lidocaine with epinephrine. Stab incision was made with 11 blade scalpel. Microwire was passed centrally. The microwire was then marked to measure appropriate internal catheter length. External tunneled length was estimated. A total tip to cuff length of 23 cm was selected. 035 guidewire was advanced to the level of the IVC. Skin and subcutaneous tissues of chest wall below the clavicle were generously infiltrated with 1% lidocaine for local anesthesia. A small stab incision was made with 11 blade scalpel. The selected hemodialysis catheter was tunneled in a retrograde fashion from the anterior chest wall to the venotomy incision. Serial dilation was performed and then a  peel-away sheath was placed. The catheter was then placed through the peel-away sheath with tips ultimately positioned within the superior aspect of the right atrium. Final catheter positioning was confirmed and documented with a spot radiographic image. The catheter aspirates and flushes normally. The catheter was flushed with appropriate volume heparin dwells. The catheter exit site was secured with a 0-Prolene retention suture. Gel-Foam slurry was infused into the soft tissue tract. There was some oozing at the skin entry site at the subcutaneous tract. Additional manual pressure was used, as well as an additional 0 Prolene suture, which can be removed in 5-7 days at dialysis. The venotomy incision was closed Derma bond and sterile dressing. Dressings were applied at the chest wall. Patient tolerated the procedure well and remained hemodynamically stable throughout. No complications were encountered and no significant blood loss encountered. IMPRESSION: Status post right IJ tunneled hemodialysis catheter placement. Catheter ready for use. Signed, Dulcy Fanny. Dellia Nims, RPVI [facility] Radiology Electronically Signed   By: Corrie Mckusick D.O.   On: 04/02/2020 12:42   IR US Guide Vasc Access Right  Result Date: 04/02/2020 INDICATION: 59 year old male with a history  of renal dysfunction referred tunneled hemodialysis catheter EXAM: TUNNELED CENTRAL VENOUS HEMODIALYSIS CATHETER PLACEMENT WITH ULTRASOUND AND FLUOROSCOPIC GUIDANCE MEDICATIONS: 2 g Ancef. The antibiotic was given in an appropriate time interval prior to skin puncture. ANESTHESIA/SEDATION: Moderate (conscious) sedation was employed during this procedure. A total of Versed 1.0 mg and Fentanyl 50 mcg was administered intravenously. Moderate Sedation Time: 19 minutes. The patient's level of consciousness and vital signs were monitored continuously by radiology nursing throughout the procedure under my  direct supervision. FLUOROSCOPY TIME:  Fluoroscopy Time: 0 minutes 30 seconds (7 mGy). COMPLICATIONS: None PROCEDURE: Informed written consent was obtained from the patient after a discussion of the risks, benefits, and alternatives to treatment. Questions regarding the procedure were encouraged and answered. The right neck and chest were prepped with chlorhexidine in a sterile fashion, and a sterile drape was applied covering the operative field. Maximum barrier sterile technique with sterile gowns and gloves were used for the procedure. A timeout was performed prior to the initiation of the procedure. Ultrasound survey was performed. Micropuncture kit was utilized to access the right internal jugular vein under direct, real-time ultrasound guidance after the overlying soft tissues were anesthetized with 1% lidocaine with epinephrine. Stab incision was made with 11 blade scalpel. Microwire was passed centrally. The microwire was then marked to measure appropriate internal catheter length. External tunneled length was estimated. A total tip to cuff length of 23 cm was selected. 035 guidewire was advanced to the level of the IVC. Skin and subcutaneous tissues of chest wall below the clavicle were generously infiltrated with 1% lidocaine for local anesthesia. A small stab incision was made with 11 blade scalpel. The selected hemodialysis catheter was tunneled in a retrograde fashion from the anterior chest wall to the venotomy incision. Serial dilation was performed and then a peel-away sheath was placed. The catheter was then placed through the peel-away sheath with tips ultimately positioned within the superior aspect of the right atrium. Final catheter positioning was confirmed and documented with a spot radiographic image. The catheter aspirates and flushes normally. The catheter was flushed with appropriate volume heparin dwells. The catheter exit site was secured with a 0-Prolene retention suture. Gel-Foam  slurry was infused into the soft tissue tract. There was some oozing at the skin entry site at the subcutaneous tract. Additional manual pressure was used, as well as an additional 0 Prolene suture, which can be removed in 5-7 days at dialysis. The venotomy incision was closed Derma bond and sterile dressing. Dressings were applied at the chest wall. Patient tolerated the procedure well and remained hemodynamically stable throughout. No complications were encountered and no significant blood loss encountered. IMPRESSION: Status post right IJ tunneled hemodialysis catheter placement. Catheter ready for use. Signed, Dulcy Fanny. Dellia Nims, RPVI [facility] Radiology Special[facility] Radiology Electronically Signed   By: Corrie Mckusick D.O.   On: 04/02/2020 12:42     Flora Lipps, MD  [facility] 04/04/2020

## 2020-04-04 NOTE — Progress Notes (Signed)
Renal Navigator met with patient to discuss OP HD referral per notification by Dr. Cristal Generous. Patient follows with [facility] and requests OP HD at [facility] in [place]. Referral submitted.  Alphonzo Cruise, [place] Renal Navigator [phone]

## 2020-04-04 NOTE — Progress Notes (Signed)
CARDIAC REHAB PHASE I   PRE:  Rate/Rhythm: 72 SR  BP:  Supine: 135/62  Sitting:   Standing:    SaO2: 94%RA  MODE:  Ambulation: 100 ft   POST:  Rate/Rhythm: 89 SR PVCs  BP:  Supine:   Sitting: 156/64  Standing:    SaO2: 97%RA [id number] Pt sleeping. Pt willing to walk. Pt walked 100 ft on RA with gait belt use, rolling walker and asst x 2. Pt tired easily and weak by end of walk. Difficulty picking up left foot as he came to end of walk. To recliner with call bell and chair alarm. Denied dizziness or CP.   Graylon Good, RN BSN  04/04/2020 9:18 AM

## 2020-04-04 NOTE — Progress Notes (Signed)
Admit: 03/23/2020 LOS: 67  68M progresive CKD5 with NSTEMI  Subjective:   HD#2 yesterday, 1L UF, uneventful  CLIP in process  07/27 0701 - 07/28 0700 In: 360 [P.O.:360] Out: 1000   Filed Weights   04/03/20 0732 04/03/20 1013 04/04/20 0637  Weight: (!) 102.3 kg (!) 101.2 kg (!) 102 kg    Scheduled Meds: . aspirin  81 mg Oral Daily  . atorvastatin  80 mg Oral Daily  . Chlorhexidine Gluconate Cloth  6 each Topical Q0600  . clopidogrel  75 mg Oral Q breakfast  . darbepoetin (ARANESP) injection - DIALYSIS  60 mcg Intravenous Q Mon-HD  . heparin  5,000 Units Subcutaneous Q8H  . hydrALAZINE  100 mg Oral TID  . insulin aspart  0-6 Units Subcutaneous TID WC  . isosorbide mononitrate  30 mg Oral Daily  . loratadine  10 mg Oral Daily  . metoprolol succinate  50 mg Oral Daily  . nicotine  14 mg Transdermal Daily  . sodium chloride flush  3 mL Intravenous Q12H   Continuous Infusions: . sodium chloride    . sodium chloride    . ferric gluconate (FERRLECIT/NULECIT) IV 125 mg (04/03/20 1600)   PRN Meds:.sodium chloride, sodium chloride, acetaminophen, diazepam, loperamide, ondansetron (ZOFRAN) IV, promethazine, sodium chloride flush, traZODone  Current Labs: reviewed  Results for William, Schroeder (MRN [id number]) as of 03/25/2020 10:20  Ref. Range 03/24/2020 06:15  Saturation Ratios Latest Ref Range: 17.9 - 39.5 % 36  Ferritin Latest Ref Range: 24 - 336 ng/mL 26    Physical Exam:  Blood pressure (!) 156/67, pulse 70, temperature 98 F (36.7 C), temperature source Oral, resp. rate 16, height 5\' 10"  (1.778 m), weight (!) 102 kg, SpO2 99 %. GEN: NAD. Sitting up in chair ENT: NCAT; R IJ TDC EYES: EOMI CV: RRR no rub PULM: normal wob ABD: s/nt/nd SKIN: no rashes/lesions; LUE AVF incision well healed EXT:no sig LEE Neuro: speech clear and coherent, no asterixis LUE AVF +B/T   Recent Labs  Lab 04/01/20 0815 04/02/20 0323 04/03/20 0332  NA 140 139 139  K 3.9 4.7 3.5  CL 107  109 107  CO2 21* 16* 21*  GLUCOSE 158* 168* 148*  BUN 55* 57* 40*  CREATININE 8.04* 8.00* 6.51*  CALCIUM 8.7* 8.4* 8.5*  PHOS 6.8* 6.4* 4.9*   Recent Labs  Lab 03/31/20 0541 03/31/20 0541 04/01/20 0815 04/02/20 0323 04/03/20 0332  WBC 6.2   < > 6.6 6.5 7.8  NEUTROABS 3.3  --   --  3.5 4.6  HGB 7.0*   < > 8.3* 7.5* 7.8*  HCT 22.0*   < > 25.6* 23.5* 24.5*  MCV 95.2   < > 93.4 93.6 93.2  PLT 201   < > 222 208 223   < > = values in this interval not displayed.    A 1. 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  Started HD 7/26 2. SOB / Hypoxia / Pulm Edema, improved 3. NSTEMI s/p LAD DES 03/28/20 (175mL contrast utilized) 4. Recent but not mature LUE AVF +B/T, healing well 5. DM2 6. HTN 7. Anemia of chronic kidney disease. S/p feraheme, s/p 1u prbc 7/24, 7s-8s 8. CKDBMD: P 6s, CTM for now while starting HD 9. Metabolic acidosis secondary to #1, improved  P . HD#3 tomorrow: 3K 3.5h, 1L UF, No heparin, 300/500 . CLIP, likley to [facility]; anticipate THS . Cont ESA today with HD . Renal diet . Daily  weights, Daily Renal Panel, Strict I/Os, Avoid nephrotoxins (NSAIDs, judicious IV Contrast)    Rexene Agent [employer]

## 2020-04-04 NOTE — Progress Notes (Signed)
Patient has been accepted for OP HD treatment at [facility] on a MWF schedule with a seat time of 11:30am. He needs to arrive at 10:45am on his first day at the clinic in order to complete intake paperwork prior to his first treatment. Nephrologist/Dr. Joelyn Oms updated. Navigator met with patient to inform. He is appreciative and agreeable. Navigator understands that patient is being worked up for SNF and will follow for discharge planning.  William Schroeder, [place] Renal Navigator [phone]

## 2020-04-04 NOTE — TOC Initial Note (Addendum)
Transition of Care [facility]) - Initial/Assessment Note    Patient Details  Name: William Schroeder MRN: [id number] Date of Birth: Apr 03, 1961  Transition of Care [facility]) CM/SW Contact:    Trula Ore, [place] Phone Number: 04/04/2020, 3:29 PM  Clinical Narrative:                  CSW spoke with patient at bedside. Patient is agreeable to SNF placement. Patient gave CSW permission to discuss his care with his daughter Marzetta Board. Patient gave CSW permission to fax out initial referral to [place] and [place] area. Patient has had both Covid Vaccines. CSW working with renal navigator Terri Piedra to help coordinate dialysis for patient.  Pending bed offers.  CSW will continue to follow.  Expected Discharge Plan: Skilled Nursing Facility Barriers to Discharge: Continued Medical Work up   Patient Goals and CMS Choice Patient states their goals for this hospitalization and ongoing recovery are:: to go to SNF CMS Medicare.gov Compare Post Acute Care list provided to:: Patient Choice offered to / list presented to : Patient  Expected Discharge Plan and Services Expected Discharge Plan: [place] In-house Referral: NA Discharge Planning Services: CM Consult Post Acute Care Choice: NA Living arrangements for the past 2 months: Single Family Home Expected Discharge Date: 03/27/20               DME Arranged: N/A         HH Arranged: NA          Prior Living Arrangements/Services Living arrangements for the past 2 months: Single Family Home Lives with:: Self (has support of daughter.) Patient language and need for interpreter reviewed:: Yes Do you feel safe going back to the place where you live?: No   SNF  Need for Family Participation in Patient Care: Yes (Comment) Care giver support system in place?: Yes (comment)   Criminal Activity/Legal Involvement Pertinent to Current Situation/Hospitalization: No - Comment as needed  Activities of Daily Living Home Assistive  Devices/Equipment: Blood pressure cuff, CBG Meter, Dentures (specify type), Hand-held shower hose, Scales, Walker (specify type) ADL Screening (condition at time of admission) Patient's cognitive ability adequate to safely complete daily activities?: Yes Is the patient deaf or have difficulty hearing?: No Does the patient have difficulty seeing, even when wearing glasses/contacts?: No Does the patient have difficulty concentrating, remembering, or making decisions?: No Patient able to express need for assistance with ADLs?: Yes Does the patient have difficulty dressing or bathing?: No Independently performs ADLs?: Yes (appropriate for developmental age) Does the patient have difficulty walking or climbing stairs?: Yes Weakness of Legs: Both Weakness of Arms/Hands: None  Permission Sought/Granted Permission sought to share information with : Case Manager, Customer service manager, Family Supports Permission granted to share information with : Yes, Verbal Permission Granted  Share Information with NAME: Marzetta Board  Permission granted to share info w AGENCY: SNFs  Permission granted to share info w Relationship: Daughter  Permission granted to share info w Contact Information: Marzetta Board [phone]  Emotional Assessment Appearance:: Appears stated age Attitude/Demeanor/Rapport: Gracious Affect (typically observed): Calm Orientation: : Oriented to Self, Oriented to Place, Oriented to  Time, Oriented to Situation Alcohol / Substance Use: Not Applicable Psych Involvement: No (comment)  Admission diagnosis:  Hypervolemia associated with renal insufficiency [E87.70, N28.9] Patient Active Problem List   Diagnosis Date Noted  . CKD (chronic kidney disease), stage V ([place]) 03/23/2020  . Acute respiratory failure with hypoxia ([place]) 03/23/2020  . Non-STEMI (non-ST elevated myocardial infarction) (  [place]) 03/23/2020  . Hypervolemia associated with renal insufficiency 03/23/2020  . Hyperkalemia  03/23/2020  . Carpal tunnel syndrome 10/18/2014  . Major depressive disorder, recurrent, severe without psychotic features ([place])   . Tobacco use disorder, moderate, dependence   . Major depressive disorder, single episode, severe without psychotic features ([place])   . Depression 07/06/2014  . Diabetes mellitus, type 2 ([place]) 07/06/2014  . Hypertension 07/06/2014  . Dehydration 04/12/2013  . Gastroparesis 04/12/2013  . Syncope 04/11/2013  . Diabetes mellitus ([place]) 04/11/2013  . Abdominal pain, epigastric 10/27/2012  . Barrett's esophagus 10/27/2012  . Early satiety 08/02/2012  . Anorexia 08/02/2012  . Epigastric pain 08/02/2012  . Bowel habit changes 08/02/2012  . Constipation 08/02/2012  . FH: pancreatic cancer 08/02/2012  . Chronic, continuous use of opioids 08/02/2012  . Chest pain 02/23/2001   PCP:  Clinic, [place]:   [facility] [address] [place] [address] Phone: [phone] Fax: [place], [place] - [place] [phone] [place] [address] Phone: [phone] Fax: [phone]     Social Determinants of Health (SDOH) Interventions    Readmission Risk Interventions Readmission Risk Prevention Plan 03/27/2020  Transportation Screening Complete  Medication Review ([place]) Complete  PCP or Specialist appointment within 3-5 days of discharge Complete  HRI or [place] Complete  SW Recovery Care/Counseling Consult Complete  [place] Not Applicable  Some recent data might be hidden

## 2020-04-04 NOTE — Progress Notes (Signed)
Chaplain responded to referral from Palliative Chaplain to notarize AD.  Chaplain initiated a relationship of care and concern for pt prior to moving to notary function.  Pt was sitting in chair, smiled and nodded but was reluctant to engage in conversation with Chaplain.  Chaplain notarized document.  De Burrs Chaplain Resident

## 2020-04-05 DIAGNOSIS — N185 Chronic kidney disease, stage 5: Secondary | ICD-10-CM | POA: Diagnosis not present

## 2020-04-05 DIAGNOSIS — I214 Non-ST elevation (NSTEMI) myocardial infarction: Secondary | ICD-10-CM | POA: Diagnosis not present

## 2020-04-05 DIAGNOSIS — J9601 Acute respiratory failure with hypoxia: Secondary | ICD-10-CM | POA: Diagnosis not present

## 2020-04-05 DIAGNOSIS — E8770 Fluid overload, unspecified: Secondary | ICD-10-CM | POA: Diagnosis not present

## 2020-04-05 LAB — BASIC METABOLIC PANEL
Anion gap: 10 (ref 5–15)
BUN: 27 mg/dL — ABNORMAL HIGH (ref 8–23)
CO2: 24 mmol/L (ref 22–32)
Calcium: 9 mg/dL (ref 8.9–10.3)
Chloride: 104 mmol/L (ref 98–111)
Creatinine, Ser: 5.49 mg/dL — ABNORMAL HIGH (ref 0.61–1.24)
GFR calc Af Amer: 12 mL/min — ABNORMAL LOW (ref >60)
GFR calc non Af Amer: 10 mL/min — ABNORMAL LOW (ref >60)
Glucose, Bld: 157 mg/dL — ABNORMAL HIGH (ref 70–99)
Potassium: 3.6 mmol/L (ref 3.5–5.1)
Sodium: 138 mmol/L (ref 135–145)

## 2020-04-05 LAB — GLUCOSE, CAPILLARY
Glucose-Capillary: 151 mg/dL — ABNORMAL HIGH (ref 70–99)
Glucose-Capillary: 190 mg/dL — ABNORMAL HIGH (ref 70–99)
Glucose-Capillary: 164 mg/dL — ABNORMAL HIGH (ref 70–99)
Glucose-Capillary: 243 mg/dL — ABNORMAL HIGH (ref 70–99)

## 2020-04-05 LAB — SARS CORONAVIRUS 2 (TAT 6-24 HRS): SARS Coronavirus 2: NEGATIVE

## 2020-04-05 MED ORDER — BISACODYL 10 MG RE SUPP: 10.0000 mg | Freq: Every day | RECTAL | 0 refills | Status: AC | PRN

## 2020-04-05 MED ORDER — ASPIRIN 81 MG PO CHEW: 81.0000 mg | CHEWABLE_TABLET | Freq: Every day | ORAL | Status: DC

## 2020-04-05 MED ORDER — NICOTINE 14 MG/24HR TD PT24: 14.0000 mg | MEDICATED_PATCH | Freq: Every day | TRANSDERMAL | Status: AC

## 2020-04-05 MED ORDER — DOCUSATE SODIUM 100 MG PO CAPS
100.0000 mg | ORAL_CAPSULE | Freq: Two times a day (BID) | ORAL | Status: DC
  Administered 2020-04-05 – 2020-04-06 (×2): 100 mg via ORAL
  Filled 2020-04-05 (×2): qty 1

## 2020-04-05 MED ORDER — HYDRALAZINE HCL 100 MG PO TABS: 100.0000 mg | ORAL_TABLET | Freq: Three times a day (TID) | ORAL | Status: DC

## 2020-04-05 MED ORDER — METOPROLOL SUCCINATE ER 50 MG PO TB24: 50.0000 mg | ORAL_TABLET | Freq: Every day | ORAL | Status: DC

## 2020-04-05 MED ORDER — TRAZODONE HCL 50 MG PO TABS: 50.0000 mg | ORAL_TABLET | Freq: Every evening | ORAL | Status: AC | PRN

## 2020-04-05 MED ORDER — ISOSORBIDE MONONITRATE ER 30 MG PO TB24: 30.0000 mg | ORAL_TABLET | Freq: Every day | ORAL | Status: DC

## 2020-04-05 MED ORDER — INSULIN GLARGINE 100 UNIT/ML ~~LOC~~ SOLN: 10.0000 [IU] | Freq: Every day | SUBCUTANEOUS | Status: AC

## 2020-04-05 MED ORDER — CLOPIDOGREL BISULFATE 75 MG PO TABS: 75.0000 mg | ORAL_TABLET | Freq: Every day | ORAL | Status: DC

## 2020-04-05 MED ORDER — POLYETHYLENE GLYCOL 3350 17 G PO PACK: 17.0000 g | PACK | Freq: Every day | ORAL | Status: DC

## 2020-04-05 MED ORDER — BISACODYL 10 MG RE SUPP
10.0000 mg | Freq: Every day | RECTAL | Status: DC
  Filled 2020-04-05: qty 1

## 2020-04-05 NOTE — Care Management Important Message (Signed)
Important Message  Patient Details  Name: William Schroeder MRN: [id number] Date of Birth: [birth year]   Medicare Important Message Given:  Yes     Shelda Altes 04/05/2020, 11:41 AM

## 2020-04-05 NOTE — TOC Progression Note (Addendum)
Transition of Care [facility]) - Progression Note    Patient Details  Name: BARNEY RUSSOMANNO MRN: [id number] Date of Birth: [birth year]  Transition of Care [facility]) CM/SW [place], [place] Phone Number: 04/05/2020, 1:08 PM  Clinical Narrative:     Update 7/29-1:42pm-Patient has decided to go to [facility] for SNF placement.CSW called [facility] and spoke with Mardene Celeste who confirmed bed offer. CSW spoke with Renal Navigator Terri Piedra to coordinate dialysis for patient. CSW let Mardene Celeste know patients dialysis days and time that he needs to be a dialysis.  CSW will continue to follow.  CSW spoke with patient at bedside. CSW provided patient with SNF bed offers. Patient wants to go over offers with patients daughter. CSW will follow back up with patient at the end of day to see if patient has decided on SNF facility. CSW will coordinate with facility and renal navigator after choice is made.  CSW will continue to follow.  Expected Discharge Plan: [place] Barriers to Discharge: Continued Medical Work up  Expected Discharge Plan and Services Expected Discharge Plan: [place] In-house Referral: NA Discharge Planning Services: CM Consult Post Acute Care Choice: NA Living arrangements for the past 2 months: Single Family Home Expected Discharge Date: 03/27/20               DME Arranged: N/A         HH Arranged: NA           Social Determinants of Health (SDOH) Interventions    Readmission Risk Interventions Readmission Risk Prevention Plan 03/27/2020  Transportation Screening Complete  Medication Review Press photographer) Complete  PCP or Specialist appointment within 3-5 days of discharge Complete  HRI or [place] Complete  SW Recovery Care/Counseling Consult Complete  [place] Not Applicable  Some recent data might be hidden

## 2020-04-05 NOTE — Discharge Summary (Signed)
Physician Discharge Summary  [name] VPX:[id number] DOB: 1961/03/04 DOA: 03/23/2020  PCP: [facility] [place]  Admit date: 03/23/2020   Discharge date: 04/06/2020  Admitted From: Home  Discharge disposition: SNF   Recommendations for Outpatient Follow-Up:   . Follow up with your primary care provider at the skilled nursing facility in 3 to 5 days. . Check CBC, BMP, magnesium in the next visit. . Follow-up with cardiology Dr. Debara Pickett for regular cardiac checkup in 2 weeks. . Continue hemodialysis as scheduled. . Patient has history of constipation and diarrhea.  Currently constipated and is on bowel regimen.  Please de-escalate bowel regimen once he starts having diarrhea.   Discharge Diagnosis:   Principal Problem:   Hypervolemia associated with renal insufficiency Active Problems:   Diabetes mellitus, type 2 (HCC)   Hypertension   CKD (chronic kidney disease), stage V (HCC)   Acute respiratory failure with hypoxia (HCC)   Non-STEMI (non-ST elevated myocardial infarction) (HCC)   Hyperkalemia   Discharge Condition: Improved.  Diet recommendation: .  Carbohydrate-modified/heart healthy  Wound care: None.  Code status: Full.   History of Present Illness:   William Schroeder a 59 y.o.malewith medical history significant forinsulin-dependent diabetes mellitus, history of CVA, hypertension, and chronic kidney disease stage V, presented to the emergency department for evaluation of shortness of breath. Patient reported some chronic dyspnea and notes that he typically sleeps in a recliner because of shortness of breath and back pain when he tries to lay flat.His shortness of breath was worse than usual on 03/22/2020, he tried to sleep been a bed that night, but developed severe worsening in his dyspnea and felt as though he cannot catch his breath even when he sat back up. His ex-wife was with him, noted that he appeared pale and struggling to breathe, and EMS  was called.  Patient was then admitted to hospital.   Hospital Course:   Following conditions were addressed during hospitalization as listed below,  NSTEMI Patient presented with worsening shortness of breath.  Chest x-ray showed pulmonary edema with labs showing elevated troponins.  Patient was seen by cardiology and underwent LHC 03/26/2020: Coronary calcification with a 90% calcified proximal LAD stenosis, 30% first diagonal 30% LAD stenosis after the diagonal takeoff; dominant left circumflex coronary artery with calcification in 20 and 80% circumflex marginal stenoses, 30 and 80% PDA stenosis; small nondominant RCA. s/pcoronary atherectomyto LAD continue ASA/plavix/statin as per cardiology recommendation. Continue nicotine patch on discharge.  AKI on CKD stage V/Metabolic acidosis/Hyperphosphatemia Patient underwent tunneled hemodialysis catheter placement on 7/27. Patient has been seen by nephrology and recommend outpatient hemodialysis Monday, Wednesday, Friday schedule.  Diabetes mellitus type 2.  Latest hemoglobin A1c was 7.2.   Diabetic diet. Continue Lantus but will reduce dose to 10 units at night.  During hospitalization patient received sliding scale insulin.  Hx of stroke  on aspirin and statin.  Continue on discharge.  Acutecombinedheart failure with pulmonary edema Initially received high-dose Lasix.  Underwent cardiac cath on 03/26/20.2D echocardiogram 03/24/2020 ejection fraction 45% anteroseptal wall is hypokinetic entire apex is hypokinetic left ventricle demonstrates regional wall motion abnormalities mild LVH. Right ventricular systolic function normal.  Nephrology followed during hospitalization and patient will continue hemodialysis on discharge for fluid management.  anemia of chronic renal disease  Latest hemoglobin of 8.5 .  No evidence of bleeding.   Received IV ferric gluconate while in hospital. We will continue with iron supplements on  discharge.  essential hypertension continue toprol, imdur, hydralazine;  on discharge.  Hx of hyperlipidemia On Lipitor 80 mg daily. Will need to continue  Hx of tobacco abuse  - continue nicotine patch  Debility, deconditioning.  Physical therapy has seen the patient and recommended skilled nursing facility placement on discharge.  Disposition.  At this time, patient is stable for disposition to skilled nursing facility.  Spoke with the patient's daughter 7/29 about disposition.  Medical Consultants:    Cardiology  Nephrology  Procedures:    [facility] 03/26/2020: With atherectomy Right internal jugular hemodialysis catheter placement 7/27 Hemodialysis  Subjective:   Today, patient feels okay except for some mild weakness. Denies any shortness of breath, cough fever chills. Feels constipated.  Discharge Exam:   Vitals:   04/05/20 0530 04/05/20 1009  BP: (!) 130/54 (!) 144/85  Pulse: 66 77  Resp: 15   Temp: 98.4 F (36.9 C)   SpO2: 98%    Vitals:   04/04/20 1946 04/05/20 0004 04/05/20 0530 04/05/20 1009  BP: (!) 160/85 (!) 121/53 (!) 130/54 (!) 144/85  Pulse: 80 70 66 77  Resp: 20 14 15    Temp: 98.1 F (36.7 C) 98.1 F (36.7 C) 98.4 F (36.9 C)   TempSrc: Oral Oral Oral   SpO2: 98% 98% 98%   Weight:   (!) 100.5 kg   Height:       General: Alert awake, not in obvious distress HENT: pupils equally reacting to light,  No scleral pallor or icterus noted. Oral mucosa is moist.  Chest:   Diminished breath sounds bilaterally. No crackles or wheezes.  CVS: S1 &S2 heard. No murmur.  Regular rate and rhythm. Abdomen: Soft, nontender, nondistended.  Bowel sounds are heard.   Extremities: No cyanosis, clubbing or edema.  Peripheral pulses are palpable. Left upper extremity fistula. Right internal jugular tunneled catheter. Bilateral lower extremity trace edema. Psych: Alert, awake and communicative, normal mood CNS:  No cranial nerve deficits.  Power equal in  all extremities.   Skin: Warm and dry.  No rashes noted.  The results of significant diagnostics from this hospitalization (including imaging, microbiology, ancillary and laboratory) are listed below for reference.     Diagnostic Studies:   No results found.   Labs:   Basic Metabolic Panel: Recent Labs  Lab 03/30/20 0513 03/30/20 0513 03/31/20 0541 03/31/20 0541 04/01/20 0815 04/01/20 0815 04/02/20 0323 04/02/20 0323 04/03/20 0332 04/05/20 1027  NA 137   < > 137  --  140  --  139  --  139 138  K 4.4   < > 4.0   < > 3.9   < > 4.7   < > 3.5 3.6  CL 108   < > 106  --  107  --  109  --  107 104  CO2 19*   < > 19*  --  21*  --  16*  --  21* 24  GLUCOSE 142*   < > 138*  --  158*  --  168*  --  148* 157*  BUN 54*   < > 56*  --  55*  --  57*  --  40* 27*  CREATININE 6.44*   < > 7.52*  --  8.04*  --  8.00*  --  6.51* 5.49*  CALCIUM 8.9   < > 8.8*  --  8.7*  --  8.4*  --  8.5* 9.0  MG 2.4  --  2.4  --  2.4  --  2.4  --  2.1  --  PHOS 6.0*  --  6.7*  --  6.8*  --  6.4*  --  4.9*  --    < > = values in this interval not displayed.   GFR Estimated Creatinine Clearance: 16.8 mL/min (A) (by C-G formula based on SCr of 5.49 mg/dL (H)). Liver Function Tests: Recent Labs  Lab 03/30/20 0513 03/31/20 0541 04/01/20 0815 04/02/20 0323 04/03/20 0332  ALBUMIN 2.4* 2.4* 2.5* 2.4* 2.4*   No results for input(s): LIPASE, AMYLASE in the last 168 hours. No results for input(s): AMMONIA in the last 168 hours. Coagulation profile No results for input(s): INR, PROTIME in the last 168 hours.  CBC: Recent Labs  Lab 03/30/20 0513 03/31/20 0541 04/01/20 0815 04/02/20 0323 04/03/20 0332  WBC 6.5 6.2 6.6 6.5 7.8  NEUTROABS 3.3 3.3  --  3.5 4.6  HGB 7.7* 7.0* 8.3* 7.5* 7.8*  HCT 23.9* 22.0* 25.6* 23.5* 24.5*  MCV 93.7 95.2 93.4 93.6 93.2  PLT 196 201 222 208 223   Cardiac Enzymes: No results for input(s): CKTOTAL, CKMB, CKMBINDEX, TROPONINI in the last 168 hours. BNP: Invalid  input(s): POCBNP CBG: Recent Labs  Lab 04/04/20 1106 04/04/20 1629 04/04/20 2109 04/05/20 0755 04/05/20 1107  GLUCAP 260* 175* 206* 151* 190*   D-Dimer No results for input(s): DDIMER in the last 72 hours. Hgb A1c No results for input(s): HGBA1C in the last 72 hours. Lipid Profile No results for input(s): CHOL, HDL, LDLCALC, TRIG, CHOLHDL, LDLDIRECT in the last 72 hours. Thyroid function studies No results for input(s): TSH, T4TOTAL, T3FREE, THYROIDAB in the last 72 hours.  Invalid input(s): FREET3 Anemia work up No results for input(s): VITAMINB12, FOLATE, FERRITIN, TIBC, IRON, RETICCTPCT in the last 72 hours. Microbiology No results found for this or any previous visit (from the past 240 hour(s)).   Discharge Instructions:   Discharge Instructions    Amb Referral to Cardiac Rehabilitation   Complete by: As directed    Diagnosis:  NSTEMI Coronary Stents     After initial evaluation and assessments completed: Virtual Based Care may be provided alone or in conjunction with Phase 2 Cardiac Rehab based on patient barriers.: Yes   Diet - low sodium heart healthy   Complete by: As directed    Discharge instructions   Complete by: As directed    Follow-up with your primary care provider at the skilled nursing facility in 3 to 5 days. Continue hemodialysis as scheduled. Follow-up with cardiology in 2 weeks.   Increase activity slowly   Complete by: As directed    No wound care   Complete by: As directed      Allergies as of 04/06/2020   No Known Allergies     Medication List    STOP taking these medications   amLODipine 5 MG tablet Commonly known as: NORVASC   aspirin 325 MG tablet Replaced by: aspirin EC 81 MG tablet   cholecalciferol 25 MCG (1000 UNIT) tablet Commonly known as: VITAMIN D   DULoxetine 60 MG capsule Commonly known as: CYMBALTA   lisinopril 20 MG tablet Commonly known as: ZESTRIL   metoprolol tartrate 50 MG tablet Commonly known as:  LOPRESSOR   QUEtiapine 25 MG tablet Commonly known as: SEROQUEL   sodium bicarbonate 650 MG tablet     TAKE these medications   aspirin EC 81 MG tablet Take 1 tablet (81 mg total) by mouth daily. Swallow whole. Replaces: aspirin 325 MG tablet   atorvastatin 80 MG tablet Commonly known as: LIPITOR Take  80 mg by mouth at bedtime.   bisacodyl 10 MG suppository Commonly known as: DULCOLAX Place 1 suppository (10 mg total) rectally daily as needed for moderate constipation.   clopidogrel 75 MG tablet Commonly known as: PLAVIX Take 1 tablet (75 mg total) by mouth daily with breakfast.   ferrous gluconate 324 MG tablet Commonly known as: FERGON Take 324 mg by mouth daily with breakfast.   Fish Oil 1000 MG Caps Take 1,000 mg by mouth 2 (two) times daily.   folic acid 1 MG tablet Commonly known as: FOLVITE Take 1 mg by mouth in the morning.   hydrALAZINE 100 MG tablet Commonly known as: APRESOLINE Take 1 tablet (100 mg total) by mouth 3 (three) times daily. What changed:   medication strength  how much to take   insulin glargine 100 UNIT/ML injection Commonly known as: LANTUS Inject 0.1 mLs (10 Units total) into the skin at bedtime. What changed: how much to take   isosorbide mononitrate 30 MG 24 hr tablet Commonly known as: IMDUR Take 1 tablet (30 mg total) by mouth daily.   metoprolol succinate 50 MG 24 hr tablet Commonly known as: TOPROL-XL Take 1 tablet (50 mg total) by mouth daily. Take with or immediately following a meal.   nicotine 14 mg/24hr patch Commonly known as: NICODERM CQ - dosed in mg/24 hours Place 1 patch (14 mg total) onto the skin daily.   polyethylene glycol 17 g packet Commonly known as: MiraLax Take 17 g by mouth daily. Until bowel movement   thiamine 100 MG tablet Commonly known as: Vitamin B-1 Take 100 mg by mouth in the morning.   traZODone 50 MG tablet Commonly known as: DESYREL Take 1 tablet (50 mg total) by mouth at bedtime  as needed for sleep.             Contact information for follow-up providers    Pixie Casino, MD. Schedule an appointment as soon as possible for a visit in 2 week(s).   Specialty: Cardiology Why: for heart cath followup Contact information: [place] [address] [phone]            Contact information for after-discharge care    Destination    [facility] .   Service: Skilled Nursing Contact information: [address] [phone]                  Time coordinating discharge: 39 minutes  Signed:  Verneal Wiers  [facility] 04/05/2020, 1:58 PM

## 2020-04-05 NOTE — Progress Notes (Signed)
Admit: 03/23/2020 LOS: 29  17M progresive CKD5 with NSTEMI  Subjective:   NAEO, no c/o this AM  CLIP is William Schroeder MWF  To SNF at DC  07/28 0701 - 07/29 0700 In: 360 [P.O.:360] Out: -   Filed Weights   04/03/20 1013 04/04/20 0637 04/05/20 0530  Weight: (!) 101.2 kg (!) 102 kg (!) 100.5 kg    Scheduled Meds: . aspirin  81 mg Oral Daily  . atorvastatin  80 mg Oral Daily  . Chlorhexidine Gluconate Cloth  6 each Topical Q0600  . clopidogrel  75 mg Oral Q breakfast  . darbepoetin (ARANESP) injection - DIALYSIS  60 mcg Intravenous Q Mon-HD  . heparin  5,000 Units Subcutaneous Q8H  . hydrALAZINE  100 mg Oral TID  . insulin aspart  0-6 Units Subcutaneous TID WC  . isosorbide mononitrate  30 mg Oral Daily  . loratadine  10 mg Oral Daily  . metoprolol succinate  50 mg Oral Daily  . nicotine  14 mg Transdermal Daily  . sodium chloride flush  3 mL Intravenous Q12H   Continuous Infusions: . sodium chloride    . sodium chloride    . ferric gluconate (FERRLECIT/NULECIT) IV 125 mg (04/03/20 1600)   PRN Meds:.sodium chloride, sodium chloride, acetaminophen, diazepam, loperamide, ondansetron (ZOFRAN) IV, promethazine, sodium chloride flush, traZODone  Current Labs: reviewed  Results for DETRAVION, TESTER (MRN [id number]) as of 03/25/2020 10:20  Ref. Range 03/24/2020 06:15  Saturation Ratios Latest Ref Range: 17.9 - 39.5 % 36  Ferritin Latest Ref Range: 24 - 336 ng/mL 26    Physical Exam:  Blood pressure (!) 144/85, pulse 77, temperature 98.4 F (36.9 C), temperature source Oral, resp. rate 15, height 5\' 10"  (1.778 m), weight (!) 100.5 kg, SpO2 98 %. GEN: NAD.  ENT: NCAT; R IJ TDC EYES: EOMI CV: RRR no rub PULM: normal wob ABD: s/nt/nd SKIN: no rashes/lesions; LUE AVF incision well healed EXT:no sig LEE Neuro: speech clear and coherent, no asterixis LUE AVF +B/T   Recent Labs  Lab 04/01/20 0815 04/01/20 0815 04/02/20 0323 04/03/20 0332 04/05/20 1027  NA 140   < > 139  139 138  K 3.9   < > 4.7 3.5 3.6  CL 107   < > 109 107 104  CO2 21*   < > 16* 21* 24  GLUCOSE 158*   < > 168* 148* 157*  BUN 55*   < > 57* 40* 27*  CREATININE 8.04*   < > 8.00* 6.51* 5.49*  CALCIUM 8.7*   < > 8.4* 8.5* 9.0  PHOS 6.8*  --  6.4* 4.9*  --    < > = values in this interval not displayed.   Recent Labs  Lab 03/31/20 0541 03/31/20 0541 04/01/20 0815 04/02/20 0323 04/03/20 0332  WBC 6.2   < > 6.6 6.5 7.8  NEUTROABS 3.3  --   --  3.5 4.6  HGB 7.0*   < > 8.3* 7.5* 7.8*  HCT 22.0*   < > 25.6* 23.5* 24.5*  MCV 95.2   < > 93.4 93.6 93.2  PLT 201   < > 222 208 223   < > = values in this interval not displayed.    A 1. 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  Started HD 7/26 2. SOB / Hypoxia / Pulm Edema, improved 3. NSTEMI s/p LAD DES 03/28/20 (115mL contrast utilized) 4. Recent but not mature LUE AVF +B/T, healing well  5. DM2 6. HTN 7. Anemia of chronic kidney disease. S/p feraheme now on nulecit, s/p 1u prbc 7/24, 7s-8s 8. CKDBMD: P 6s, CTM for now while starting HD 9. Metabolic acidosis secondary to #1, improved  P . HD#3 on Friday if inpatient: 3K 3.5h, 1L UF, No heparin, 300/500 . OK to DC today to SNF if can get to HD tomorrow . CLIP completed to [facility] MWF . Renal diet . Daily weights, Daily Renal Panel, Strict I/Os, Avoid nephrotoxins (NSAIDs, judicious IV Contrast)    Rexene Agent  [employer]

## 2020-04-05 NOTE — Progress Notes (Signed)
Physical Therapy Treatment Patient Details Name: William Schroeder MRN: [id number] DOB: March 01, 1961 Today's Date: 04/05/2020    History of Present Illness Pt is a 59 y/o male admitted secondary to SOB and found to have an NSTEMI. Pt now s/p coronary atherectomy of LAD lesion on 7/21. PMH including but not limited to DM, HTN and CVA.    PT Comments    Pt is pleasant as usual, agreeable to walking with therapy. Pt reports he will be going to [facility] for rehab. Pt limited in safe mobility today by decreased command follow. It's obvious that he is hearing commands but difficult to determine whether he is volitionally ignoring them or if he can not achieve them. When asked if he understands commands he answers yes. When asked if he is ignoring them he reports "it's possible.". Either way pt is becoming increasingly unsafe with his ambulation as he continues to get weaker and has limited knowledge of his deficits and safety. Pt is min A for bed mobility, min A x2 for transfers and min A progressing to modA for ambulation with RW. D/c plans remain appropriate. PT will continue to follow acutely.      Follow Up Recommendations  SNF     Equipment Recommendations  None recommended by PT    Recommendations for Other Services       Precautions / Restrictions Precautions Precautions: Fall Restrictions Weight Bearing Restrictions: No    Mobility  Bed Mobility Overal bed mobility: Needs Assistance Bed Mobility: Supine to Sit     Supine to sit: Min assist     General bed mobility comments: with increased time and effort pt able to get LE off bed, requires minA for bringing trunk to upright   Transfers Overall transfer level: Needs assistance Equipment used: Rolling walker (2 wheeled) Transfers: Sit to/from Stand Sit to Stand: From elevated surface;Min assist;+2 physical assistance         General transfer comment: minAx2 for power up into standing, vc for finding balance before  attempting to ambulation   Ambulation/Gait Ambulation/Gait assistance: Min assist;Mod assist;+2 physical assistance;+2 safety/equipment Gait Distance (Feet): 40 Feet Assistive device: Rolling walker (2 wheeled) Gait Pattern/deviations: Step-through pattern;Decreased dorsiflexion - left;Decreased weight shift to left;Steppage;Trunk flexed Gait velocity: varying, too fast for instability    General Gait Details: initiated gait with min A and clear cuing for upright posture and proximity to RW, progressed to min Ax2 as pt continued with ever more increasing flexed forward posture, pushing RW further in front of him, which did not allow for enough room to clear his L LE. requiring modAx2 to support. finally told pt to stop and stand still. Therapy Tech retrieved recliner and pt was rolled back to room          Balance Overall balance assessment: Needs assistance Sitting-balance support: Feet supported Sitting balance-Leahy Scale: Fair     Standing balance support: Bilateral upper extremity supported;During functional activity Standing balance-Leahy Scale: Poor                              Cognition Arousal/Alertness: Awake/alert Behavior During Therapy: Flat affect Overall Cognitive Status: Impaired/Different from baseline Area of Impairment: Safety/judgement;Problem solving;Awareness                         Safety/Judgement: Decreased awareness of safety;Decreased awareness of deficits Awareness: Intellectual Problem Solving: Slow processing;Difficulty sequencing;Requires verbal cues;Requires tactile cues General  Comments: pt with decreased ability to follow commands, difficult to ascertain as to whether he does not understand what is being asked of him or whether he is ignoring commands similarly to tobacco and diet recommendations         General Comments General comments (skin integrity, edema, etc.): VSS on RA      Pertinent Vitals/Pain Pain  Assessment: No/denies pain           PT Goals (current goals can now be found in the care plan section) Acute Rehab PT Goals Patient Stated Goal: to get stronger PT Goal Formulation: With patient Time For Goal Achievement: 04/12/20 Potential to Achieve Goals: Good Progress towards PT goals: Not progressing toward goals - comment (decreased command follow, decreasing safety )    Frequency    Min 2X/week      PT Plan Discharge plan needs to be updated       AM-PAC PT "6 Clicks" Mobility   Outcome Measure  Help needed turning from your back to your side while in a flat bed without using bedrails?: None Help needed moving from lying on your back to sitting on the side of a flat bed without using bedrails?: A Little Help needed moving to and from a bed to a chair (including a wheelchair)?: A Little Help needed standing up from a chair using your arms (e.g., wheelchair or bedside chair)?: A Little Help needed to walk in hospital room?: A Little Help needed climbing 3-5 steps with a railing? : Total 6 Click Score: 17    End of Session Equipment Utilized During Treatment: Gait belt Activity Tolerance: Patient limited by fatigue Patient left: with call bell/phone within reach;in chair;with bed alarm set Nurse Communication: Mobility status PT Visit Diagnosis: Other abnormalities of gait and mobility (R26.89);Muscle weakness (generalized) (M62.81)     Time: 1749-4496 PT Time Calculation (min) (ACUTE ONLY): 20 min  Charges:  $Gait Training: 8-22 mins                     Lee-Anne Flicker B. Migdalia Dk PT, DPT Acute Rehabilitation Services Pager [phone] Office [phone]    [place] 04/05/2020, 3:21 PM

## 2020-04-05 NOTE — Progress Notes (Signed)
PROGRESS NOTE  William Schroeder RCV:[id number] DOB: May 23, 1961 DOA: 03/23/2020 PCP: [facility]   LOS: 13 days   Brief narrative: As per HPI,  William Schroeder a 59 y.o.malewith medical history significant forinsulin-dependent diabetes mellitus, history of CVA, hypertension, and chronic kidney disease stage V, presented to the emergency department for evaluation of shortness of breath. Patient reported some chronic dyspnea and notes that he typically sleeps in a recliner because of shortness of breath and back pain when he tries to lay flat.His shortness of breath was worse than usual on 03/22/2020, he tried to sleep been a bed that night, but developed severe worsening in his dyspnea and felt as though he cannot catch his breath even when he sat back up. His ex-wife was with him, noted that he appeared pale and struggling to breathe, and EMS was called.  Patient was then admitted to hospital.  Assessment/Plan:  Principal Problem:   Hypervolemia associated with renal insufficiency Active Problems:   Diabetes mellitus, type 2 (HCC)   Hypertension   CKD (chronic kidney disease), stage V (HCC)   Acute respiratory failure with hypoxia (HCC)   Non-STEMI (non-ST elevated myocardial infarction) ([place])   Hyperkalemia   NSTEMI Patient presented with worsening shortness of breath.  Chest x-ray showed pulmonary edema with labs showing elevated troponins.  Patient was seen by cardiology and underwent LHC 03/26/2020: Coronary calcification with a 90% calcified proximal LAD stenosis, 30% first diagonal 30% LAD stenosis after the diagonal takeoff; dominant left circumflex coronary artery with calcification in 20 and 80% circumflex marginal stenoses, 30 and 80% PDA stenosis; small nondominant RCA. s/pcoronary atherectomyto LAD. continue ASA/plavix/statin as per cardiology recommendation.  Constipation.  will discontinue loperamide. Add Dulcolax suppository. Add stool softeners.  AKI on  CKD stage V/Metabolic acidosis/Hyperphosphatemia Patient underwent tunneled hemodialysis catheter placement on 7/27.  Currently on hemodialysis and awaiting for outpatient hemodialysis set up.  Nephrology on board.   Diabetes mellitus type 2.  Latest hemoglobin A1c was 7.2.  Continue sliding scale insulin Accu-Cheks diabetic diet.  Latest POC glucose of 151.  Hx of stroke  on aspirin and statin.  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 Initially received high-dose Lasix.  Underwent cardiac cath with atherectomy.2D echocardiogram 03/24/2020 ejection fraction 45% with wall motion abnormalities. Right ventricular systolic function normal.  Nephrology on board and ok for discharge.  On hemodialysis, will need Monday Wednesday Friday schedule..  Continue metoprolol, Imdur.  Nephrology to decide on diuretics in the future.  Continue fluid restriction.  Anemia of chronic renal disease  Latest hemoglobin of 7.8 .  No evidence of bleeding.  Follow CBC.  Receiving ferric gluconate.  Essential hypertension continue toprol, imdur, hydralazine; titrate as needed. Currently adequately controlled.  Hx of hyperlipidemia On Lipitor 80 mg daily.  Hx of tobacco abuse  - continue nicotine patch  Debility, deconditioning.  Physical therapy has seen the patient and recommended skilled nursing facility placement on discharge.  DVT prophylaxis: heparin injection 5,000 Units Start: 03/29/20 0600 SCD's Start: 03/28/20 1543   Code Status:  Full code  Family Communication: I spoke with the patient's daughter Mr. William Schroeder on the phone and updated her about the clinical condition of the patient and the plan for disposition to skilled nursing facility tomorrow.  Status is: Inpatient  Remains inpatient appropriate because:Inpatient level of care appropriate due to severity of illness and Waiting for hemodialysis setup, PT recommending skilled  nursing facility placement.   Dispo: The patient  is from: Home              Anticipated d/c is to: SNF              Anticipated d/c date is: 1 day,after HD today. Spoke with social services.              Patient currently is medically stable to d/c.  Consultants:  Cardiology  Nephrology  Procedures: [facility] 03/26/2020: With atherectomy at LAD Right internal jugular tunnel catheter placement on April 03, 2020 Hemodialysis  Antibiotics:  . None  Subjective: Today, patient complains of generalized weakness. Denies any chest pain, shortness of breath, fever. Complains of constipation and diminished appetite.  Objective: Vitals:   04/05/20 0004 04/05/20 0530  BP: (!) 121/53 (!) 130/54  Pulse: 70 66  Resp: 14 15  Temp: 98.1 F (36.7 C) 98.4 F (36.9 C)  SpO2: 98% 98%    Intake/Output Summary (Last 24 hours) at 04/05/2020 0840 Last data filed at 04/04/2020 2000 Gross per 24 hour  Intake 360 ml  Output --  Net 360 ml   Filed Weights   04/03/20 1013 04/04/20 0637 04/05/20 0530  Weight: (!) 101.2 kg (!) 102 kg (!) 100.5 kg   Body mass index is 31.8 kg/m.   Physical Exam:  ?? GENERAL: Patient is alert awake and oriented. Not in obvious distress.  Obese HENT: No scleral pallor or icterus. Pupils equally reactive to light. Oral mucosa is moist NECK: is supple, no gross swelling noted. CHEST: Clear to auscultation. No crackles or wheezes.  Diminished breath sounds bilaterally. CVS: S1 and S2 heard, no murmur. Regular rate and rhythm.  ABDOMEN: Soft, non-tender, bowel sounds are present. EXTREMITIES: Bilateral lower extremity trace edema.  Left upper extremity fistula.  Right internal jugular tunneled catheter. CNS: Cranial nerves are intact. No focal motor deficits. SKIN: warm and dry without rashes.  Data Review: I have personally reviewed the following laboratory data and studies,  CBC: Recent Labs  Lab 03/30/20 0513 03/31/20 0541 04/01/20 0815 04/02/20 0323  04/03/20 0332  WBC 6.5 6.2 6.6 6.5 7.8  NEUTROABS 3.3 3.3  --  3.5 4.6  HGB 7.7* 7.0* 8.3* 7.5* 7.8*  HCT 23.9* 22.0* 25.6* 23.5* 24.5*  MCV 93.7 95.2 93.4 93.6 93.2  PLT 196 201 222 208 466   Basic Metabolic Panel: Recent Labs  Lab 03/30/20 0513 03/31/20 0541 04/01/20 0815 04/02/20 0323 04/03/20 0332  NA 137 137 140 139 139  K 4.4 4.0 3.9 4.7 3.5  CL 108 106 107 109 107  CO2 19* 19* 21* 16* 21*  GLUCOSE 142* 138* 158* 168* 148*  BUN 54* 56* 55* 57* 40*  CREATININE 6.44* 7.52* 8.04* 8.00* 6.51*  CALCIUM 8.9 8.8* 8.7* 8.4* 8.5*  MG 2.4 2.4 2.4 2.4 2.1  PHOS 6.0* 6.7* 6.8* 6.4* 4.9*   Liver Function Tests: Recent Labs  Lab 03/30/20 0513 03/31/20 0541 04/01/20 0815 04/02/20 0323 04/03/20 0332  ALBUMIN 2.4* 2.4* 2.5* 2.4* 2.4*   No results for input(s): LIPASE, AMYLASE in the last 168 hours. No results for input(s): AMMONIA in the last 168 hours. Cardiac Enzymes: No results for input(s): CKTOTAL, CKMB, CKMBINDEX, TROPONINI in the last 168 hours. BNP (last 3 results) No results for input(s): BNP in the last 8760 hours.  ProBNP (last 3 results) No results for input(s): PROBNP in the last 8760 hours.  CBG: Recent Labs  Lab 04/04/20 0744 04/04/20 1106 04/04/20 1629 04/04/20 2109 04/05/20 0755  GLUCAP 169* 260* 175*  206* 151*   No results found for this or any previous visit (from the past 240 hour(s)).   Studies: DG Chest 2 View  Result Date: 04/03/2020 CLINICAL DATA:  In stage renal disease. EXAM: CHEST - 2 VIEW COMPARISON:  03/23/2020 FINDINGS: A right jugular catheter has been placed and terminates over the lower SVC. The cardiomediastinal silhouette is grossly unchanged allowing for rightward patient rotation on today's study. Lung volumes are low, and there are small bilateral pleural effusions. No airspace consolidation, overt pulmonary edema, or pneumothorax is identified. No acute osseous abnormality is seen. IMPRESSION: 1. Interval right jugular  catheter placement. 2. Low lung volumes with small bilateral pleural effusions. Electronically Signed   By: Logan Bores M.D.   On: 04/03/2020 14:01     Flora Lipps, MD  [facility] 04/05/2020

## 2020-04-05 NOTE — Progress Notes (Signed)
CSW/A. Workman informed Navigator that patient is medically ready to be discharged and has chosen [facility].  Navigator spoke with [facility] clinic, who states that they already have a patient starting tomorrow and, unfortunately, do not have the staff to start another new patient tomorrow. They report that patient will be able to start on Monday, 04/09/20. Navigator informed CSW and MD.   Alphonzo Cruise, [place] Renal Navigator [phone]

## 2020-04-06 DIAGNOSIS — E8770 Fluid overload, unspecified: Secondary | ICD-10-CM | POA: Diagnosis not present

## 2020-04-06 DIAGNOSIS — J9601 Acute respiratory failure with hypoxia: Secondary | ICD-10-CM | POA: Diagnosis not present

## 2020-04-06 DIAGNOSIS — N185 Chronic kidney disease, stage 5: Secondary | ICD-10-CM | POA: Diagnosis not present

## 2020-04-06 DIAGNOSIS — I214 Non-ST elevation (NSTEMI) myocardial infarction: Secondary | ICD-10-CM | POA: Diagnosis not present

## 2020-04-06 LAB — CBC
HCT: 26.6 % — ABNORMAL LOW (ref 39.0–52.0)
HCT: 26.7 % — ABNORMAL LOW (ref 39.0–52.0)
Hemoglobin: 8.5 g/dL — ABNORMAL LOW (ref 13.0–17.0)
Hemoglobin: 8.5 g/dL — ABNORMAL LOW (ref 13.0–17.0)
MCH: 30.6 pg (ref 26.0–34.0)
MCH: 30.7 pg (ref 26.0–34.0)
MCHC: 32 g/dL (ref 30.0–36.0)
MCHC: 31.8 g/dL (ref 30.0–36.0)
MCV: 95.7 fL (ref 80.0–100.0)
MCV: 96.4 fL (ref 80.0–100.0)
Platelets: 253 10*3/uL (ref 150–400)
Platelets: 256 10*3/uL (ref 150–400)
RBC: 2.78 MIL/uL — ABNORMAL LOW (ref 4.22–5.81)
RBC: 2.77 MIL/uL — ABNORMAL LOW (ref 4.22–5.81)
RDW: 13.7 % (ref 11.5–15.5)
RDW: 13.6 % (ref 11.5–15.5)
WBC: 7 10*3/uL (ref 4.0–10.5)
WBC: 7.6 10*3/uL (ref 4.0–10.5)
nRBC: 0 % (ref 0.0–0.2)
nRBC: 0.3 % — ABNORMAL HIGH (ref 0.0–0.2)

## 2020-04-06 LAB — BASIC METABOLIC PANEL
Anion gap: 9 (ref 5–15)
BUN: 30 mg/dL — ABNORMAL HIGH (ref 8–23)
CO2: 25 mmol/L (ref 22–32)
Calcium: 8.8 mg/dL — ABNORMAL LOW (ref 8.9–10.3)
Chloride: 104 mmol/L (ref 98–111)
Creatinine, Ser: 5.74 mg/dL — ABNORMAL HIGH (ref 0.61–1.24)
GFR calc Af Amer: 11 mL/min — ABNORMAL LOW (ref >60)
GFR calc non Af Amer: 10 mL/min — ABNORMAL LOW (ref >60)
Glucose, Bld: 148 mg/dL — ABNORMAL HIGH (ref 70–99)
Potassium: 3.5 mmol/L (ref 3.5–5.1)
Sodium: 138 mmol/L (ref 135–145)

## 2020-04-06 LAB — GLUCOSE, CAPILLARY
Glucose-Capillary: 145 mg/dL — ABNORMAL HIGH (ref 70–99)
Glucose-Capillary: 147 mg/dL — ABNORMAL HIGH (ref 70–99)
Glucose-Capillary: 119 mg/dL — ABNORMAL HIGH (ref 70–99)

## 2020-04-06 LAB — RENAL FUNCTION PANEL
Albumin: 2.5 g/dL — ABNORMAL LOW (ref 3.5–5.0)
Anion gap: 9 (ref 5–15)
BUN: 30 mg/dL — ABNORMAL HIGH (ref 8–23)
CO2: 24 mmol/L (ref 22–32)
Calcium: 8.8 mg/dL — ABNORMAL LOW (ref 8.9–10.3)
Chloride: 105 mmol/L (ref 98–111)
Creatinine, Ser: 5.71 mg/dL — ABNORMAL HIGH (ref 0.61–1.24)
GFR calc Af Amer: 11 mL/min — ABNORMAL LOW (ref >60)
GFR calc non Af Amer: 10 mL/min — ABNORMAL LOW (ref >60)
Glucose, Bld: 142 mg/dL — ABNORMAL HIGH (ref 70–99)
Phosphorus: 4.5 mg/dL (ref 2.5–4.6)
Potassium: 3.5 mmol/L (ref 3.5–5.1)
Sodium: 138 mmol/L (ref 135–145)

## 2020-04-06 MED ORDER — ASPIRIN EC 81 MG PO TBEC: 81.0000 mg | DELAYED_RELEASE_TABLET | Freq: Every day | ORAL | Status: AC

## 2020-04-06 MED ORDER — HEPARIN SODIUM (PORCINE) 1000 UNIT/ML IJ SOLN
INTRAMUSCULAR | Status: AC
  Administered 2020-04-06: 4000 [IU]
  Filled 2020-04-06: qty 4

## 2020-04-06 MED ORDER — POLYETHYLENE GLYCOL 3350 17 G PO PACK: 17.0000 g | PACK | Freq: Every day | ORAL | Status: AC

## 2020-04-06 NOTE — Progress Notes (Signed)
[facility] [phone]. Report given to [place]. Left name and phone number incase of questions.

## 2020-04-06 NOTE — Procedures (Signed)
I was present at this dialysis session. I have reviewed the session itself and made appropriate changes. .  Change to 3K, UF 2.5L.  Using [facility].  Hb stable.  OK For DC today.  Outpt HD set up. RS  Filed Weights   04/05/20 0530 04/06/20 0518 04/06/20 0740  Weight: (!) 100.5 kg (!) 101 kg (!) 101.8 kg    Recent Labs  Lab 04/06/20 0818  NA 138  K 3.5  CL 105  CO2 24  GLUCOSE 142*  BUN 30*  CREATININE 5.71*  CALCIUM 8.8*  PHOS 4.5    Recent Labs  Lab 03/31/20 0541 04/01/20 0815 04/02/20 0323 04/02/20 0323 04/03/20 0332 04/06/20 0700 04/06/20 0818  WBC 6.2   < > 6.5   < > 7.8 7.0 7.6  NEUTROABS 3.3  --  3.5  --  4.6  --   --   HGB 7.0*   < > 7.5*   < > 7.8* 8.5* 8.5*  HCT 22.0*   < > 23.5*   < > 24.5* 26.6* 26.7*  MCV 95.2   < > 93.6   < > 93.2 95.7 96.4  PLT 201   < > 208   < > 223 253 256   < > = values in this interval not displayed.    Scheduled Meds: . aspirin  81 mg Oral Daily  . atorvastatin  80 mg Oral Daily  . bisacodyl  10 mg Rectal Daily  . Chlorhexidine Gluconate Cloth  6 each Topical Q0600  . clopidogrel  75 mg Oral Q breakfast  . darbepoetin (ARANESP) injection - DIALYSIS  60 mcg Intravenous Q Mon-HD  . docusate sodium  100 mg Oral BID  . heparin  5,000 Units Subcutaneous Q8H  . hydrALAZINE  100 mg Oral TID  . insulin aspart  0-6 Units Subcutaneous TID WC  . isosorbide mononitrate  30 mg Oral Daily  . loratadine  10 mg Oral Daily  . metoprolol succinate  50 mg Oral Daily  . nicotine  14 mg Transdermal Daily  . sodium chloride flush  3 mL Intravenous Q12H   Continuous Infusions: . sodium chloride    . sodium chloride    . ferric gluconate (FERRLECIT/NULECIT) IV 125 mg (04/03/20 1600)   PRN Meds:.sodium chloride, sodium chloride, acetaminophen, diazepam, ondansetron (ZOFRAN) IV, promethazine, sodium chloride flush   Pearson Grippe  MD 04/06/2020, 9:41 AM

## 2020-04-06 NOTE — Progress Notes (Signed)
Renal Navigator notified by [name] that patient is showing VA benefits. Navigator met with patient at HD bedside to see if he wants VA to pay for his HD or if he wants Medicare to pay for HD. Navigator explained that currently he is approved for Medicare to pay and that [name] will need authorization. Patient states he would like to leave it with Medicare covering his OP HD. He understands that he will be discharging after HD today to [name] notified [name] of above and send discharge records to provide continuity of care.  William Schroeder, [place] Renal Navigator [phone]

## 2020-04-06 NOTE — TOC Transition Note (Addendum)
Transition of Care [facility]) - CM/SW Discharge Note   Patient Details  Name: William Schroeder MRN: [id number] Date of Birth: 23-Sep-1960  Transition of Care [facility]) CM/SW Contact:  Trula Ore, [place] Phone Number: 04/06/2020, 11:58 AM   Clinical Narrative:     Patient will DC to: [facility]   Anticipated DC date: 04/06/2020  Family notified: Marzetta Board   Transport by: Corey Harold   ?  Per MD patient ready for DC to [facility] . RN, patient, patient's family,Renal Navigator, and facility notified of DC. Discharge Summary sent to facility. RN given number for report [phone] [id number]. DC packet on chart. Ambulance transport requested for patient.  CSW signing off.  Final next level of care: Skilled Nursing Facility Barriers to Discharge: No Barriers Identified   Patient Goals and CMS Choice Patient states their goals for this hospitalization and ongoing recovery are:: to SNF CMS Medicare.gov Compare Post Acute Care list provided to:: Patient Choice offered to / list presented to : Patient  Discharge Placement              Patient chooses bed at:  [facility]) Patient to be transferred to facility by: [place] Name of family member notified: Stacy Patient and family notified of of transfer: 04/06/20  Discharge Plan and Services In-house Referral: NA Discharge Planning Services: CM Consult Post Acute Care Choice: NA          DME Arranged: N/A         HH Arranged: NA          Social Determinants of Health (SDOH) Interventions     Readmission Risk Interventions Readmission Risk Prevention Plan 03/27/2020  Transportation Screening Complete  Medication Review Press photographer) Complete  PCP or Specialist appointment within 3-5 days of discharge Complete  HRI or [place] Complete  SW Recovery Care/Counseling Consult Complete  [place] Not Applicable  Some  recent data might be hidden

## 2020-04-29 NOTE — Progress Notes (Signed)
Cardiology Office Note  Date: 04/30/2020   ID: Trey, Gulbranson [birth year], MRN [id number]  PCP:  [facility]  Cardiologist:  Carlyle Dolly, MD Electrophysiologist:  None   Chief Complaint:   History of Present Illness: William Schroeder is a 59 y.o. male with a history of HTN, CKD stage V, acute respiratory failure with hypoxia, non-STEMI, hyperkalemia, DM type II,  renal insufficiency, history of CVA.  He presented to the emergency department at [facility] 03/22/2020 for evaluation of shortness of breath.  He reported chronic dyspnea and typically slept in a recliner due to shortness of breath and back pain.  His shortness of breath was worse than usual on 03/22/2020. He was transferred to [facility]. He was admitted with NSTEMI.  Seen by cardiology and underwent right and left heart cath 03/26/2020. Had  atherectomy of LAD.  See cath report below.  He had AKI on CKD stage V / metabolic acidosis / hyperphosphatemia.  Underwent tunneled HD catheter placement on 04/03/2020.  He was seen by nephrology and recommended outpatient hemodialysis on Mondays, Wednesdays, and Fridays.  Acute combined heart failure with pulmonary edema.  See echo below.  He received a high dose Lasix initially.  Echocardiogram on 03/24/2020 demonstrated EF of 45%, anteroseptal wall hypokinetic, entire apex hypokinetic, LV demonstrated regional wall motion abnormalities, mild LVH, RV systolic function normal.  He is here status post recent hospitalization for NSTEMI, acute kidney injury on chronic CKD stage V.  Post LAD atherectomy with stent placement.  Patient states he is feeling much better since cardiac catheterization.  He is undergoing hemodialysis Mondays Wednesdays and Fridays.  He has a maturing AV fistula and left extremity which has not been used for dialysis yet.  He has a tunneled catheter in his right upper chest.  His blood pressure is considerably high.  He was recently in rehab at a skilled nursing  facility and discharged.  He states the [place] has not sent him his regular medications since he was discharged from the hospital.  Denies any anginal symptoms, exertional symptoms, palpitations or arrhythmias, orthostatic symptoms, CVA or TIA-like symptoms, PND, orthopnea, bleeding issues, claudication, DVT or PE-like symptoms, or lower extremity edema.  He is using a Rollator to ambulate with today.  He continues to smoke.  Past Medical History:  Diagnosis Date  . Arthritis   . Carpal tunnel syndrome 10/18/2014   Bilateral  . Chronic leg pain    since last back surgery  . DDD (degenerative disc disease), lumbar   . Depression   . Diabetes mellitus    Type II  . Gastroparesis   . GERD (gastroesophageal reflux disease)   . Headache   . Hyperlipidemia   . Hypertension   . Neuropathy   . Stroke [facility]) March , July 2017   right arm    Past Surgical History:  Procedure Laterality Date  . ANKLE FRACTURE SURGERY Left   . BACK SURGERY     two, last one 06/2011  . CATARACT EXTRACTION W/PHACO Right 01/28/2016   Procedure: CATARACT EXTRACTION PHACO AND INTRAOCULAR LENS PLACEMENT (IOC);  Surgeon: Tonny Branch, MD;  Location: [facility];  Service: Ophthalmology;  Laterality: Right;  CDE: 6.18  . CHOLECYSTECTOMY    . COLONOSCOPY  04/2009   Dr. Hulda Humphrey diverticulum at ascending colon  . COLONOSCOPY WITH ESOPHAGOGASTRODUODENOSCOPY (EGD)  08/18/2012   Dr. Gala Romney: TCS with few colonic diverticulosis, , EGD with short segment biopsy-proven Barrett's, gastric and bulbar erosions. Negative H.pylori.  Low-grade dysplasia of Barrett's.   . CORONARY ATHERECTOMY N/A 03/28/2020   Procedure: CORONARY ATHERECTOMY;  Surgeon: Troy Sine, MD;  Location: [place] [facility];  Service: Cardiovascular;  Laterality: N/A;  . ESOPHAGOGASTRODUODENOSCOPY  04/2009   Dr. Jearld Shines 8mg IV/Demerol 50mg IV. erosive reflux esophagitis with single patch of salmon colored mucosa at distal esophagus. bx-no definite Barrett's    . GAS/FLUID EXCHANGE Left 12/07/2018   Procedure: Gas/Fluid Exchange;  Surgeon: Jalene Mullet, MD;  Location: [place];  Service: Ophthalmology;  Laterality: Left;  . IR FLUORO GUIDE CV LINE RIGHT  04/02/2020  . IR US GUIDE VASC ACCESS RIGHT  04/02/2020  . LUMBAR FUSION  12/16/2017  . MEMBRANE PEEL Left 12/07/2018   Procedure: Antoine Primas;  Surgeon: Jalene Mullet, MD;  Location: [place];  Service: Ophthalmology;  Laterality: Left;  . PHOTOCOAGULATION WITH LASER Left 12/07/2018   Procedure: Photocoagulation With Laser;  Surgeon: Jalene Mullet, MD;  Location: [place];  Service: Ophthalmology;  Laterality: Left;  . REPAIR OF COMPLEX TRACTION RETINAL DETACHMENT Left 12/07/2018   Procedure: REPAIR OF COMPLEX TRACTION RETINAL DETACHMENT with 25g VITRECTOMY;  Surgeon: Jalene Mullet, MD;  Location: [place];  Service: Ophthalmology;  Laterality: Left;  . RIGHT/LEFT HEART CATH AND CORONARY ANGIOGRAPHY N/A 03/26/2020   Procedure: RIGHT/LEFT HEART CATH AND CORONARY ANGIOGRAPHY;  Surgeon: Troy Sine, MD;  Location: [place] CV [facility];  Service: Cardiovascular;  Laterality: N/A;  . STERIOD INJECTION  01/16/2012   Procedure: MINOR STEROID INJECTION;  Surgeon: Lowella Grip, MD;  Location: [place];  Service: Orthopedics;  Laterality: Right;  Epidural Steroid Injection  Right Lumbar 3-4 and Right Lumbar 5 - Sacral 1    Current Outpatient Medications  Medication Sig Dispense Refill  . aspirin EC 81 MG tablet Take 1 tablet (81 mg total) by mouth daily. Swallow whole.    Marland Kitchen atorvastatin (LIPITOR) 80 MG tablet Take 80 mg by mouth at bedtime.     . bisacodyl (DULCOLAX) 10 MG suppository Place 1 suppository (10 mg total) rectally daily as needed for moderate constipation.  0  . clopidogrel (PLAVIX) 75 MG tablet Take 1 tablet (75 mg total) by mouth daily with breakfast.    . ferrous gluconate (FERGON) 324 MG tablet Take 324 mg by mouth daily with breakfast.    . folic acid (FOLVITE) 1 MG  tablet Take 1 mg by mouth in the morning.    . hydrALAZINE (APRESOLINE) 100 MG tablet Take 1 tablet (100 mg total) by mouth 3 (three) times daily.    . insulin glargine (LANTUS) 100 UNIT/ML injection Inject 0.1 mLs (10 Units total) into the skin at bedtime.    . isosorbide mononitrate (IMDUR) 30 MG 24 hr tablet Take 1 tablet (30 mg total) by mouth daily.    . metoprolol succinate (TOPROL-XL) 50 MG 24 hr tablet Take 1 tablet (50 mg total) by mouth daily. Take with or immediately following a meal.    . nicotine (NICODERM CQ - DOSED IN MG/24 HOURS) 14 mg/24hr patch Place 1 patch (14 mg total) onto the skin daily.    . Omega-3 Fatty Acids (FISH OIL) 1000 MG CAPS Take 1,000 mg by mouth 2 (two) times daily.    . polyethylene glycol (MIRALAX) 17 g packet Take 17 g by mouth daily. Until bowel movement    . thiamine (VITAMIN B-1) 100 MG tablet Take 100 mg by mouth in the morning.    . traZODone (DESYREL) 50 MG tablet Take 1 tablet (50  mg total) by mouth at bedtime as needed for sleep.     No current facility-administered medications for this visit.   Allergies:  Patient has no known allergies.   Social History: The patient  reports that he has been smoking cigarettes. He has a 20.00 pack-year smoking history. His smokeless tobacco use includes snuff. He reports that he does not drink alcohol and does not use drugs.   Family History: The patient's family history includes Alcohol abuse in his brother; Colon cancer in an other family member; Depression in his brother and sister; Diabetes in his mother and another family member; Drug abuse in his brother; Heart disease in his mother; Kidney disease in his mother; Pancreatic cancer in his brother and sister.   ROS:  Please see the history of present illness. Otherwise, complete review of systems is positive for none.  All other systems are reviewed and negative.   Physical Exam: VS:  BP (!) 198/88   Pulse 98   Ht 5\' 10"  (1.778 m)   Wt 212 lb (96.2 kg)    SpO2 98%   BMI 30.42 kg/m , BMI Body mass index is 30.42 kg/m.  Wt Readings from Last 3 Encounters:  04/30/20 212 lb (96.2 kg)  04/06/20 (!) 218 lb 14.7 oz (99.3 kg)  12/07/18 235 lb (106.6 kg)    General: Patient appears comfortable at rest. Neck: Supple, no elevated JVP or carotid bruits, no thyromegaly. Lungs: Clear to auscultation, nonlabored breathing at rest. Cardiac: Regular rate and rhythm, no S3 or significant systolic murmur, no pericardial rub. Abdomen: Soft, nontender, no hepatomegaly, bowel sounds present, no guarding or rebound. Extremities: No pitting edema, distal pulses 2+. Skin: Warm and dry. Musculoskeletal: No kyphosis. Neuropsychiatric: Alert and oriented x3, affect grossly appropriate.  ECG:  03/28/2020 EKG shows sinus rhythm nonspecific T wave abnormality, prolonged QT rate of 74.  Recent Labwork: 04/03/2020: Magnesium 2.1 04/06/2020: BUN 30; Creatinine, Ser 5.71; Hemoglobin 8.5; Platelets 256; Potassium 3.5; Sodium 138     Component Value Date/Time   CHOL 180 03/26/2020 1255   TRIG 189 (H) 03/26/2020 1255   HDL 27 (L) 03/26/2020 1255   CHOLHDL 6.7 03/26/2020 1255   VLDL 38 03/26/2020 1255   LDLCALC 115 (H) 03/26/2020 1255    Other Studies Reviewed Today:  03/28/2020 CORONARY ATHERECTOMY  Conclusion    1st Diag lesion is 60% stenosed.  Mid LAD lesion is 50% stenosed.  Prox LAD lesion is 95% stenosed.  2nd Mrg-1 lesion is 20% stenosed.  2nd Mrg-2 lesion is 80% stenosed.  LPDA-1 lesion is 30% stenosed.  LPDA-2 lesion is 80% stenosed.  Post intervention, there is a 0% residual stenosis.  A stent was successfully placed.   Successful complex coronary intervention with diamondback orbital atherectomy and ultimate DES stenting of the 95% severely calcified proximal LAD stenosis with ultimate insertion of a 2.75 x 15 mm Resolute Onyx stent postdilated to 3.0 mm.  The 95% stenosis was reduced to 0%.  There was brisk TIMI-3 flow.  There was no  evidence for dissection.  RECOMMENDATION: The patient will continue with gentle hydration post procedure with close follow-up of renal function.  Continue DAPT for minimum of 1 year.  Diagnostic Dominance: Left  Intervention            Right and left heart catheterization 03/26/2020 Conclusion    2nd Mrg-1 lesion is 20% stenosed.  2nd Mrg-2 lesion is 80% stenosed.  LPDA-1 lesion is 30% stenosed.  LPDA-2 lesion is 80% stenosed.  1st Diag lesion is 30% stenosed.  Mid LAD lesion is 30% stenosed.  Prox LAD lesion is 90% stenosed.   Coronary calcification with a 90% calcified proximal LAD stenosis, 30% first diagonal 30% LAD stenosis after the diagonal takeoff; dominant left circumflex coronary artery with calcification in 20 and 80% circumflex marginal stenoses, 30 and 80% PDA stenosis; small nondominant RCA.  Mildly elevated right heart pressure with PA pressure at 34/19; mean PA pressure 24 mm.  Contrast used: 17 cc  RECOMMENDATION: We will need to monitor renal function post catheterization.  Patient will need intervention to his proximal LAD once stable from a renal standpoint.  Aggressive lipid-lowering therapy.      Echocardiogram 03/25/2020  1. The anteroseptal wall is hypokinetic. The entire apex is hypokinetic. Marland Kitchen Left ventricular ejection fraction, by estimation, is 45%. The left ventricle has mildly decreased function. The left ventricle demonstrates regional wall motion abnormalities (see scoring diagram/findings for description). There is mild left ventricular hypertrophy. Left ventricular diastolic parameters are indeterminate. 2. Right ventricular systolic function is normal. The right ventricular size is normal. 3. The mitral valve is normal in structure. No evidence of mitral valve regurgitation. No evidence of mitral stenosis. 4. The aortic valve is tricuspid. Aortic valve regurgitation is not visualized. No aortic stenosis  is Present.   Assessment and Plan:  1. Non-STEMI (non-ST elevated myocardial infarction) ([place])   2. CAD in native artery   3. Acute on chronic combined systolic (congestive) and diastolic (congestive) heart failure ([place])   4. CKD (chronic kidney disease) stage V requiring chronic dialysis ([place])   5. Essential hypertension   6. Mixed hyperlipidemia   7. Tobacco abuse   8. Anemia of chronic disease    1. Non-STEMI (non-ST elevated myocardial infarction) ([place]) Recent non-STEMI treated 95% LAD lesion treated with orbital atherectomy and stent placement.  Denies any anginal or exertional symptoms today.  Continue aspirin 81 mg daily, Plavix 75 mg daily, Imdur 30 mg daily, Toprol-XL 50 mg daily.  2. CAD in native artery Recent cardiac catheterization on 03/26/2020: Second marginal 1 lesion 20%, Second marginal 2 lesion 80%, L PDA 1 lesion 30% L PDA 2 lesion 80%,First diagonal lesion 30%,Mid LAD lesion 30%,Proximal LAD lesion 95%, atherectomy to proximal LAD lesion with stent placement.  Patient denies any anginal or exertional symptoms today.  Continue aspirin 81 mg daily, Plavix 75 mg daily, Imdur 30 mg daily, Toprol-XL 50 mg daily.  3. Acute on chronic combined systolic (congestive) and diastolic (congestive) heart failure ([place]) Echo on 03/26/2020 showed EF of 45%, hypokinetic anteroseptal wall, entire apex is hypokinetic.  LV with R WMA's.  Mild LVH.   4. CKD (chronic kidney disease) stage V requiring chronic dialysis ([place]) Tunneled hemodialysis catheter was placed and patient was to have outpatient hemodialysis on Mondays, Wednesdays, and Fridays.  Follows with nephrology.  5. Essential hypertension Blood pressure significantly elevated today.  Blood pressure 198/88.  Patient states he is run out of all of his medications.  He gets all of his medications from the [place].  We will send a 30-day supply of all his cardiac medications to the local [facility] until the [place] feels his medications.   Advised him to check his blood pressure every day and call us back in 2 weeks with blood pressures. Continue hydralazine 100 mg p.o. 3 times daily, Toprol-XL 50 mg daily.  6. Mixed hyperlipidemia Continue atorvastatin 80 mg daily.  Lipid panel on 03/26/2020 showed TC 180, TG 189, HDL 27, LDL  115.  7. Tobacco abuse Patient was given a nicotine patch prior to discharge.  He admits to continued smoking today.  Advised cessation.  Continue nicotine patch.  8. Anemia of chronic disease CBC on 04/06/2020 showed hemoglobin of 8.5 and hematocrit of 26.7.  Please get a follow-up CBC to assess anemia.  Medication Adjustments/Labs and Tests Ordered: Current medicines are reviewed at length with the patient today.  Concerns regarding medicines are outlined above.   Disposition: Follow-up with Dr. Harl Bowie or APP 3 months  Signed, Levell July, NP 04/30/2020 10:05 AM    [facility] [place], [place], [place] [address] Phone: [phone]; Fax: [phone]

## 2020-04-30 ENCOUNTER — Encounter: Payer: Self-pay | Admitting: Family Medicine

## 2020-04-30 ENCOUNTER — Ambulatory Visit (INDEPENDENT_AMBULATORY_CARE_PROVIDER_SITE_OTHER): Payer: Medicare Other | Admitting: Family Medicine

## 2020-04-30 ENCOUNTER — Other Ambulatory Visit: Payer: Self-pay

## 2020-04-30 VITALS — BP 198/88 | HR 98 | Ht 70.0 in | Wt 212.0 lb

## 2020-04-30 DIAGNOSIS — Z992 Dependence on renal dialysis: Secondary | ICD-10-CM

## 2020-04-30 DIAGNOSIS — I2510 Atherosclerotic heart disease of native coronary artery without angina pectoris: Secondary | ICD-10-CM | POA: Diagnosis not present

## 2020-04-30 DIAGNOSIS — I5043 Acute on chronic combined systolic (congestive) and diastolic (congestive) heart failure: Secondary | ICD-10-CM

## 2020-04-30 DIAGNOSIS — Z720 Tobacco use: Secondary | ICD-10-CM

## 2020-04-30 DIAGNOSIS — N186 End stage renal disease: Secondary | ICD-10-CM | POA: Diagnosis not present

## 2020-04-30 DIAGNOSIS — I10 Essential (primary) hypertension: Secondary | ICD-10-CM

## 2020-04-30 DIAGNOSIS — E782 Mixed hyperlipidemia: Secondary | ICD-10-CM

## 2020-04-30 DIAGNOSIS — D638 Anemia in other chronic diseases classified elsewhere: Secondary | ICD-10-CM

## 2020-04-30 DIAGNOSIS — I214 Non-ST elevation (NSTEMI) myocardial infarction: Secondary | ICD-10-CM

## 2020-04-30 MED ORDER — METOPROLOL SUCCINATE ER 50 MG PO TB24: 50.0000 mg | ORAL_TABLET | Freq: Every day | ORAL | 6 refills | Status: AC

## 2020-04-30 MED ORDER — HYDRALAZINE HCL 100 MG PO TABS: 100.0000 mg | ORAL_TABLET | Freq: Three times a day (TID) | ORAL | 6 refills | Status: AC

## 2020-04-30 MED ORDER — CLOPIDOGREL BISULFATE 75 MG PO TABS: 75.0000 mg | ORAL_TABLET | Freq: Every day | ORAL | 6 refills | Status: AC

## 2020-04-30 MED ORDER — ISOSORBIDE MONONITRATE ER 30 MG PO TB24: 30.0000 mg | ORAL_TABLET | Freq: Every day | ORAL | 6 refills | Status: AC

## 2020-04-30 MED ORDER — ATORVASTATIN CALCIUM 80 MG PO TABS: 80.0000 mg | ORAL_TABLET | Freq: Every day | ORAL | 6 refills | Status: AC

## 2020-04-30 NOTE — Patient Instructions (Signed)
Medication Instructions:  Continue all current medications.  Labwork:  BMET, CBC - orders given today.  Office will contact with results via phone or letter.    Testing/Procedures: none  Follow-Up: 3 months   Any Other Special Instructions Will Be Listed Below (If Applicable).  If you need a refill on your cardiac medications before your next appointment, please call your pharmacy.

## 2020-05-17 ENCOUNTER — Ambulatory Visit: Payer: Medicare Other | Admitting: General Practice

## 2020-06-12 ENCOUNTER — Telehealth: Payer: Self-pay | Admitting: *Deleted

## 2020-06-12 NOTE — Telephone Encounter (Signed)
-----   Message from Verta Ellen., NP sent at 06/10/2020 10:10 PM EDT ----- Hemoglobin and Hematocrit have improved. 11.2 and 35.3.  Creatinine is 4.8. He is on dialysis.

## 2020-06-12 NOTE — Telephone Encounter (Signed)
Patient's daughter informed. Copy sent to PCP 

## 2020-08-07 ENCOUNTER — Ambulatory Visit: Payer: Medicare Other | Admitting: Family Medicine

## 2020-08-08 DEATH — deceased

## 2022-03-20 IMAGING — US US RENAL
1 series · 14 of 25 positions shown · non-contrast
Comparison: 03/31/2019, 02/04/2018

CLINICAL DATA: Chronic renal disease, possible obstruction

EXAM:
RENAL / URINARY TRACT ULTRASOUND COMPLETE

[Series 1: us renal · 0.26mm/px · 14 of 80 slices shown]
[im 1/80]
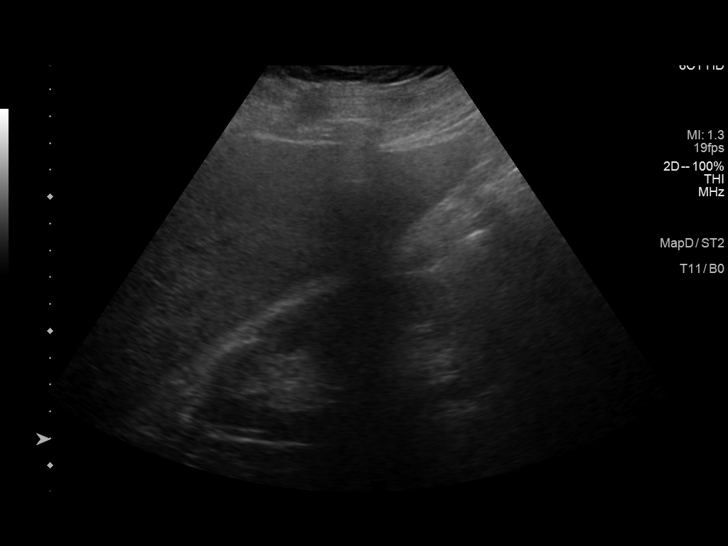
[im 7/80]
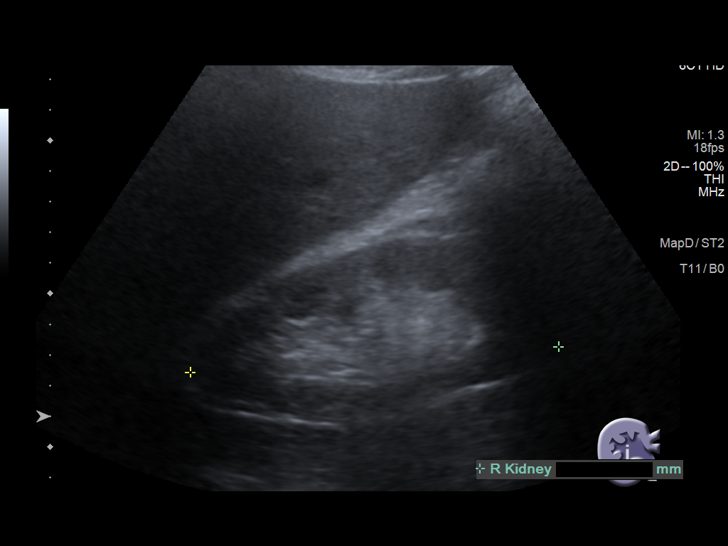
[im 14/80]
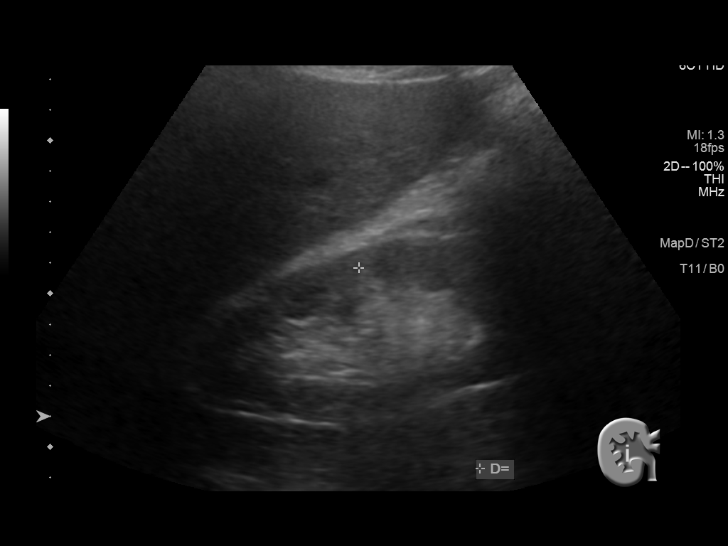
[im 20/80]
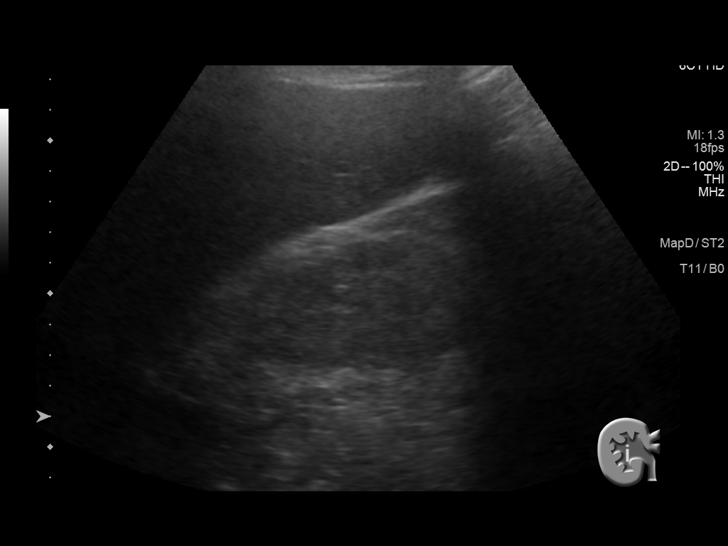
[im 27/80]
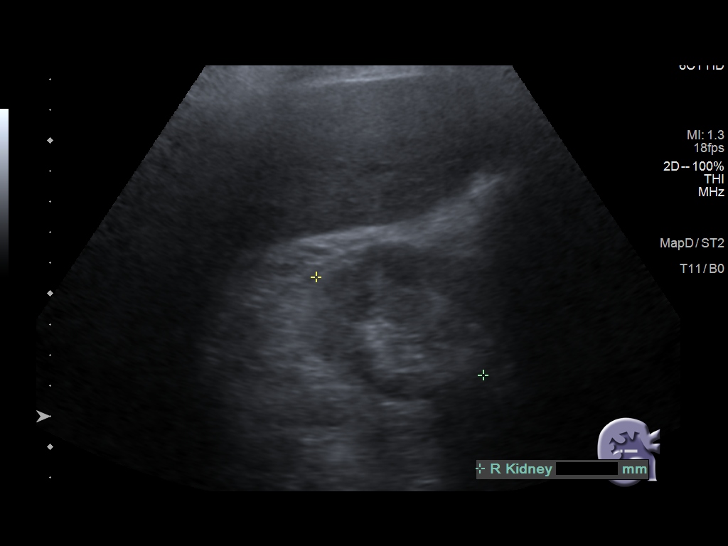
[im 30/80]
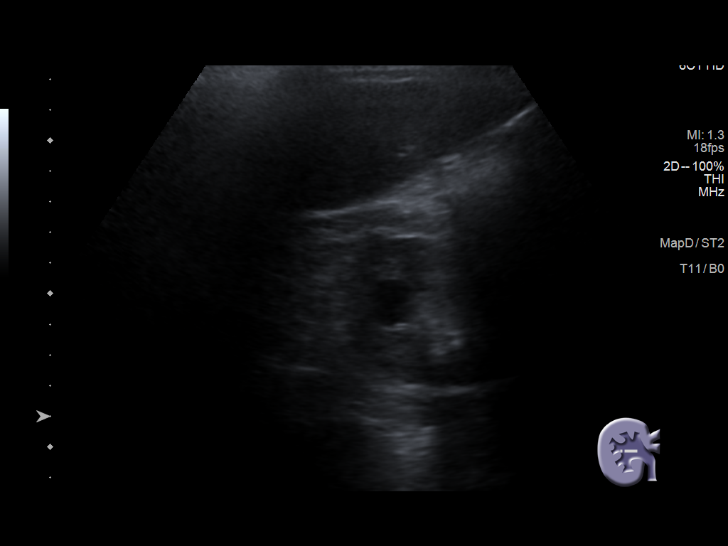
[im 37/80]
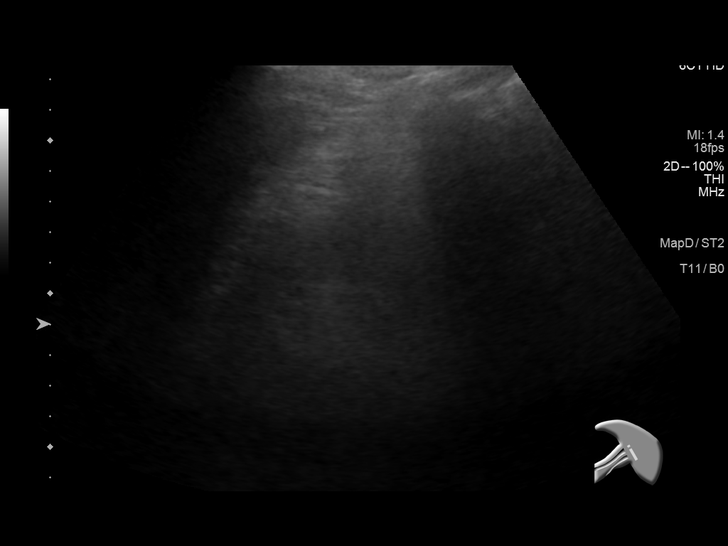
[im 43/80]
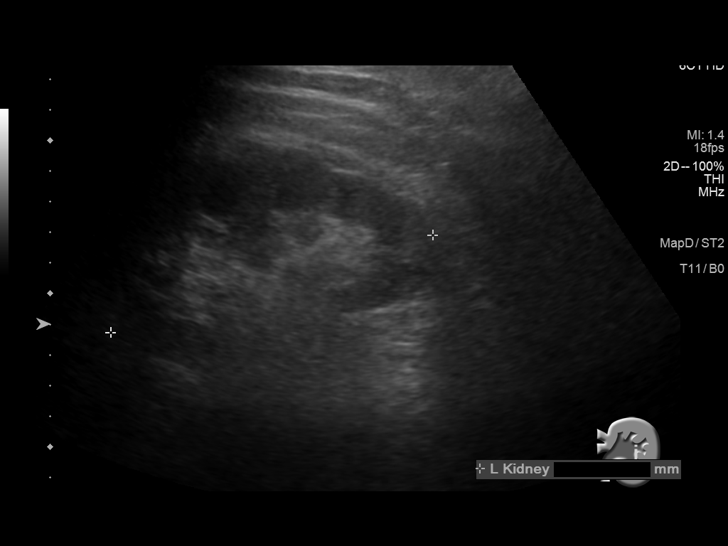
[im 50/80]
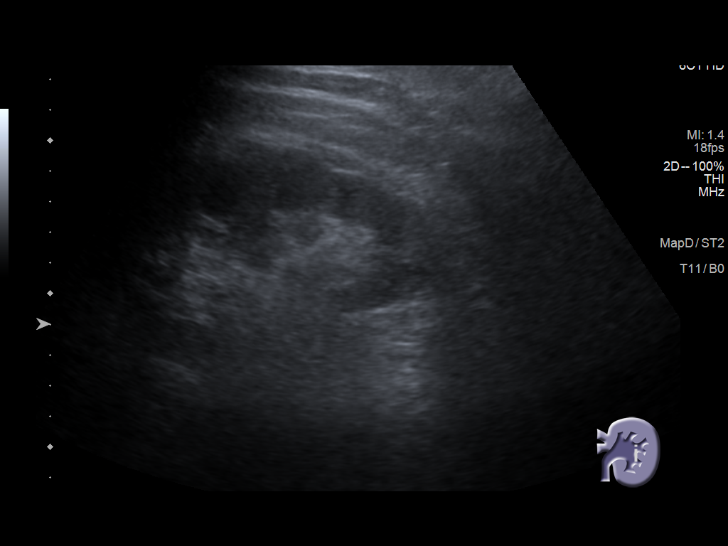
[im 53/80]
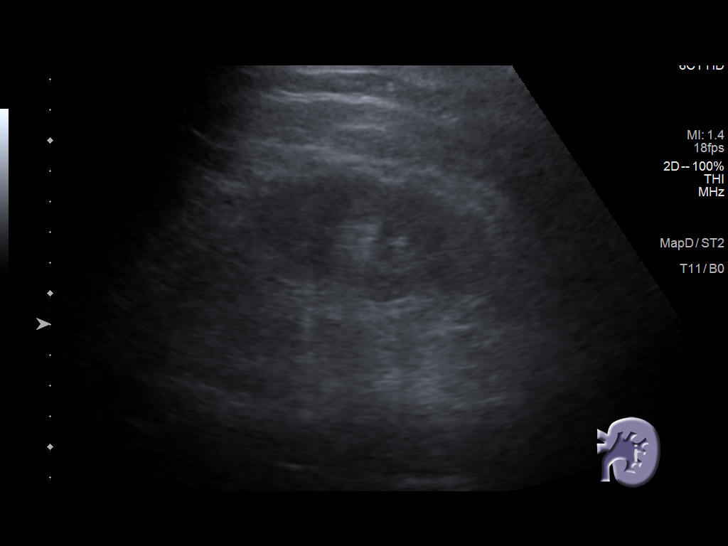
[im 60/80]
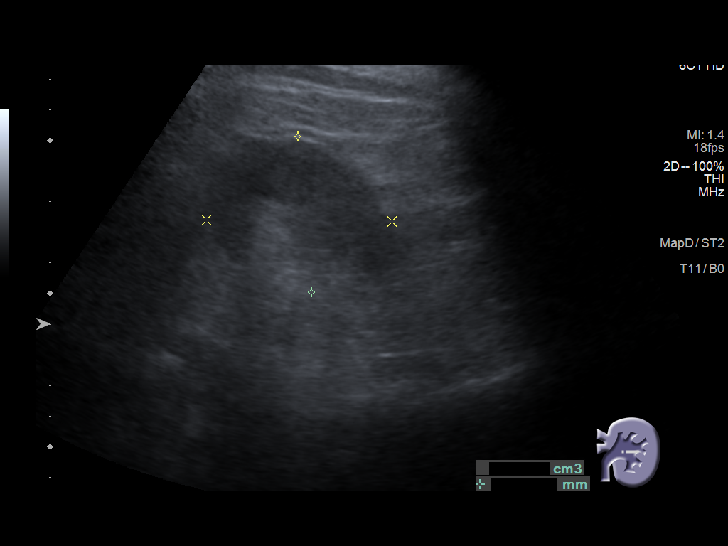
[im 66/80]
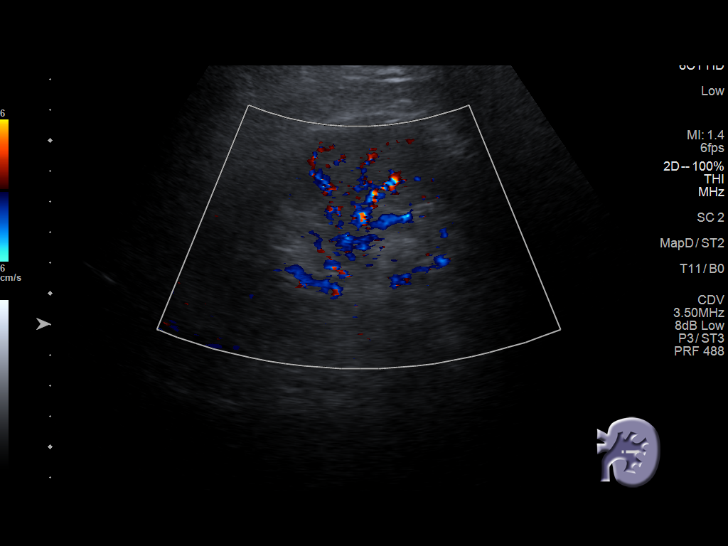
[im 73/80]
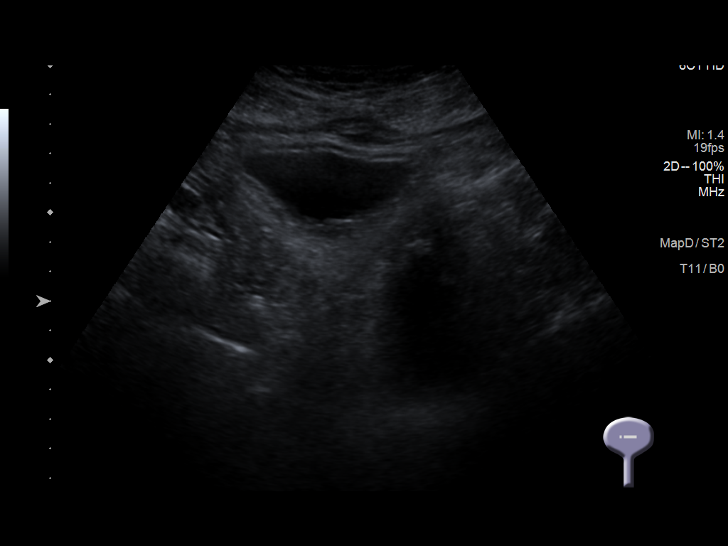
[im 80/80]
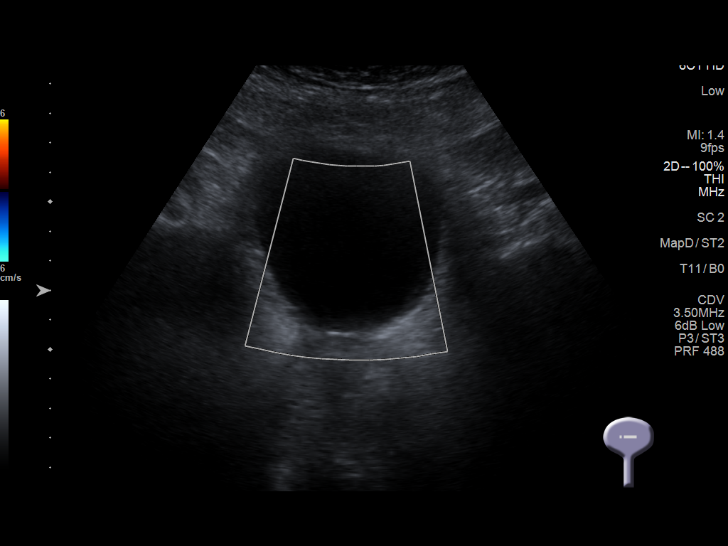

[14 of 25 positions shown; findings below may reference images not displayed]

FINDINGS: Right Kidney:

Renal measurements: 12.0 x 5.4 x 6.3 cm. = volume: 218 mL. 1.7 cm
cyst is noted within the lower pole of the right kidney this was not
well appreciated on the prior noncontrast study but was visualized
on a prior contrast study from 6081.

Left Kidney:

Renal measurements: 11.2 x 6.0 x 5.1 cm. = volume: 182 mL.
Echogenicity within normal limits. No mass or hydronephrosis
visualized.

Bladder:

Appears normal for degree of bladder distention.

Other:

None.
IMPRESSION: Simple cyst is noted within the right kidney.

No other focal abnormality is noted.

## 2022-07-06 IMAGING — XA IR FLUORO GUIDE CV LINE*R*
1 series · 1 of 1 positions shown · non-contrast
Comparison: none

INDICATION: 59-year-old male with a history of renal dysfunction referred
tunneled hemodialysis catheter

[Series 1: fl (-) angio · 1 of 1 slices shown]
[im 1/1]
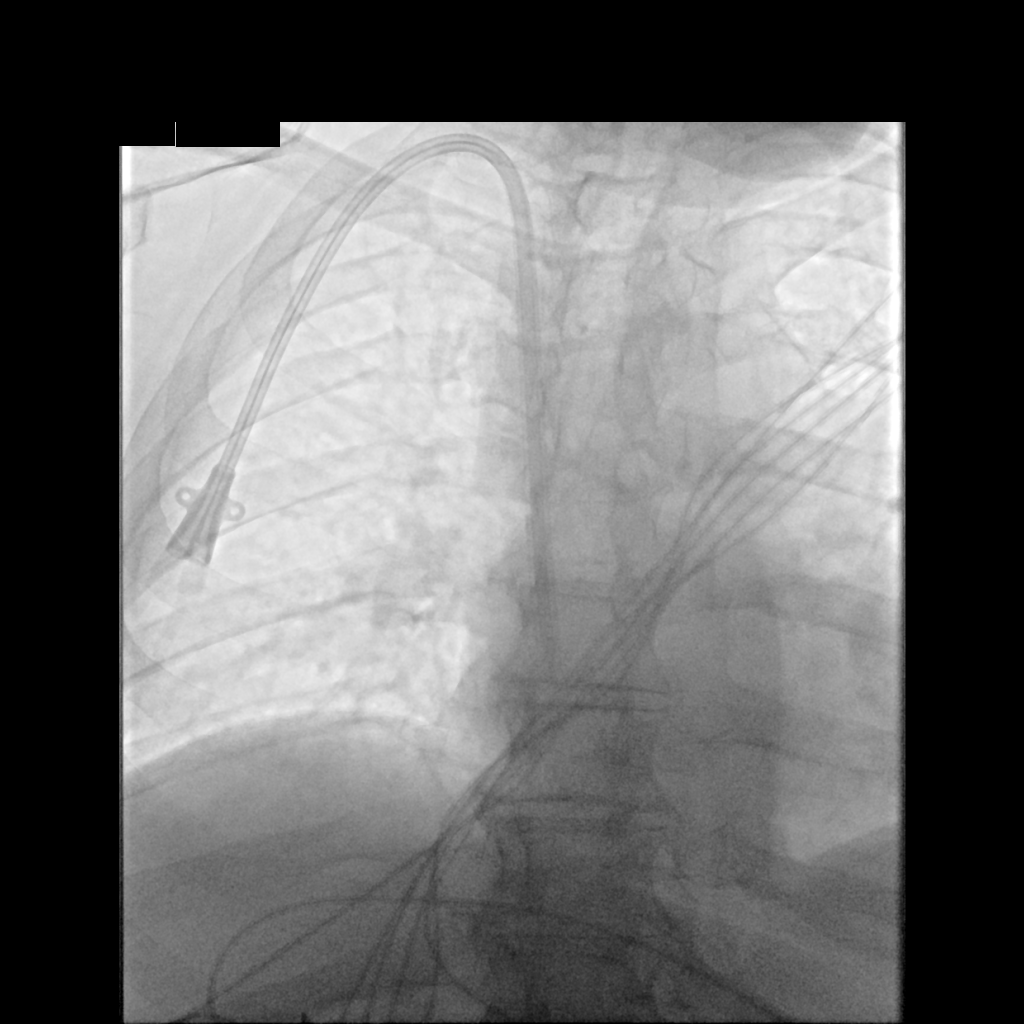

[1 of 1 positions shown; findings below may reference images not displayed]

EXAM:
TUNNELED CENTRAL VENOUS HEMODIALYSIS CATHETER PLACEMENT WITH
ULTRASOUND AND FLUOROSCOPIC GUIDANCE

MEDICATIONS:
2 g Ancef. The antibiotic was given in an appropriate time interval
prior to skin puncture.

ANESTHESIA/SEDATION:
Moderate (conscious) sedation was employed during this procedure. A
total of Versed 1.0 mg and Fentanyl 50 mcg was administered
intravenously.

Moderate Sedation Time: 19 minutes. The patient's level of
consciousness and vital signs were monitored continuously by
radiology nursing throughout the procedure under my direct
supervision.

FLUOROSCOPY TIME:  Fluoroscopy Time: 0 minutes 30 seconds (7 mGy).

COMPLICATIONS:
None



Ultrasound survey was performed.

Micropuncture kit was utilized to access the right internal jugular
vein under direct, real-time ultrasound guidance after the overlying
soft tissues were anesthetized with 1% lidocaine with epinephrine.
Stab incision was made with 11 blade scalpel. Microwire was passed
centrally. The microwire was then marked to measure appropriate
internal catheter length. External tunneled length was estimated. A
total tip to cuff length of 23 cm was selected.

035 guidewire was advanced to the level of the IVC.

Skin and subcutaneous tissues of chest wall below the clavicle were
generously infiltrated with 1% lidocaine for local anesthesia. A
small stab incision was made with 11 blade scalpel. The selected
hemodialysis catheter was tunneled in a retrograde fashion from the
anterior chest wall to the venotomy incision.

Serial dilation was performed and then a peel-away sheath was
placed.

The catheter was then placed through the peel-away sheath with tips
ultimately positioned within the superior aspect of the right
atrium. Final catheter positioning was confirmed and documented with
a spot radiographic image. The catheter aspirates and flushes
normally. The catheter was flushed with appropriate volume heparin
dwells.

The catheter exit site was secured with a 0-Prolene retention
suture. Gel-Foam slurry was infused into the soft tissue tract.

There was some oozing at the skin entry site at the subcutaneous
tract. Additional manual pressure was used, as well as an additional
0 Prolene suture, which can be removed in 5-7 days at dialysis.

The venotomy incision was closed Derma bond and sterile dressing.
Dressings were applied at the chest wall.

Patient tolerated the procedure well and remained hemodynamically
stable throughout.

No complications were encountered and no significant blood loss
encountered.
IMPRESSION: Status post right IJ tunneled hemodialysis catheter placement.
Catheter ready for use.
